# Patient Record
Sex: Female | Born: 1940
Health system: Southern US, Community
[De-identification: ages and names within clinical notes are randomized; demographics above are authoritative.]

## PROBLEM LIST (undated history)

## (undated) DIAGNOSIS — E7800 Pure hypercholesterolemia, unspecified: Secondary | ICD-10-CM

## (undated) DIAGNOSIS — I10 Essential (primary) hypertension: Secondary | ICD-10-CM

## (undated) DIAGNOSIS — E119 Type 2 diabetes mellitus without complications: Secondary | ICD-10-CM

## (undated) HISTORY — DX: Pure hypercholesterolemia, unspecified: E78.00

---

## 2005-12-03 ENCOUNTER — Emergency Department (HOSPITAL_COMMUNITY): Admission: EM | Admit: 2005-12-03 | Discharge: 2005-12-03 | Payer: Self-pay | Admitting: Emergency Medicine

## 2005-12-03 IMAGING — CR DG CHEST 1V PORT
1 series · 1 of 1 positions shown · non-contrast
Comparison: None

CLINICAL DATA: Chest pain, shortness of breath

PORTABLE CHEST - 1 VIEW:

[view not recorded]
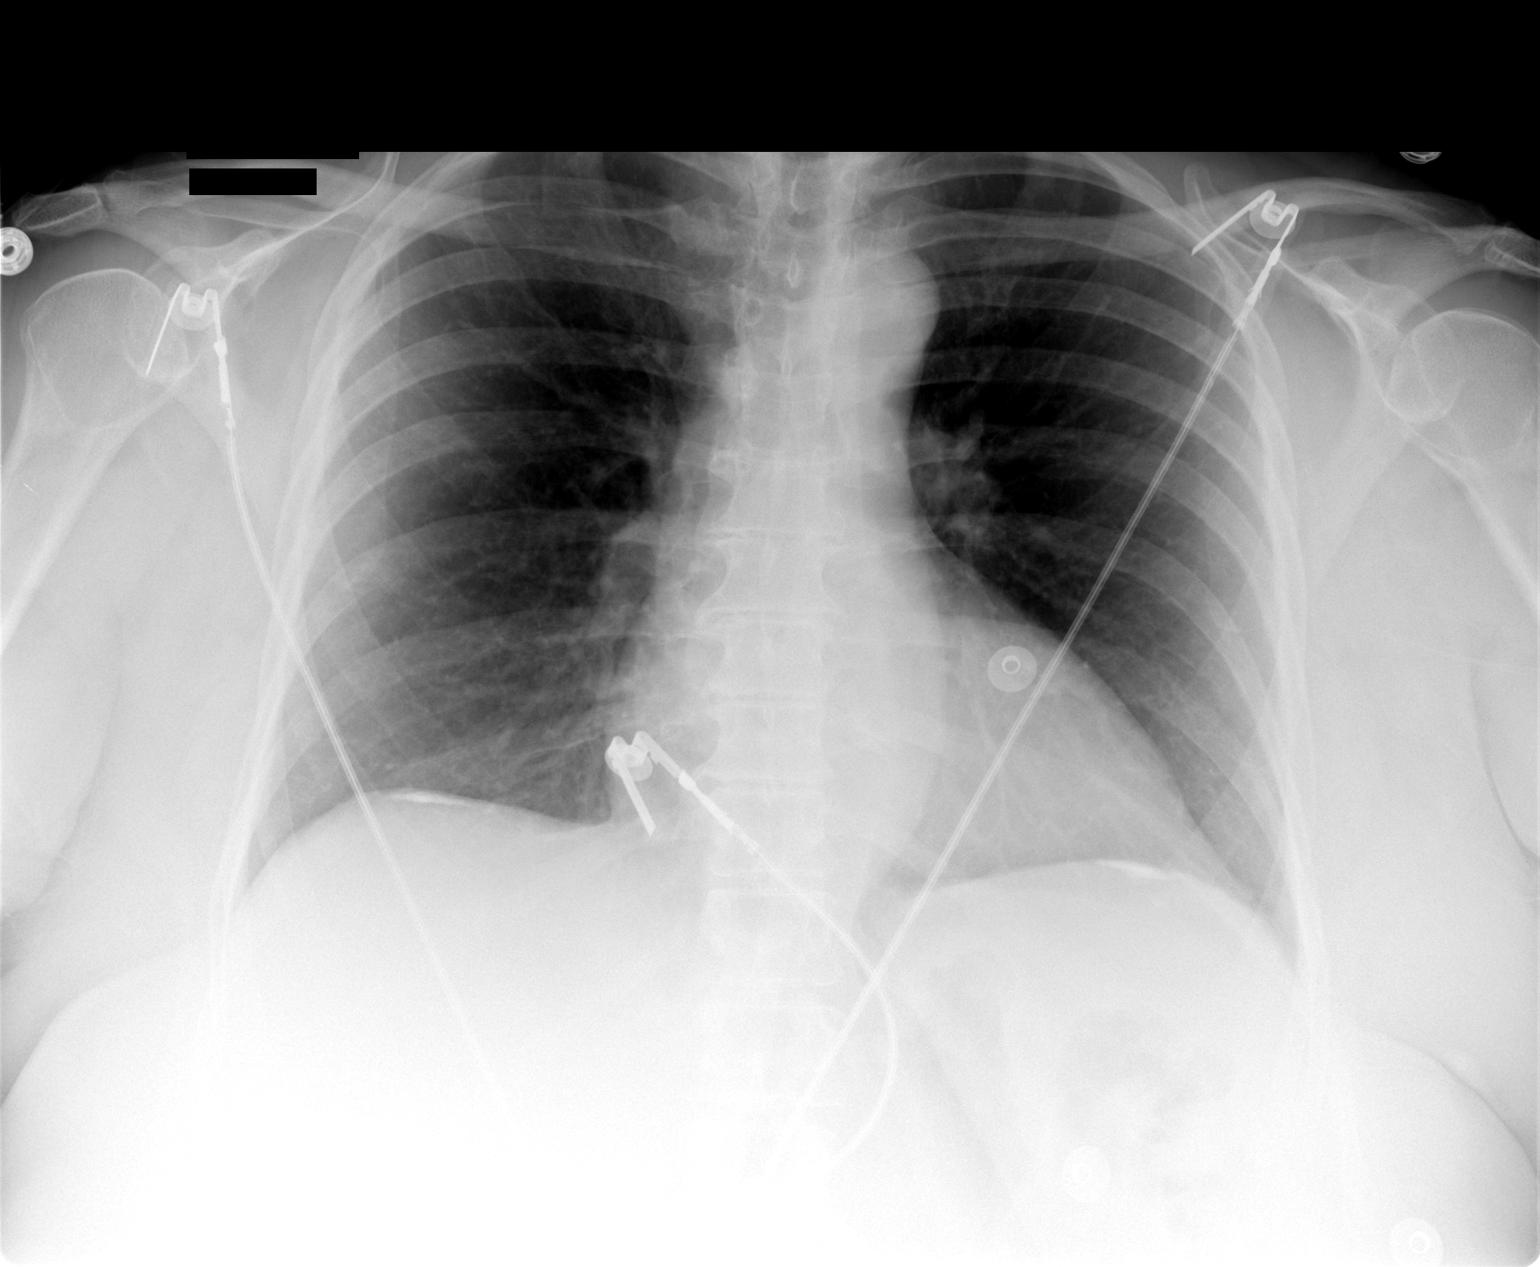

[1 of 1 positions shown; findings below may reference images not displayed]

FINDINGS: Heart and mediastinal contours are within normal limits. Calcified
pleural plaques noted along the hemidiaphragms bilaterally suggest the
possibility of prior asbestos exposure. Lungs are clear. No effusions.
IMPRESSION: Calcified pleural plaques bilaterally suggesting asbestos related pleural
disease. No acute findings.

## 2005-12-16 ENCOUNTER — Ambulatory Visit: Payer: Self-pay | Admitting: Internal Medicine

## 2006-01-13 ENCOUNTER — Ambulatory Visit: Payer: Self-pay | Admitting: Internal Medicine

## 2006-01-27 ENCOUNTER — Ambulatory Visit: Payer: Self-pay | Admitting: Cardiology

## 2006-01-27 IMAGING — CT CT CHEST W/ CM
2 of 4 series · 15 of 36 positions shown, 18 images · IV contrast (omnipaque)
Comparison: none

CLINICAL DATA: Asbestos exposure.  Difficulty breathing.  
 CHEST CT WITH CONTRAST:
TECHNIQUE: Multidetector CT imaging of the chest was performed following the standard protocol during bolus administration of intravenous contrast.
 Contrast:  80 cc Omnipaque 300 injected at 3 cc/second.

[Series 2: chest_rout_xxl 5.0 b31f st · axial · 0.72mm/px · z∈[-252,-46]mm · 12 of 49 slices shown, 15 images]
[im 4/49  mediastinal]
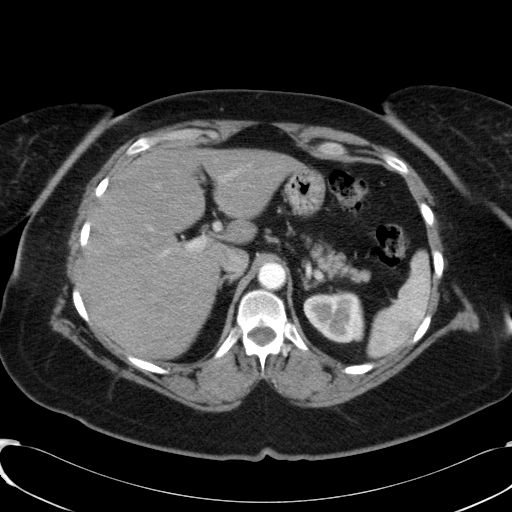
[im 4/49  lung]
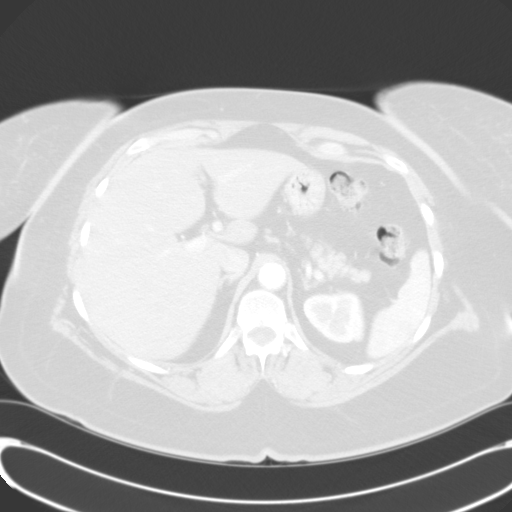
[im 8/49  lung]
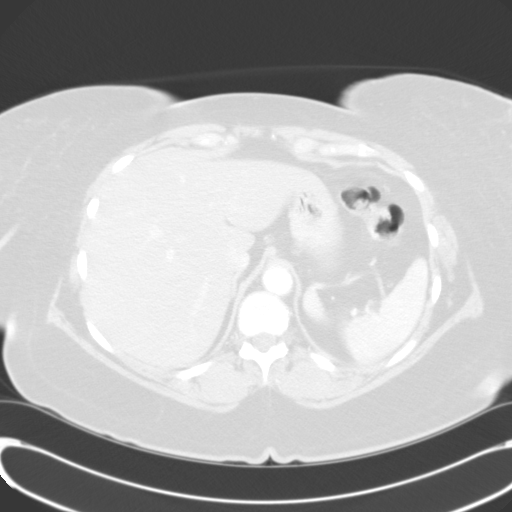
[im 12/49  lung]
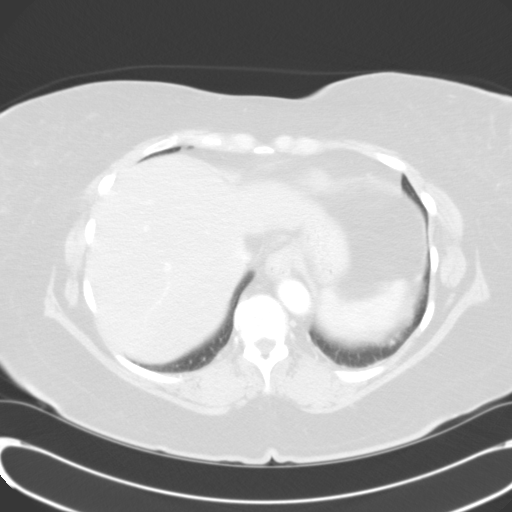
[im 15/49  lung]
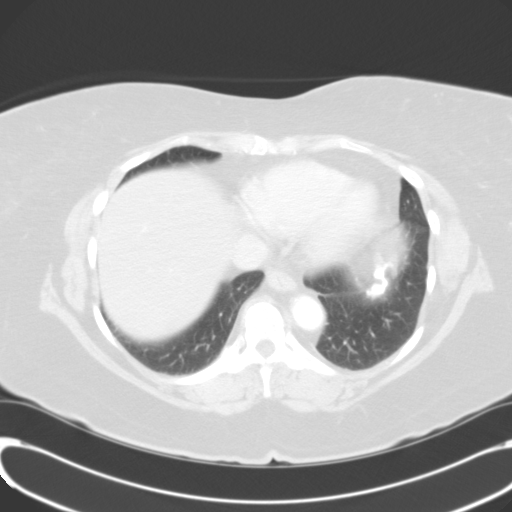
[im 19/49  mediastinal]
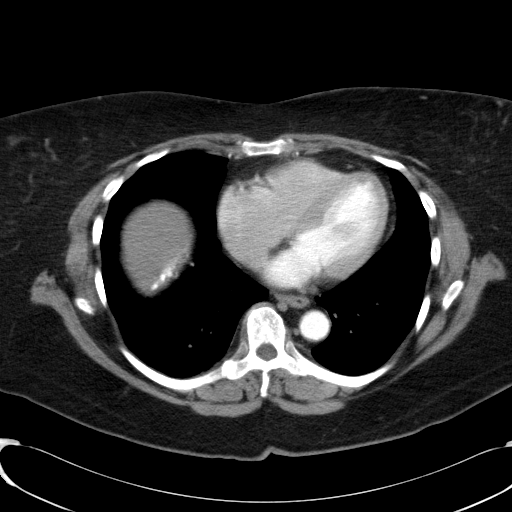
[im 19/49  lung]
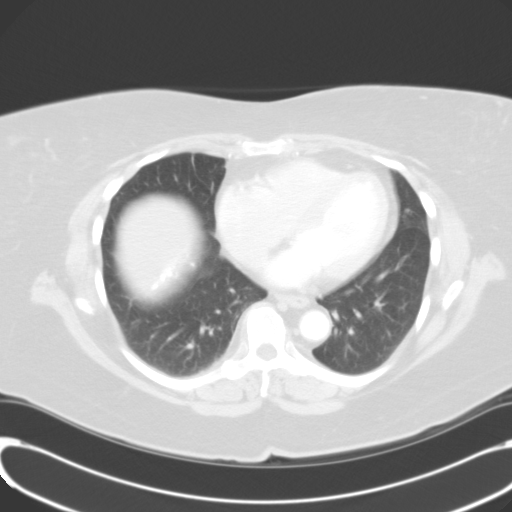
[im 23/49  lung]
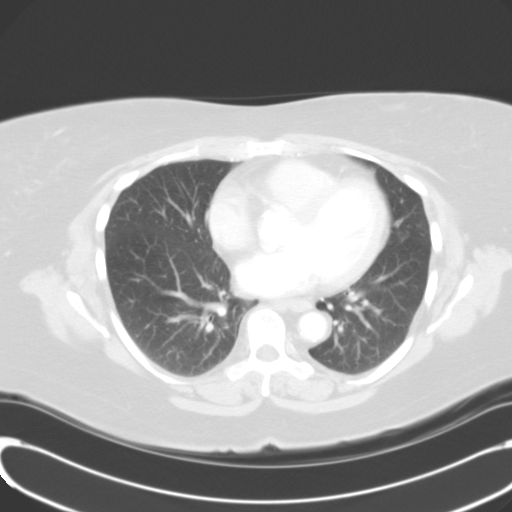
[im 26/49  lung]
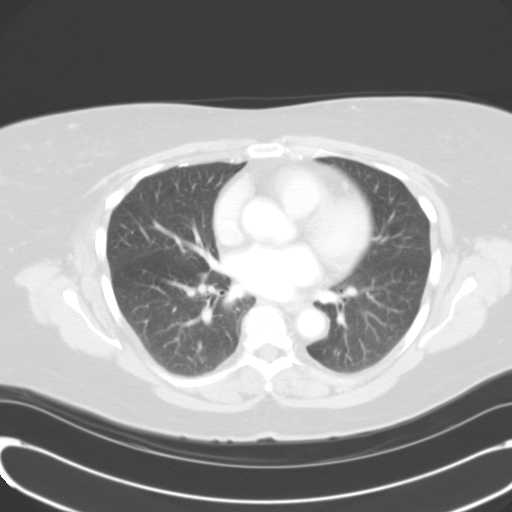
[im 30/49  lung]
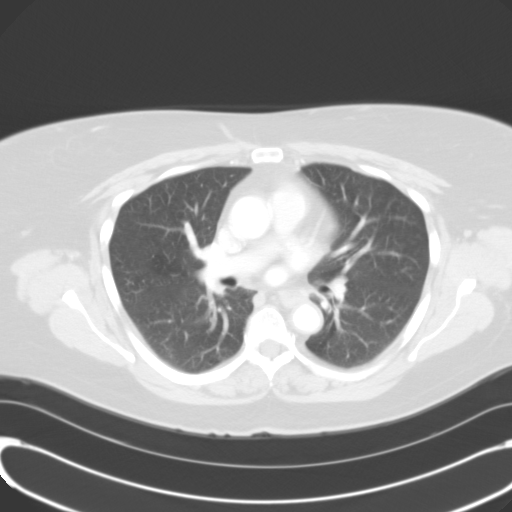
[im 34/49  mediastinal]
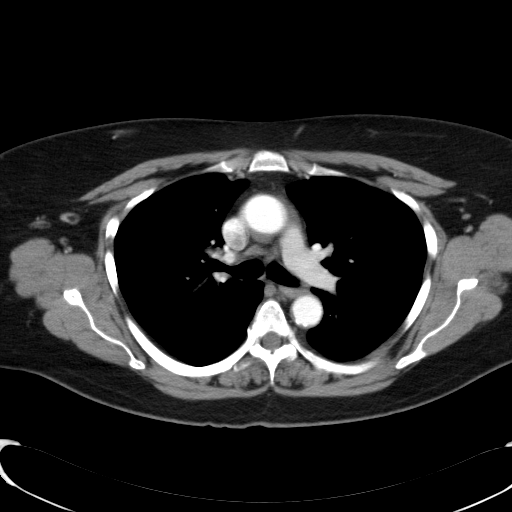
[im 34/49  lung]
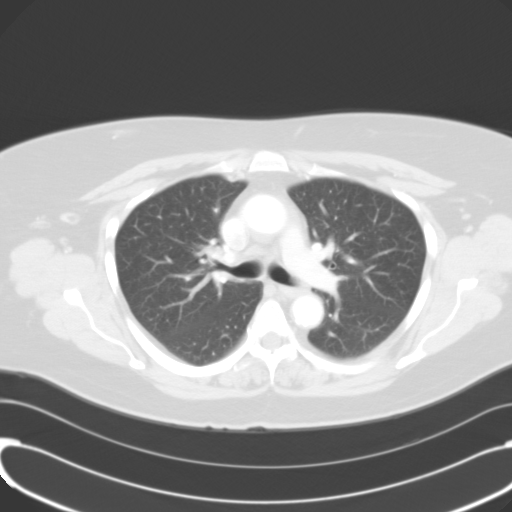
[im 37/49  lung]
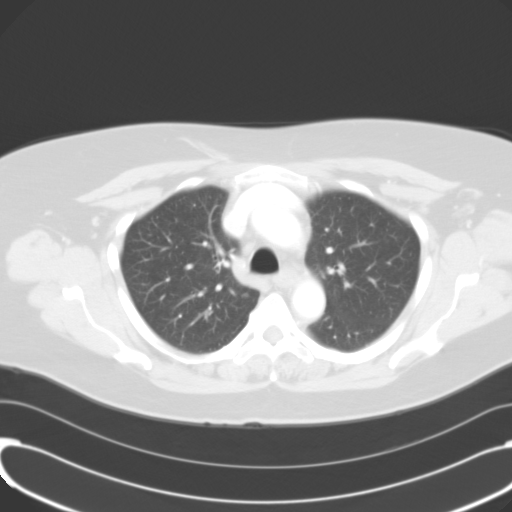
[im 41/49  lung]
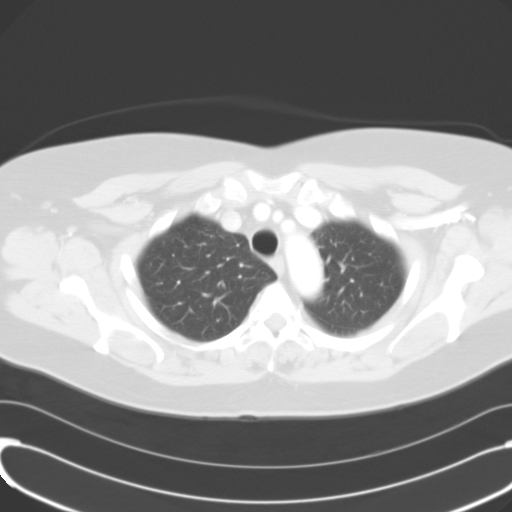
[im 45/49  lung]
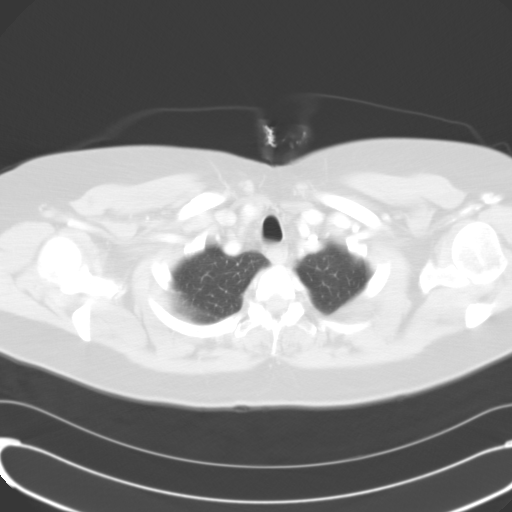

[Series 602: <mpr range> · coronal · 0.72mm/px · 3 of 39 slices shown]
[im 8/39  lung]
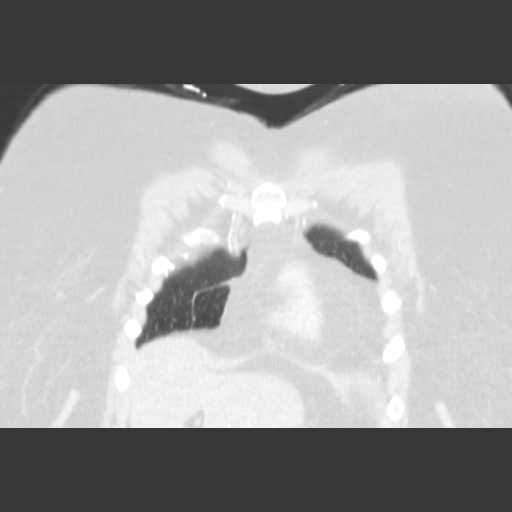
[im 16/39  lung]
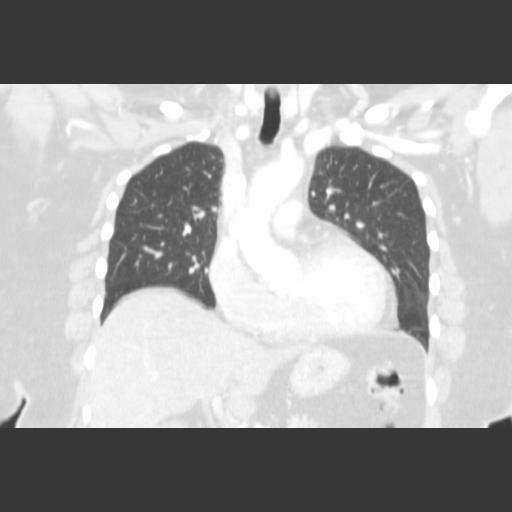
[im 23/39  lung]
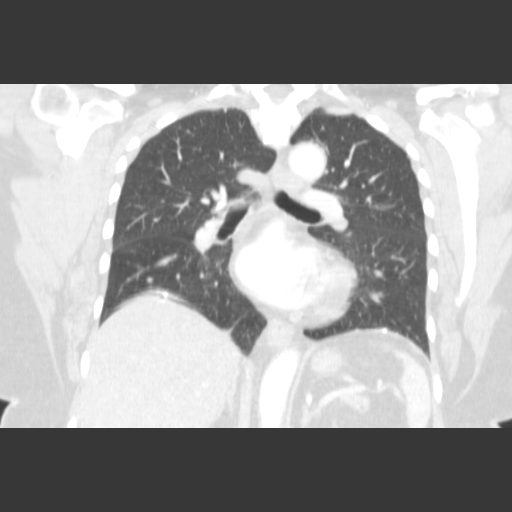

[15 of 36 positions shown; findings below may reference images not displayed]

FINDINGS: There are bibasilar partially calcified pleural plaques noted.  Interstitial fibrosis is not present.  There is no mediastinal or hilar adenopathy and there is no evidence for parenchymal pulmonary mass or infiltrate.  There are no pleural effusions.  The central airways are patent.  The chest wall structures and adrenal glands have normal appearance.
IMPRESSION: Bibasilar partially calcified pleural plaques consistent with previous asbestos exposure.  Otherwise, negative study.

## 2007-03-23 ENCOUNTER — Ambulatory Visit (HOSPITAL_COMMUNITY): Admission: RE | Admit: 2007-03-23 | Discharge: 2007-03-23 | Payer: Self-pay | Admitting: Cardiology

## 2007-03-23 IMAGING — CT CT ANGIO ABDOMEN
1 of 6 series · 14 of 46 positions shown, 19 images · IV contrast (APPLIED)
Comparison: none

CLINICAL DATA: Hypertension. Premedicated due to remote history of contrast
allergy

[Series 5: abd cta 2.0 (id) · axial · 0.79mm/px · z∈[-314,-118]mm · 14 of 112 slices shown, 19 images]
[im 7/112  soft-tissue]
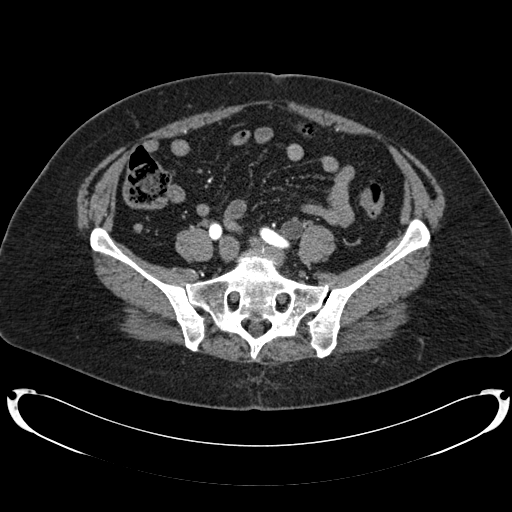
[im 7/112  bone]
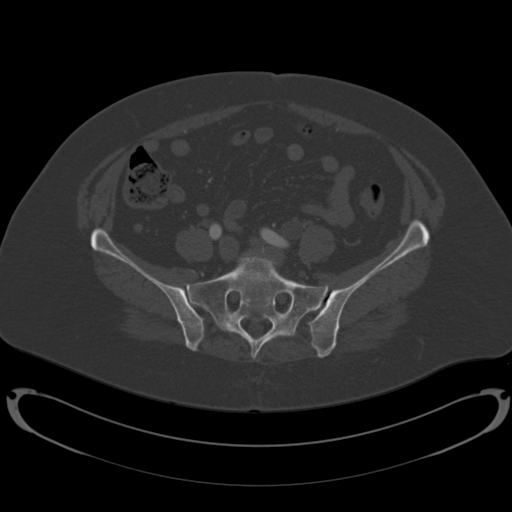
[im 14/112  soft-tissue]
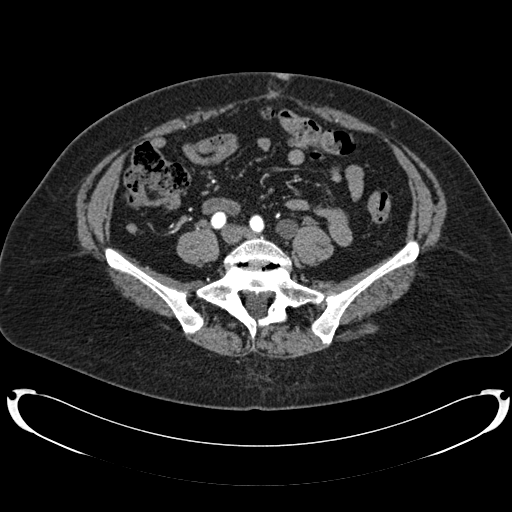
[im 27/112  soft-tissue]
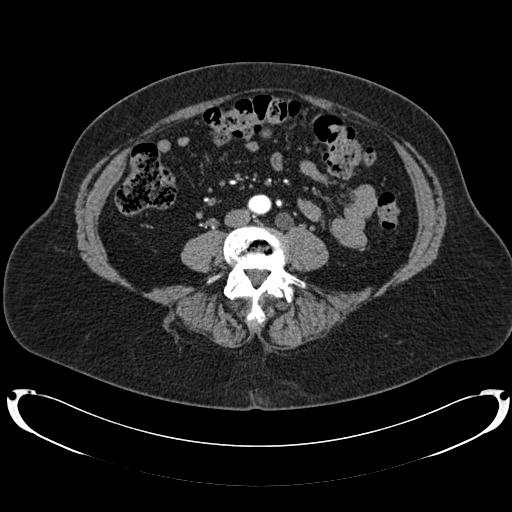
[im 33/112  soft-tissue]
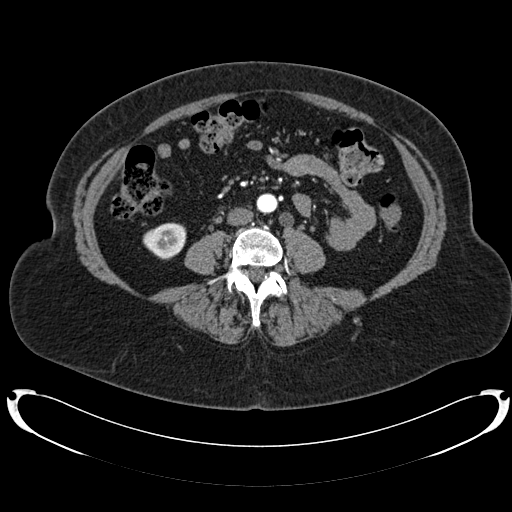
[im 40/112  soft-tissue]
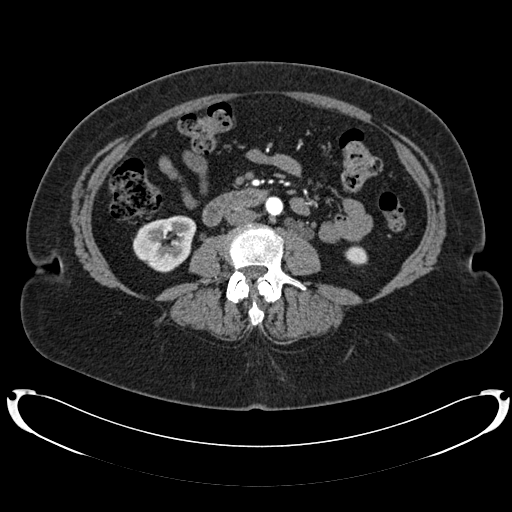
[im 46/112  soft-tissue]
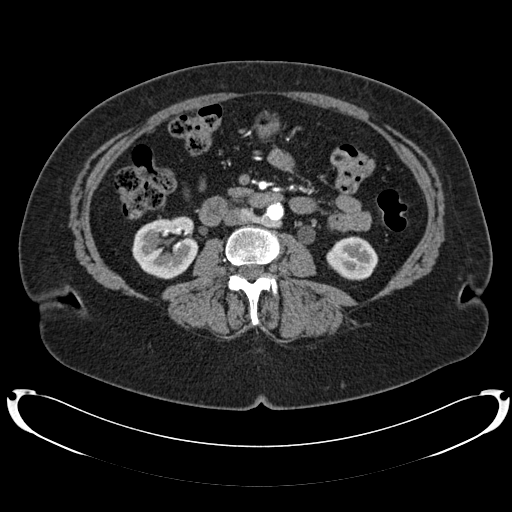
[im 59/112  soft-tissue]
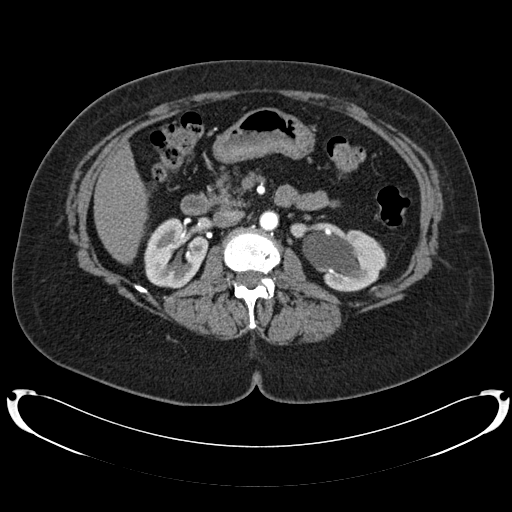
[im 66/112  soft-tissue]
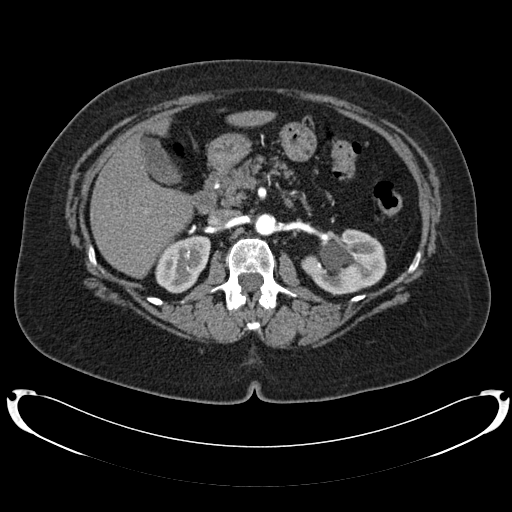
[im 72/112  soft-tissue]
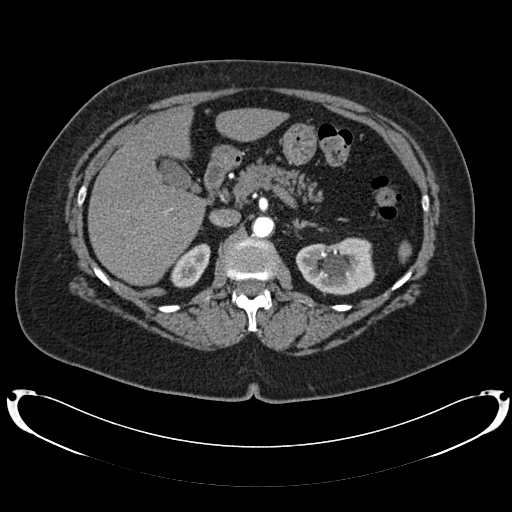
[im 72/112  bone]
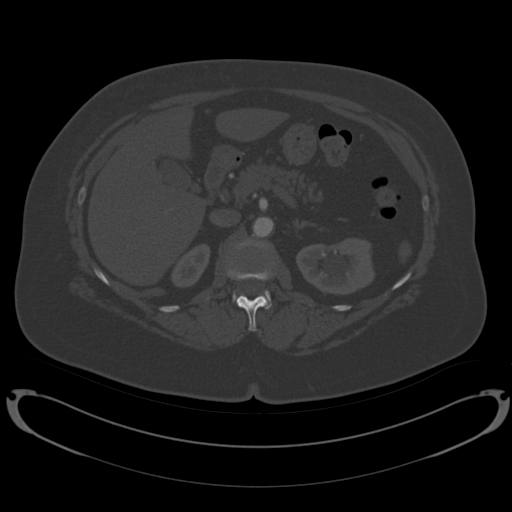
[im 79/112  soft-tissue]
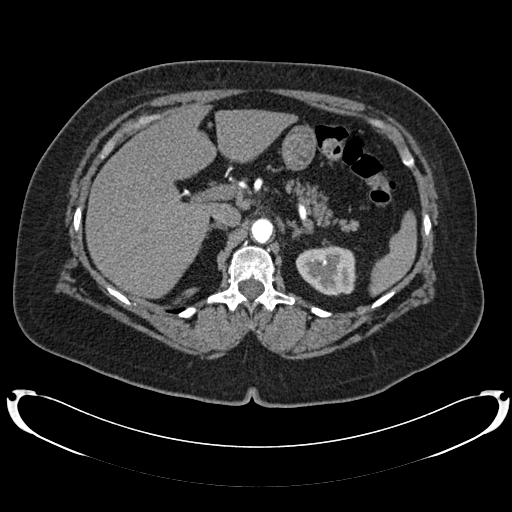
[im 85/112  soft-tissue]
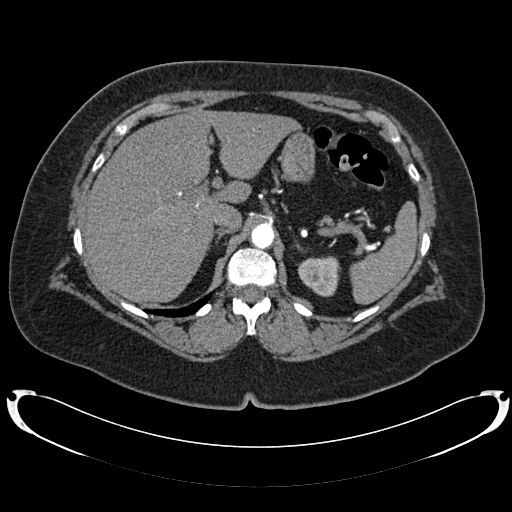
[im 85/112  lung]
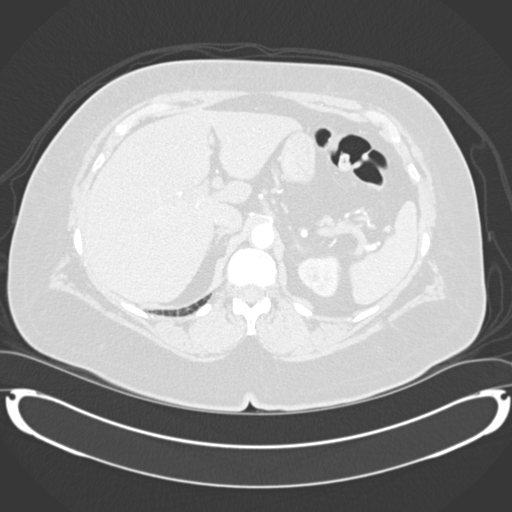
[im 92/112  lung]
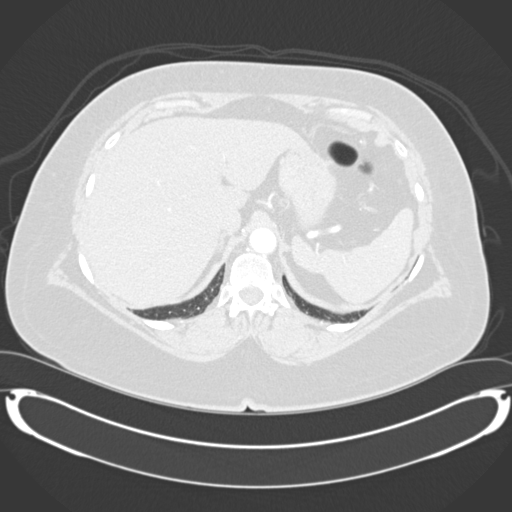
[im 98/112  soft-tissue]
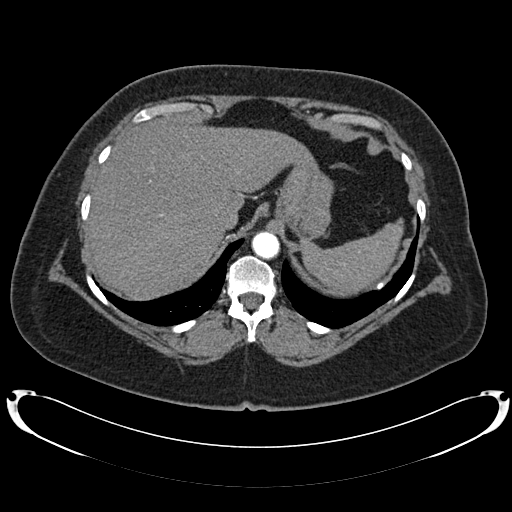
[im 98/112  lung]
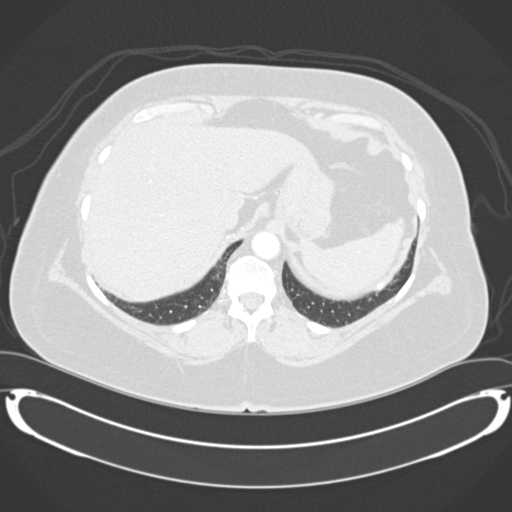
[im 105/112  soft-tissue]
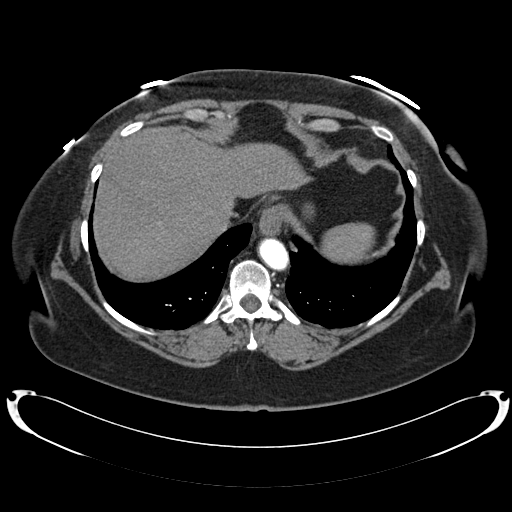
[im 105/112  lung]
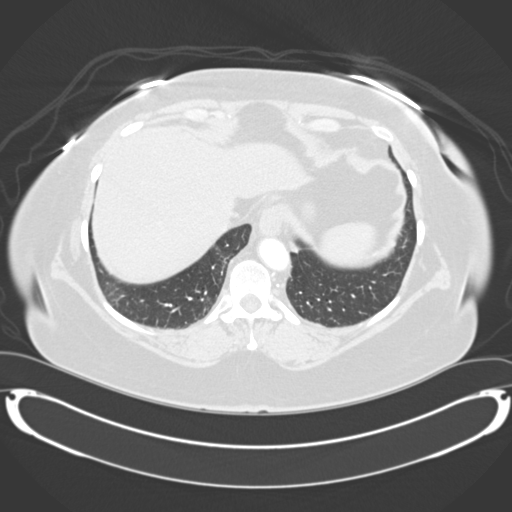

[14 of 46 positions shown; findings below may reference images not displayed]

CT angiogram abdomen with contrast:

Multidetector helical CT of the abdomen  was obtained after 100 ml Omnipaque 350
IV. CT multiplanar reconstructions were rendered to evaluate the vascular
anatomy.
No previous for comparison. Mild atheromatous irregularity of the abdominal
aorta without dissection, stenosis, or aneurysm. Celiac axis and SMA widely
patent. Replaced right hepatic arterial supply from the SMA, an anatomic
variant. There is a single left renal artery, with partially calcified ostial
plaque resulting in less than 50% diameter stenosis over a short segment. Single
right renal artery, with partially calcified osteal plaque resulting in
approximately 50% diameter stenosis over a short segment with no post stenotic
dilatation. The distal aspect of the renal arteries are unremarkable
bilaterally. Inferior mesenteric artery remains patent. The proximal visualized
portions of common iliac arteries are unremarkable.

Pleural plaque is noted on the left   hemidiaphragm. Visualized lung bases
otherwise clear. Unremarkable arterial phase evaluation of visualized portions
of liver, spleen, pancreas, gallbladder, adrenal glands. There is moderate left
hydronephrosis and marked proximal left ureterectasis, the distal ureter not
included on the scan. Visualized portions of small bowel and colon are
decompressed, unremarkable. Retroaortic left renal vein is incidentally noted,
an anatomic variant. No free air. No ascites. No adenopathy. Degenerative
changes in the lumbar spine.
IMPRESSION: 1. Bilateral ostial renal artery plaque resulting in less than 50% diameter
stenosis, of questionable hemodynamic significance.
2. Left hydronephrosis and proximal ureterectasis, distal extent not evaluated.
Consider CT pelvis to exclude a distal obstructing stone or mass.

## 2007-04-15 ENCOUNTER — Ambulatory Visit (HOSPITAL_COMMUNITY): Admission: RE | Admit: 2007-04-15 | Discharge: 2007-04-15 | Payer: Self-pay | Admitting: Cardiology

## 2007-04-15 IMAGING — CT CT PELVIS W/O CM
1 of 3 series · 12 of 32 positions shown, 18 images · IV contrast (agent unspecified)
Comparison: The patient has a CT of the abdomen on [DATE] which revealed left hydronephrosis and proximal ureterectasis.  However, that exam did not include the distal aspect of the left ureter.

CLINICAL DATA: Left hydronephrosis, hematuria.  Evaluate for source such as distal stone or mass.  
PELVIS CT WITHOUT CONTRAST:
TECHNIQUE: Multidetector CT imaging of the pelvis was performed following the standard protocol without IV contrast.

[Series 400: reformatted · sagittal · 0.98mm/px · 12 of 100 slices shown, 18 images]
[im 8/100  soft-tissue]
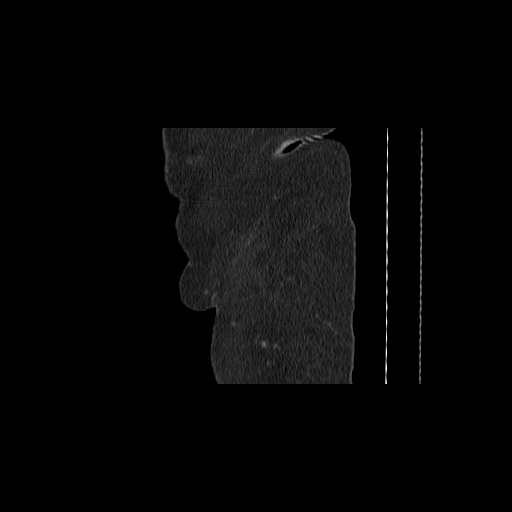
[im 8/100  lung]
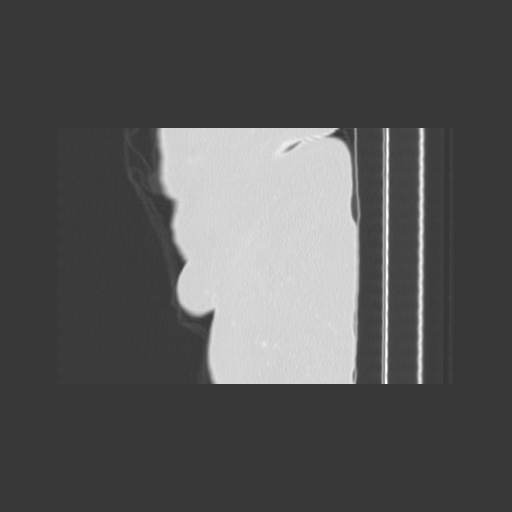
[im 8/100  bone]
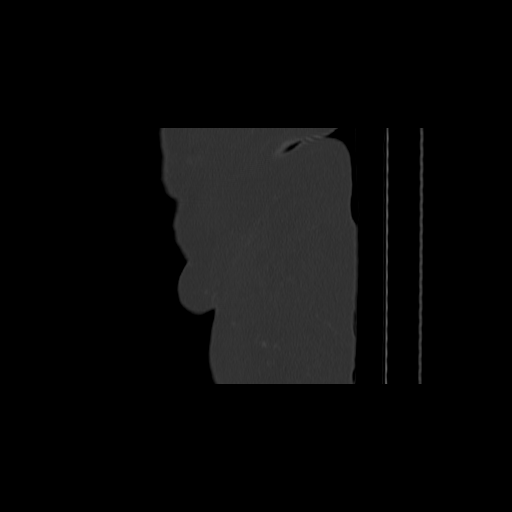
[im 16/100  soft-tissue]
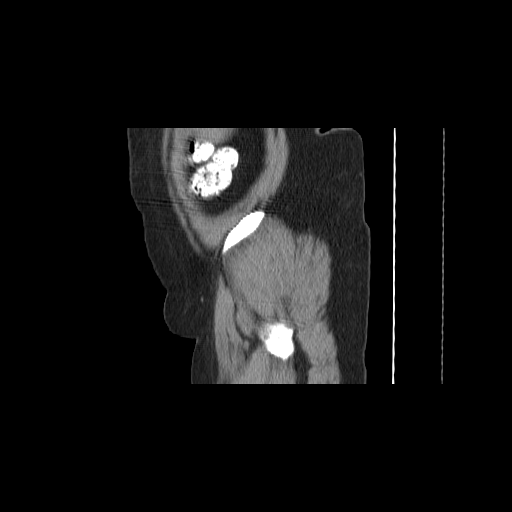
[im 16/100  lung]
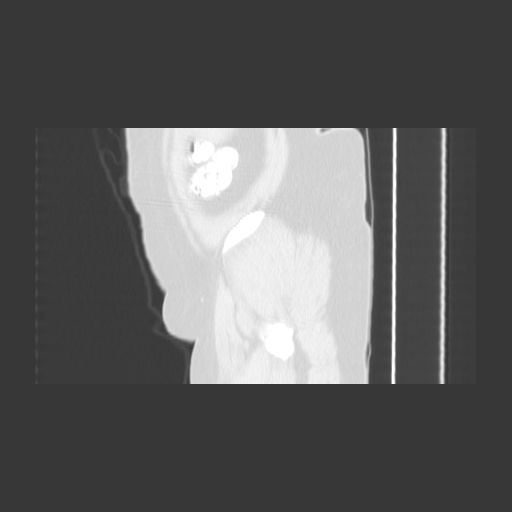
[im 23/100  soft-tissue]
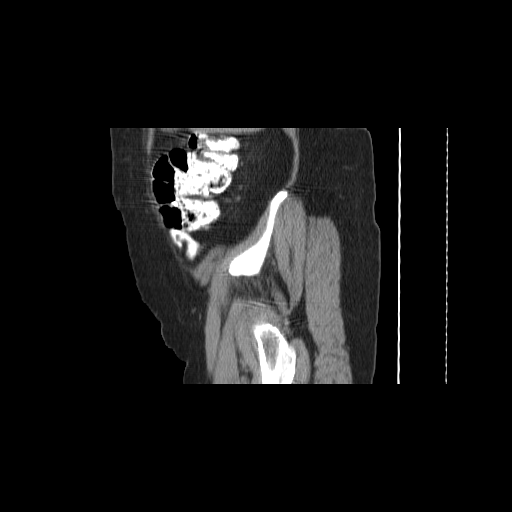
[im 23/100  lung]
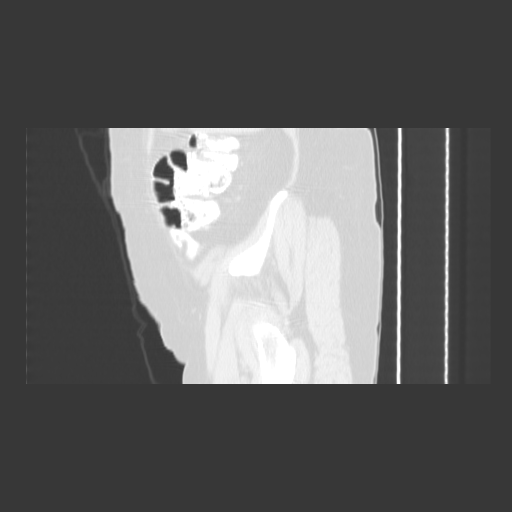
[im 31/100  soft-tissue]
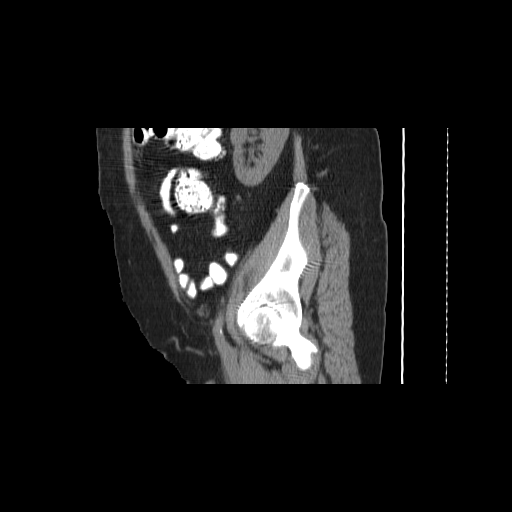
[im 31/100  lung]
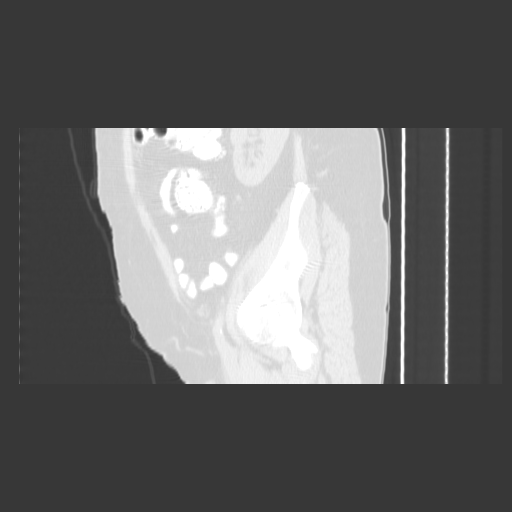
[im 39/100  soft-tissue]
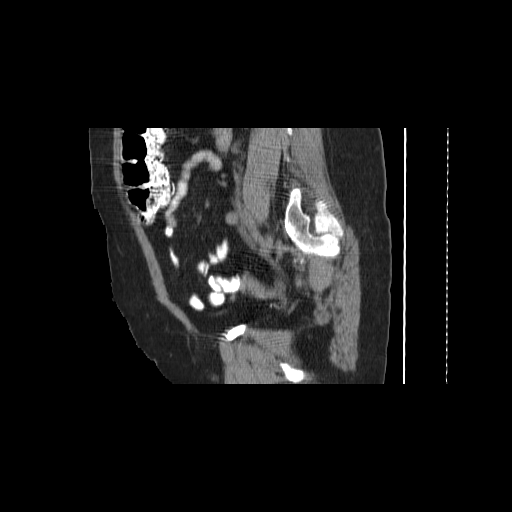
[im 46/100  soft-tissue]
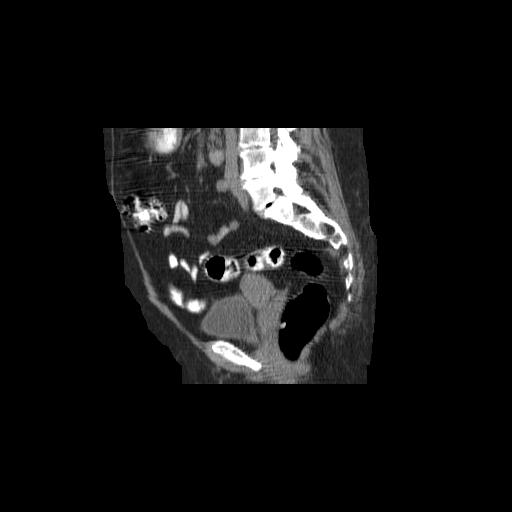
[im 54/100  soft-tissue]
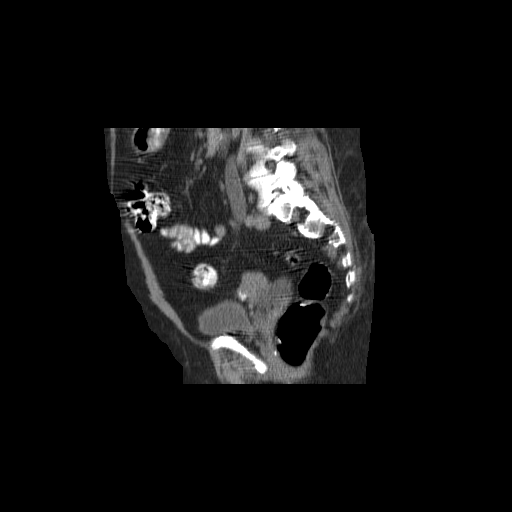
[im 61/100  soft-tissue]
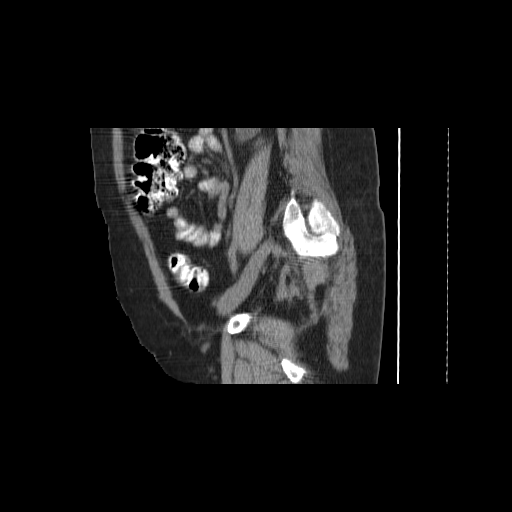
[im 69/100  soft-tissue]
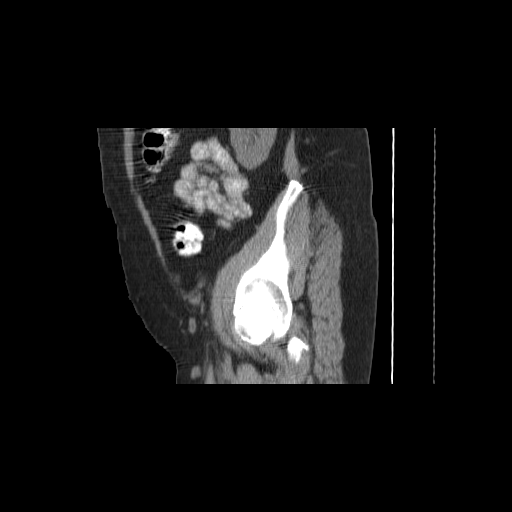
[im 69/100  bone]
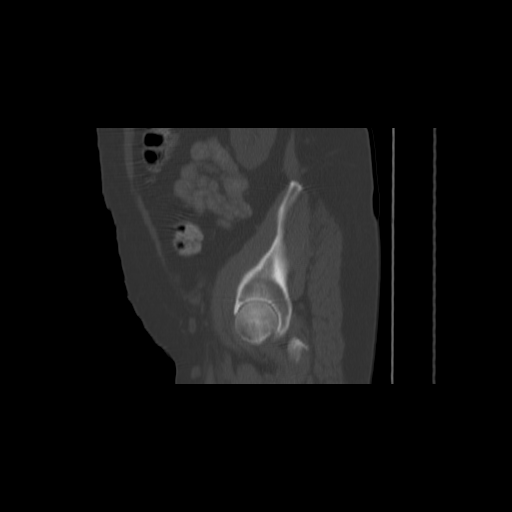
[im 77/100  soft-tissue]
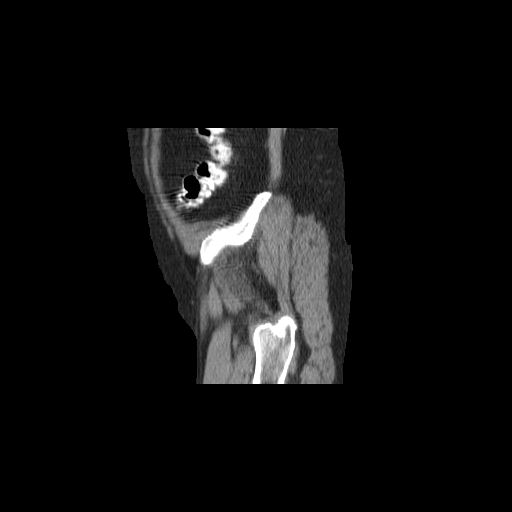
[im 84/100  soft-tissue]
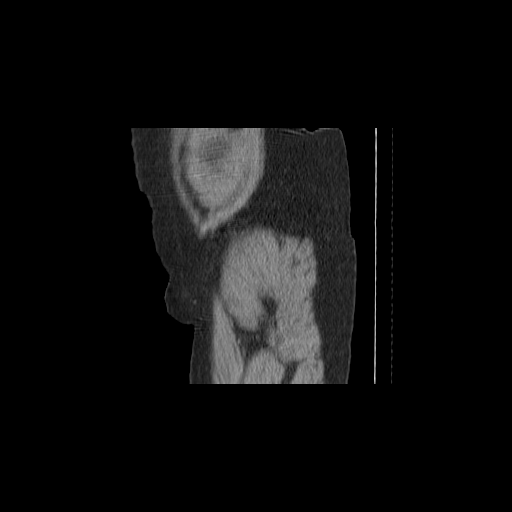
[im 92/100  soft-tissue]
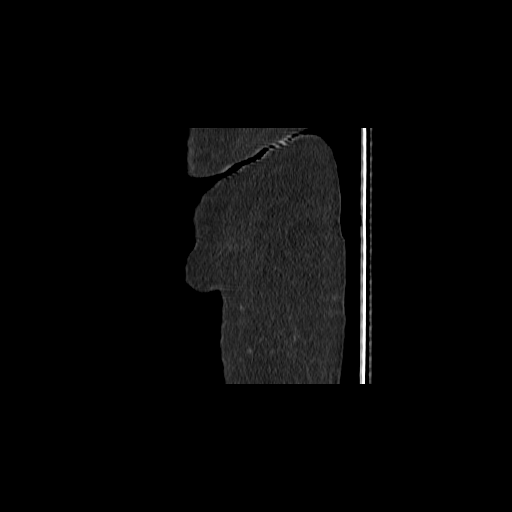

[12 of 32 positions shown; findings below may reference images not displayed]

FINDINGS: This exam reveals marked left hydroureteronephrosis.  The ureter is dilated down to its terminus.  However, I cannot detect an obstructing lesion such as a mass or calculus.  Possibly the patient has a distal left ureteral stenosis.   wall appears diffusely thickened to a mild degree,however, the bladder is not fully distended, which may simulate thickening.  Does the patient have a history of chronic cystitis?  Small calcified fibroid in the left anterior uterus.  No pelvic mass or pathologically-enlarged lymph nodes.  
Incidentally, the patient has a left retroaortic renal vein which is a normal anatomic variant.
IMPRESSION: Left hydroureteronephrosis.  Rather marked degree of left hydroureter.  The etiology is indeterminate.  No evidence of a distally obstructing stone or mass.  Query distal ureteral stenosis.

## 2007-05-09 ENCOUNTER — Encounter: Admission: RE | Admit: 2007-05-09 | Discharge: 2007-05-09 | Payer: Self-pay | Admitting: Urology

## 2007-05-09 IMAGING — CR DG CHEST 2V
2 series · 2 of 2 positions shown · non-contrast
Comparison: Chest of [DATE].

CLINICAL DATA: Pre-op for surgery for kidney carcinoma. 
 CHEST ? 2 VIEW:

[view not recorded (1 of 2)]
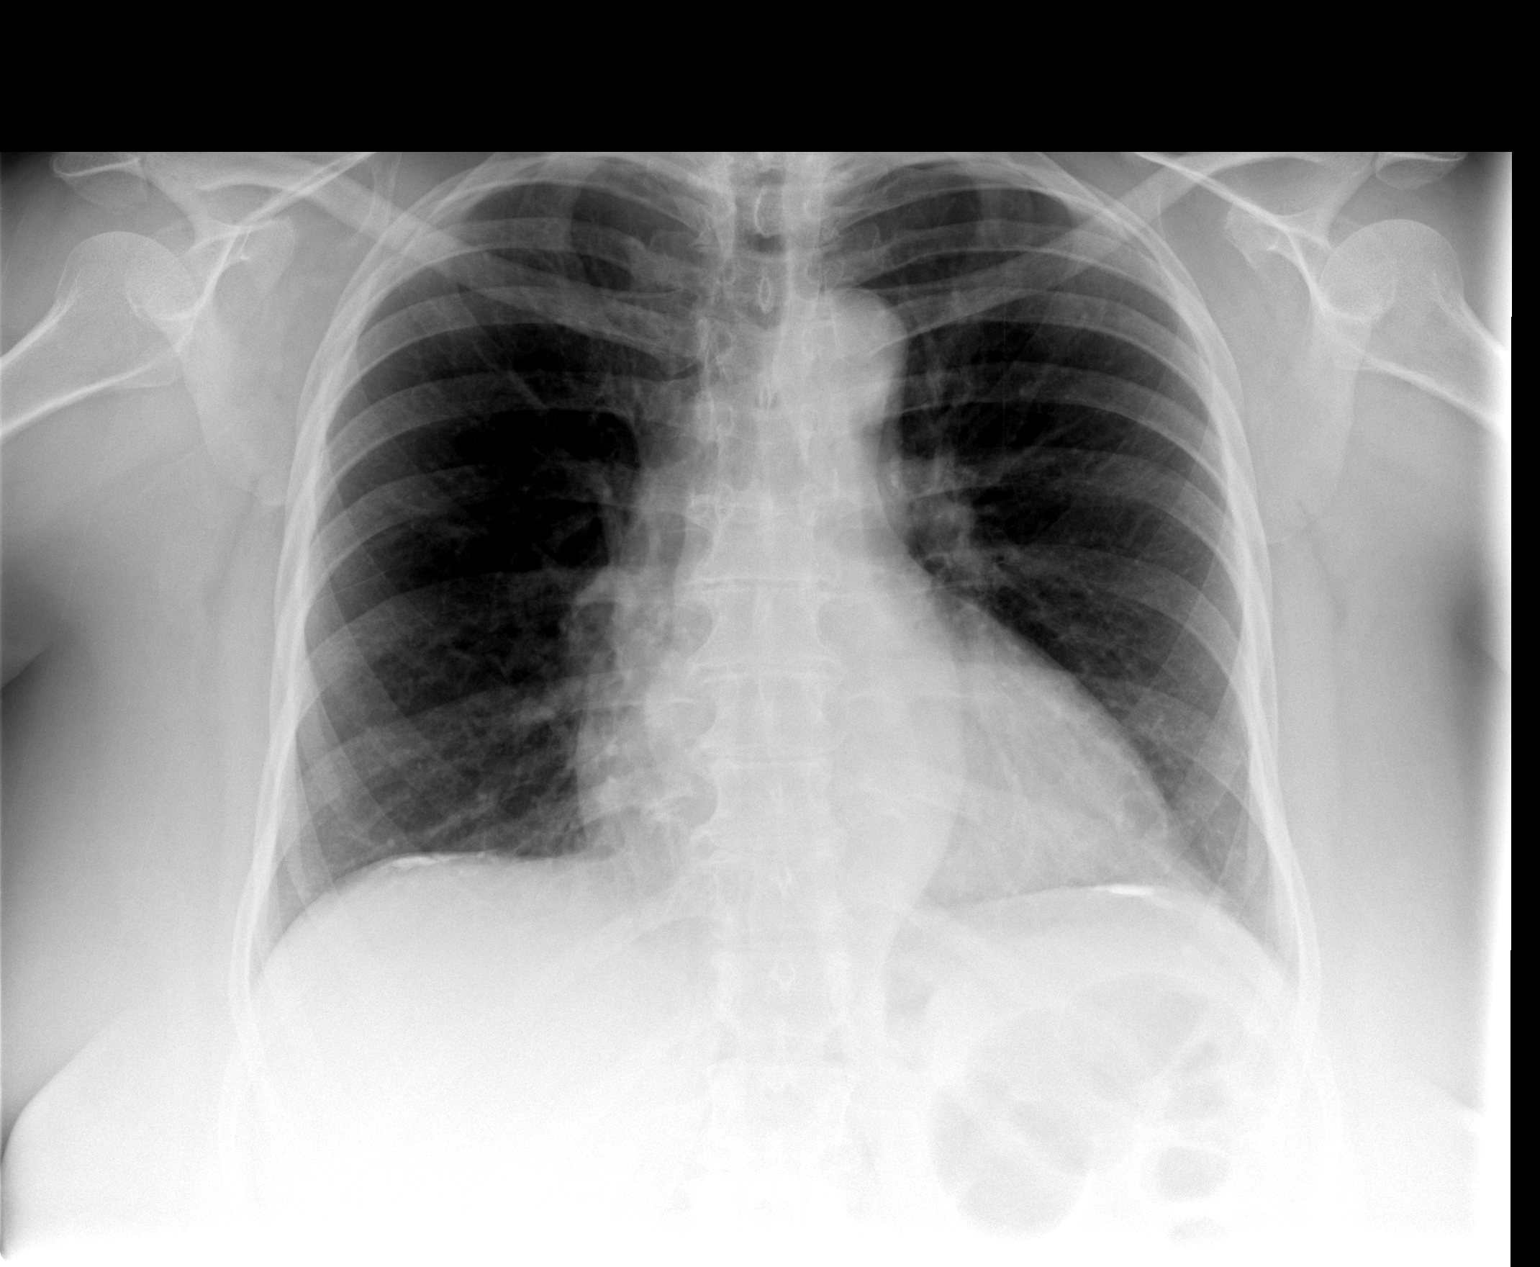

[view not recorded (2 of 2)]
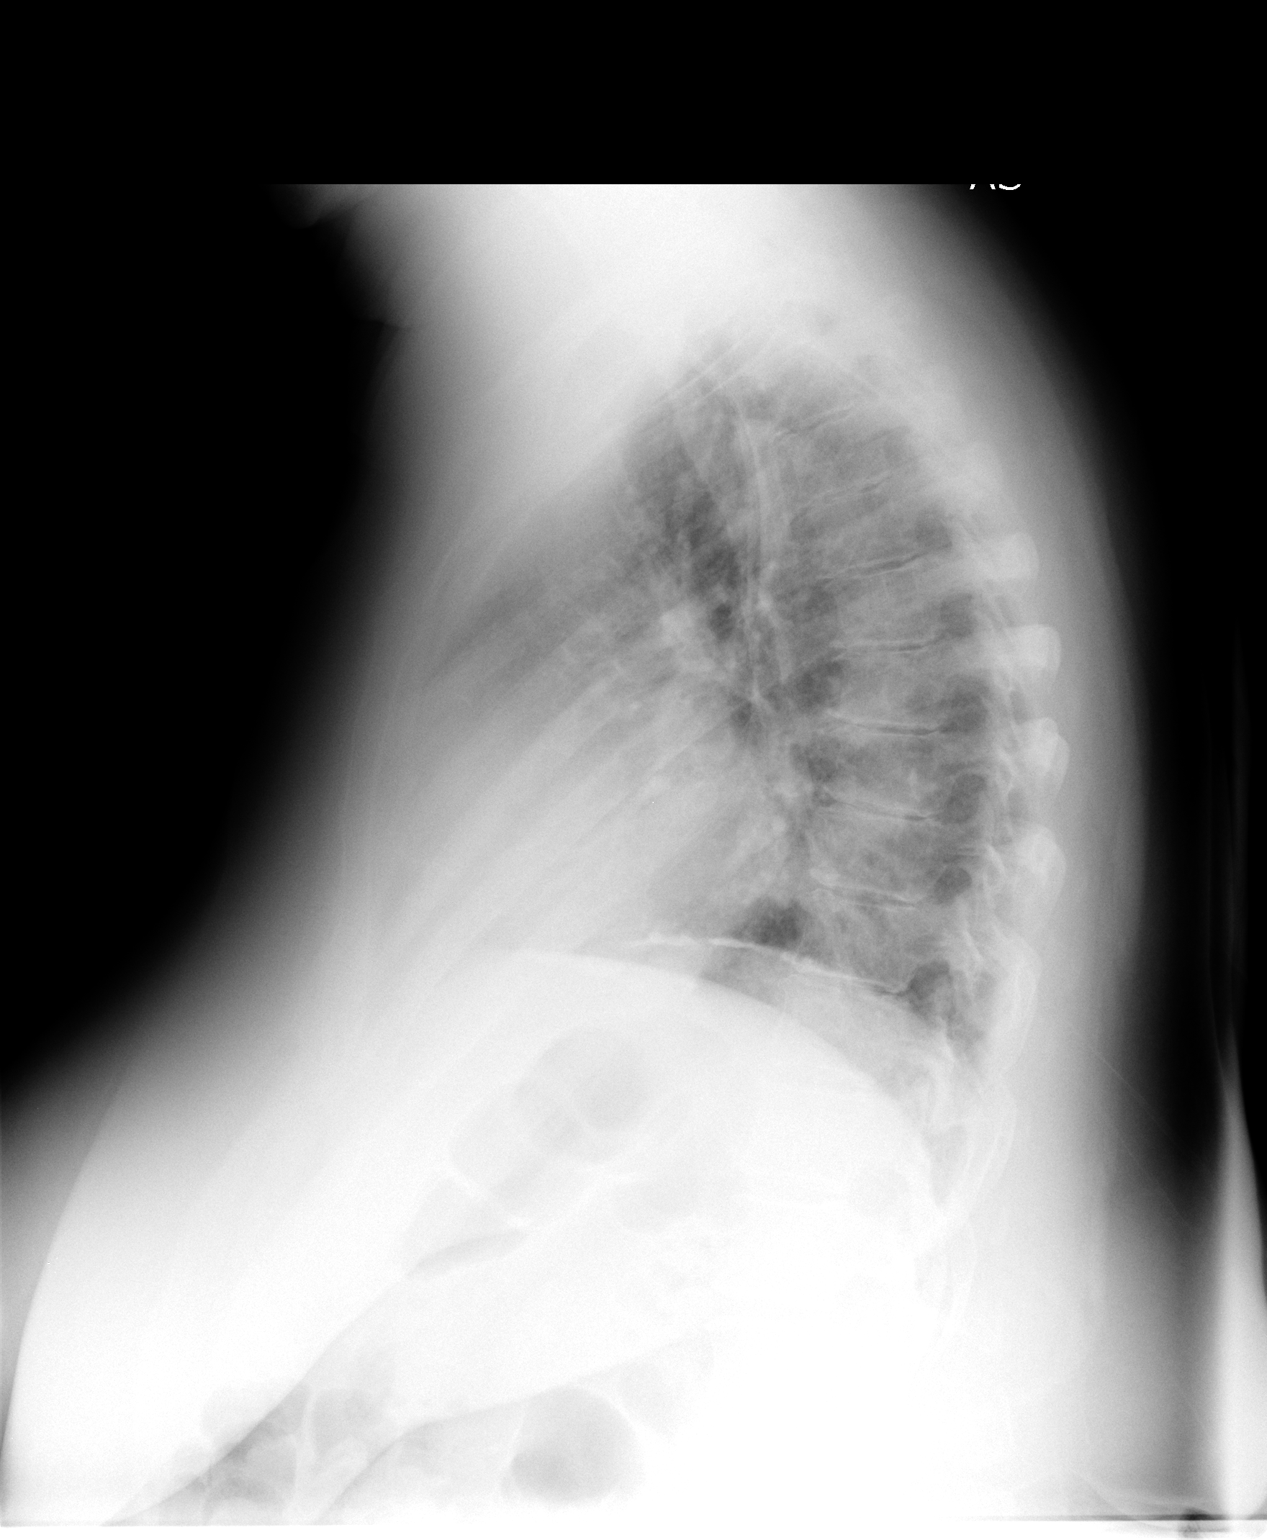

[2 of 2 positions shown; findings below may reference images not displayed]

FINDINGS: Two views of the chest show no active infiltrate or effusion.  Calcified hemidiaphragm suggests asbestosis related pleural disease.  Mild cardiomegaly is stable.  No bony abnormality is seen.
IMPRESSION: No active lung disease.   Probable changes of asbestosis.  Stable mild cardiomegaly.

## 2007-05-13 ENCOUNTER — Ambulatory Visit (HOSPITAL_BASED_OUTPATIENT_CLINIC_OR_DEPARTMENT_OTHER): Admission: RE | Admit: 2007-05-13 | Discharge: 2007-05-13 | Payer: Self-pay | Admitting: Urology

## 2010-09-23 NOTE — Op Note (Signed)
NAME:  Heather [name], Heather [name]                ACCOUNT NO.:  [id number]   MEDICAL RECORD NO.:  [id number]          PATIENT TYPE:  AMB   LOCATION:  [facility]                         FACILITY:  [facility]   PHYSICIAN:  Ronald L. Earlene Plater, M.D.  DATE OF BIRTH:  [birth year]   DATE OF PROCEDURE:  05/13/2007  DATE OF DISCHARGE:                               OPERATIVE REPORT   PREOPERATIVE DIAGNOSIS:  Left hydroureteronephrosis.   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   OPERATIVE PROCEDURES:  1. Cystourethroscopy.  2. Left retrograde pyelogram.  3. Balloon dilatation of left ureterovesical junction.  4. Left ureteroscopy.  5. Placement left double-J stent.   SURGEON:  Lucrezia Starch. Earlene Plater, MD   ANESTHESIA:  LMA.   ESTIMATED BLOOD LOSS:  Negligible.   TUBES:  24-cm 8-French contour double pigtail stent with a pullout  string.   COMPLICATIONS:  None.   INDICATION FOR PROCEDURE:  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  After understanding  risks, benefits and alternatives, they have elected to proceed with the  above procedure.   PROCEDURE IN DETAIL:  The patient was placed in a supine position, after  proper LMA anesthesia was placed in the dorsal lithotomy position,  prepped and draped with Betadine in a sterile fashion.  Cystourethroscopy was performed with a 22.5-French Olympus panendoscope.  The bladder was carefully inspected and noted be without lesions.  Both  ureteral orifices were clearly seen.  The left one appeared to be  somewhat pinhole.  Utilizing an 8-French cone-tip catheter, a left  retrograde ureteropyelogram was performed.  The ureterovesical junction  was noted to be very stenotic and just above it, the  ureter was  significantly dilated throughout its course.  Under fluoroscopic  guidance and direct vision, a 0.038-French sensor wire was placed into  the left renal pelvis and the distal ureter was dilated to 9 atmospheres  of pressure for 2 minutes with a balloon dilator and the narrowed area  at the ureterovesical junction was seen to open.  Following this,  ureteroscopy was performed with a short thin semi-flexible ureteroscope.  The lower two-thirds ureter was examined.  The area of the  ureterovesical junction was noted be widely dilated.  It had been  dilated to 24-French, which was smaller than the diameter of the ureter,  and no lesions were noted to be in the ureter although it was massively  dilated.  Under fluoroscopic guidance a 24-cm 8-French contour double  pigtail stent was placed with a string, noted to be in good position  within the left renal pelvis and within the bladder.  The  bladder was drained and the patient was taken to the recovery room  stable.  Plan will be to remove the stent in a few days in follow-up.  She may have reflux but with this tight, stenotic ureterovesical  junction it may help her  kidney drain better.      Ronald L. Earlene Plater, M.D.  Electronically Signed     RLD/MEDQ  D:  05/13/2007  T:  05/13/2007  Job:  [id number]

## 2010-09-26 NOTE — Assessment & Plan Note (Signed)
[facility]                               PULMONARY OFFICE NOTE   Heather [name], Heather [name]                       MRN:          [id number]  DATE:12/16/2005                            DOB:          04-30-1941    HISTORY:  This is a 70 year old white female, never a smoker, who grew up in  a house with smokers, one of whom worked in the shipyards in [place] and came  home nightly with heavy dust on his clothes (her father).  She had no  respiratory complaints until about 4-5 months ago with the sensation of  chest tightness associated with dry cough that occurs mostly when she is  supine, better when she sits, and actually better also walking.  No overt  reflux or sinus complaints.  She thought this was, asthma (see comments  below) and does feel slightly better when she uses albuterol but not  completely back to baseline.  She denies any exertional chest pain, classic  orthopnea, PND, or leg swelling.   PAST MEDICAL HISTORY:  Significant for obesity, hypertension, and  hyperlipidemia.  She was diagnosed with asthma 20 years ago but on the  average uses albuterol les than twice monthly.   ALLERGIES:  CODEINE.   MEDICATIONS:  Sular.  Hydrochlorothiazide.  Magnesium.  She does not remember being placed on ACE inhibitors.   SOCIAL HISTORY:  She has never smoked cigarettes.  She denies any unusual  travel, pet, or hobby exposure.   FAMILY HISTORY:  Significant for emphysema in her father as noted who also  had lung cancer and asbestos exposure.   REVIEW OF SYSTEMS:  Updated and reviewed on the worksheet, significant for  the problems as outline above.   PHYSICAL EXAMINATION:  GENERAL:  This is a pleasant, ambulatory, obese,  white female of relatively short stature, in no acute distress.  She is  somewhat anxious.  HEENT:  Unremarkable.  Pharynx is clear.  Nasal turbinates normal.  Dentition intact.  Ear canals clear bilaterally.  NECK:  Supple  without cervical adenopathy or tenderness.  Trachea is  midline.  No thyromegaly.  LUNG FIELDS:  Perfectly clear bilaterally to auscultation and percussion  with no crackles on inspiration or wheezes on expiration.  HEART:  Regular rate and rhythm without murmur, gallop, or rub.  ABDOMEN:  Soft, benign.  EXTREMITIES:  Warm without calf tenderness, cyanosis, clubbing, or edema.  ER evaluation from December 03, 2005, was reviewed including the fact that she  had normal tropinins and a BNP of less than 30 and normal chemistries.  Chest x-ray from December 03, 2005, showed classic calcified pleural plaques,  otherwise no findings.   IMPRESSION:  1. 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  2. Obesity with restricted physiology.  May also place her at risk of  reflux.  I have recommended Zegerid at bedtime since most of her      symptoms are occurring at bedtime and asked her to try the albuterol      more aggressively to see if it helps her breathing.  3. Since she does not have reproducible symptoms with exertion, I am less      likely to think she has angina but note that she does have difficult to      control hypertension as a major risk factor.   I tried to spend time educating her regarding some of the symptoms to look  for in extra-esophageal reflux which include most of the symptoms she is  describing and emphasized to her response to an empiric trial of PPI therapy  plus lifestyle modification is more important at this point than doing  additional tests.   I would like her to return in six weeks for PFTs, and we will certainly see  her sooner if needed.                                   Charlaine Dalton. Sherene Sires, MD, [facility]   MBW/MedQ  DD:  12/16/2005  DT:  12/16/2005  Job #:   [id number]

## 2010-09-26 NOTE — Assessment & Plan Note (Signed)
[facility]                               PULMONARY OFFICE NOTE   Heather [name], Heather [name]                       MRN:          [id number]  DATE:01/13/2006                            DOB:          08-22-1940    HISTORY:  A 70 year old white female never smoker with a history of asbestos  exposure and evidence of asbestos pleural plaques by plain film.  She comes  in for follow-up evaluation of a 82-month history of paroxysms of dyspnea not  necessarily related with exertion, more frequent at night, associated with a  dry cough and suggesting to me the possibility of reflux.  She reported  previously doing slightly better after using albuterol but actually has  not used it since her previous visit since starting Zegerid 40 mg at  bedtime.  On the other hand she states, I'm no better.  She continues to  have paroxysms of dyspnea that bother her at night and has not yet tried  albuterol for these, which can last up to several hours and waken her from a  sound sleep.   PHYSICAL EXAMINATION:  GENERAL:  She is a tearful, ambulatory white female  who seemed to get somewhat choked up just trying to tell her story in  front of her husband, who intermittently corrected her history.  VITAL SIGNS:  She is afebrile with normal vital signs.  HEENT:  Unremarkable.  LUNGS:  Lung fields are completely clear bilaterally to auscultation and  percussion.  CARDIAC:  There is a regular rhythm without murmur, gallop, or rub.  ABDOMEN:  Soft, benign.  EXTREMITIES:  Warm and without calf tenderness, cyanosis, clubbing or edema.   PFTs were reviewed with the patient and her husband, indicating that her  vital capacity actually is normal.  There is no disproportion reduction in  ERV, however.  Her diffusion capacity is also normal.  Note, however, that  these studies were done in the upright position.   IMPRESSION:  Extended discussion with this patient and her husband  lasting  15-20 minutes of a 25-minute visit on the following topics:   1. Paroxysms of nocturnal dyspnea with normal BNP documented during her      last attack in July 2007 would strongly point away from congestive      heart failure, both diastolic and systolic, and toward other      possibilities including nocturnal reflux or asthma.  She has not      responded in a convincing fashion to Zegerid, although I am not      convinced she actually takes it consistently.  I have emphasized taking      the Zegerid consistently, complying with the diet, and trying albuterol      one to two puffs every 4 hours p.r.n., to give [place] feedback as to what      extent it improves the problem.  2. She does have evidence of asbestos pleural plaques by chest x-ray and      does not have any baseline film or CT scan available.  I have therefore  recommended a CT scan to quantitate the problem.  3. She suffers from significant obesity with deconditioning, and I have      reviewed with her again in detail the way to rehabilitate herself,      but she then reminded me, well, I couldn't walk any more than I do now      because my hips give out first, which will remain a major challenge,      especially since many of her nocturnal symptoms may simply be due to      the      effects of obesity, combined with the restrictive physiology imposed by      pleural disease from asbestos.                                   Charlaine Dalton. Sherene Sires, MD, [facility]   MBW/MedQ  DD:  01/13/2006  DT:  01/14/2006  Job #:  [id number]

## 2011-02-13 LAB — COMPREHENSIVE METABOLIC PANEL
ALT: 23
AST: 23
BUN: 6
Calcium: 9.5
Creatinine, Ser: 0.78
GFR calc Af Amer: >60
Potassium: 4.2
Sodium: 143
Total Bilirubin: 0.6
Total Protein: 6.2

## 2011-02-13 LAB — CBC
Hemoglobin: 14.5
MCHC: 35
Platelets: 224
RDW: 13.6

## 2011-02-13 LAB — URINALYSIS, ROUTINE W REFLEX MICROSCOPIC: Glucose, UA: NEGATIVE

## 2011-02-13 LAB — URINE MICROSCOPIC-ADD ON

## 2011-02-13 LAB — PROTIME-INR: Prothrombin Time: 12.1

## 2011-03-31 ENCOUNTER — Other Ambulatory Visit (HOSPITAL_COMMUNITY): Payer: Self-pay | Admitting: Internal Medicine

## 2011-03-31 DIAGNOSIS — Z1231 Encounter for screening mammogram for malignant neoplasm of breast: Secondary | ICD-10-CM

## 2011-05-08 ENCOUNTER — Ambulatory Visit (HOSPITAL_COMMUNITY)
Admission: RE | Admit: 2011-05-08 | Discharge: 2011-05-08 | Disposition: A | Discharge status: Home / Self Care | Payer: Medicare Other | Source: Ambulatory Visit | Attending: Internal Medicine | Admitting: Internal Medicine

## 2011-05-08 DIAGNOSIS — Z1231 Encounter for screening mammogram for malignant neoplasm of breast: Secondary | ICD-10-CM | POA: Insufficient documentation

## 2012-04-06 ENCOUNTER — Other Ambulatory Visit (HOSPITAL_COMMUNITY): Payer: Self-pay | Admitting: Internal Medicine

## 2012-04-06 DIAGNOSIS — Z1231 Encounter for screening mammogram for malignant neoplasm of breast: Secondary | ICD-10-CM

## 2012-05-09 ENCOUNTER — Ambulatory Visit (HOSPITAL_COMMUNITY)
Admission: RE | Admit: 2012-05-09 | Discharge: 2012-05-09 | Disposition: A | Discharge status: Home / Self Care | Payer: Medicare Other | Source: Ambulatory Visit | Attending: Internal Medicine | Admitting: Internal Medicine

## 2012-05-09 DIAGNOSIS — Z1231 Encounter for screening mammogram for malignant neoplasm of breast: Secondary | ICD-10-CM | POA: Insufficient documentation

## 2013-05-07 ENCOUNTER — Emergency Department (HOSPITAL_BASED_OUTPATIENT_CLINIC_OR_DEPARTMENT_OTHER): Payer: Medicare Other

## 2013-05-07 ENCOUNTER — Emergency Department (HOSPITAL_BASED_OUTPATIENT_CLINIC_OR_DEPARTMENT_OTHER)
Admission: EM | Admit: 2013-05-07 | Discharge: 2013-05-07 | Disposition: A | Discharge status: Home / Self Care | Payer: Medicare Other | Attending: Emergency Medicine | Admitting: Emergency Medicine

## 2013-05-07 ENCOUNTER — Encounter (HOSPITAL_BASED_OUTPATIENT_CLINIC_OR_DEPARTMENT_OTHER): Payer: Self-pay | Admitting: Emergency Medicine

## 2013-05-07 DIAGNOSIS — Z7982 Long term (current) use of aspirin: Secondary | ICD-10-CM | POA: Insufficient documentation

## 2013-05-07 DIAGNOSIS — J40 Bronchitis, not specified as acute or chronic: Secondary | ICD-10-CM | POA: Insufficient documentation

## 2013-05-07 DIAGNOSIS — Z79899 Other long term (current) drug therapy: Secondary | ICD-10-CM | POA: Insufficient documentation

## 2013-05-07 DIAGNOSIS — I10 Essential (primary) hypertension: Secondary | ICD-10-CM | POA: Insufficient documentation

## 2013-05-07 DIAGNOSIS — E119 Type 2 diabetes mellitus without complications: Secondary | ICD-10-CM | POA: Insufficient documentation

## 2013-05-07 HISTORY — DX: Essential (primary) hypertension: I10

## 2013-05-07 HISTORY — DX: Type 2 diabetes mellitus without complications: E11.9

## 2013-05-07 IMAGING — CR DG CHEST 2V
2 series · 2 of 2 positions shown · non-contrast
Comparison: [DATE]

CLINICAL DATA: Cough for 1 day, history hypertension, diabetes

EXAM:
CHEST  2 VIEW

[w chest pa]
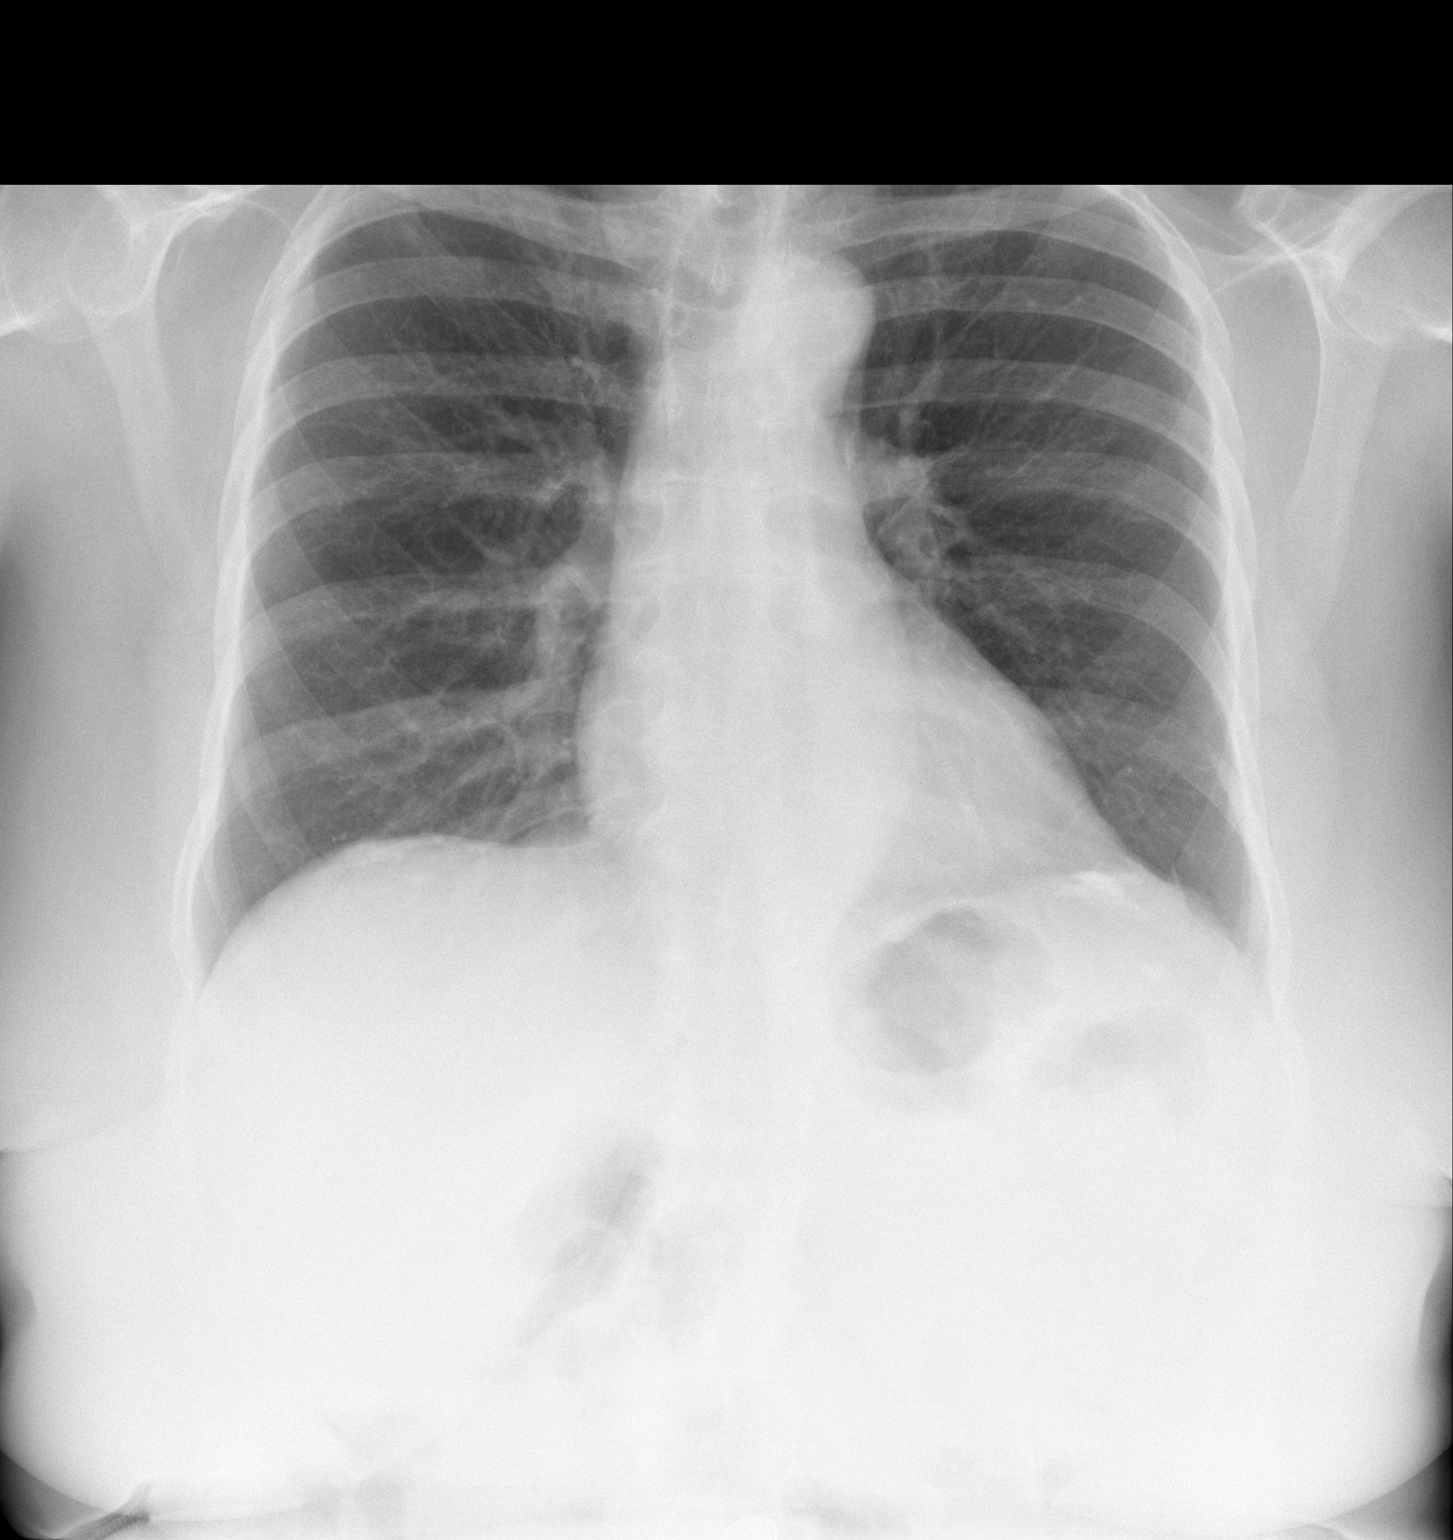

[w chest lat]
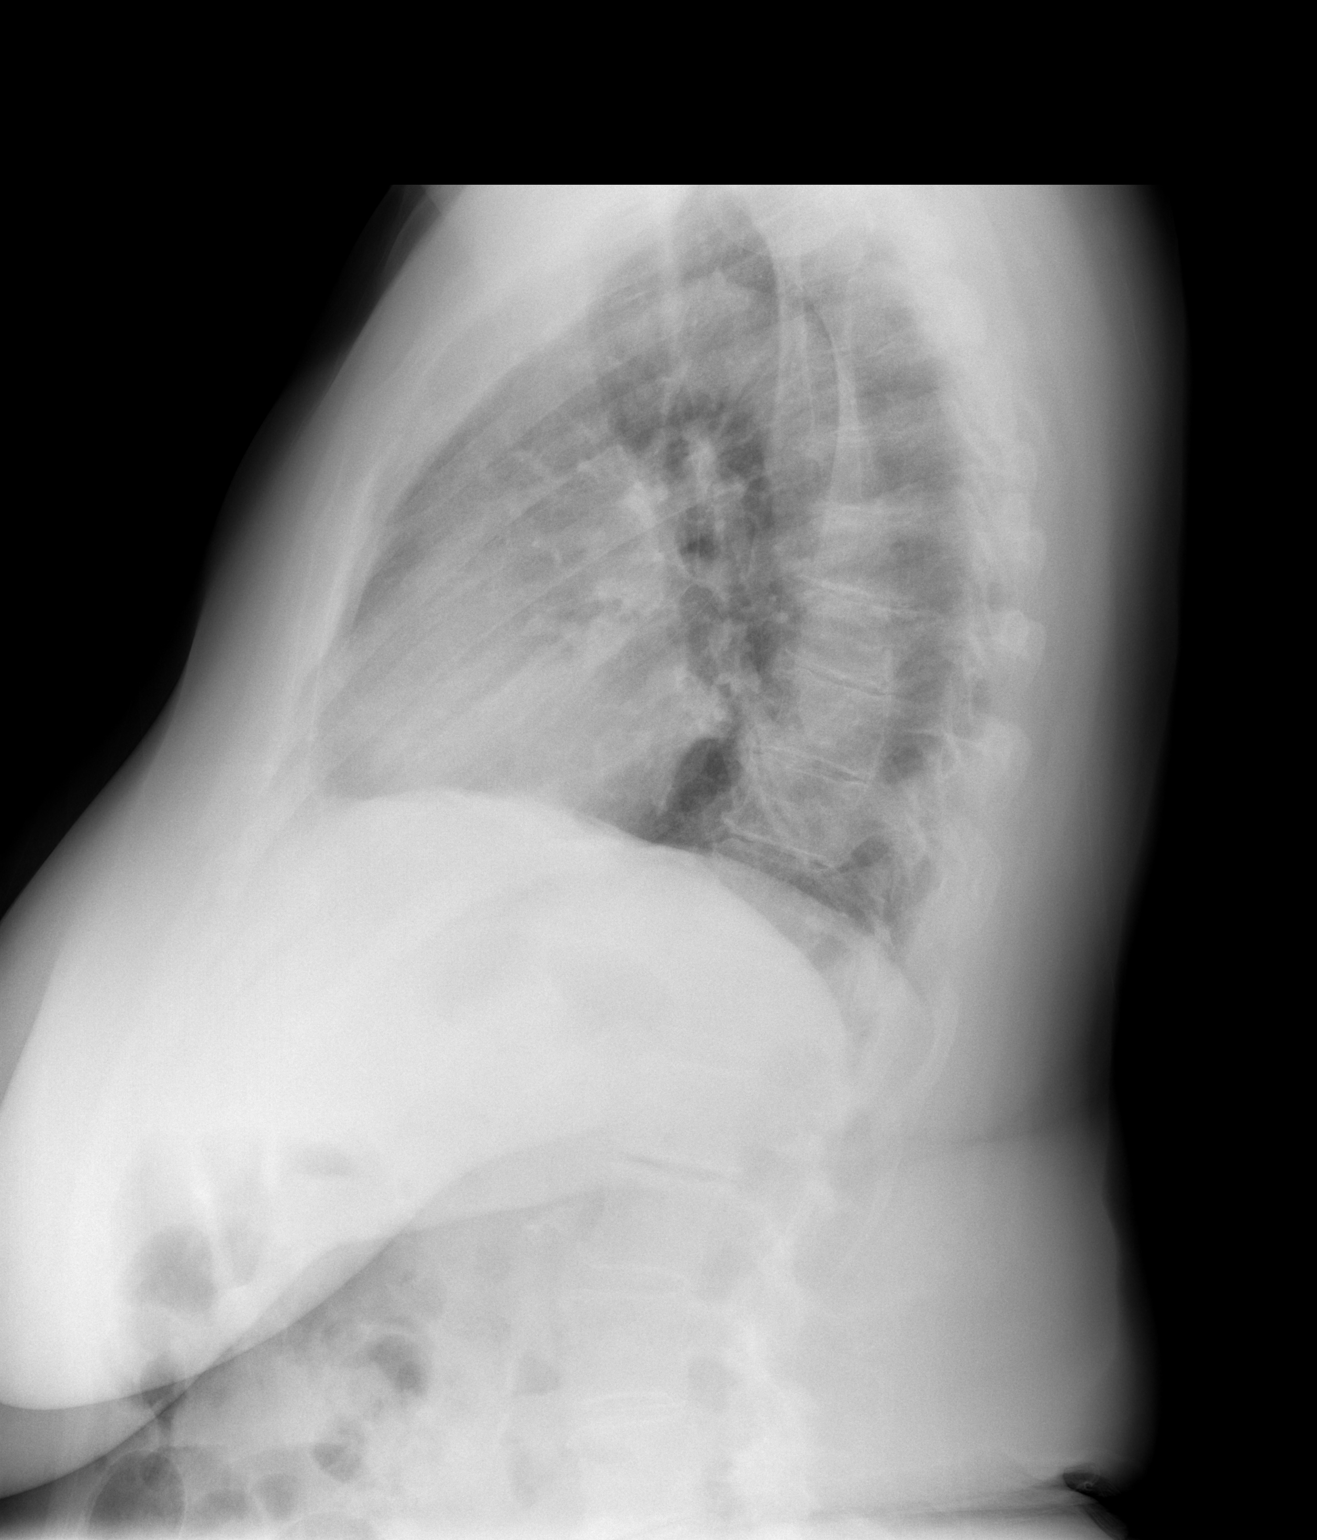

[2 of 2 positions shown; findings below may reference images not displayed]

FINDINGS: Upper normal heart size.

Tortuous aorta.

Mediastinal contours and pulmonary vascularity normal.

Calcified plaques at the diaphragms bilaterally question prior
asbestos exposure.

Lungs clear.

No pleural effusion or pneumothorax.

Mild scattered endplate spur formation thoracic spine.
IMPRESSION: Question asbestos exposure.

No definite acute infiltrate.

## 2013-05-07 MED ORDER — ALBUTEROL SULFATE (5 MG/ML) 0.5% IN NEBU
5.0000 mg | INHALATION_SOLUTION | Freq: Once | RESPIRATORY_TRACT | Status: AC
  Administered 2013-05-07: 5 mg via RESPIRATORY_TRACT
  Filled 2013-05-07: qty 1

## 2013-05-07 MED ORDER — OXYCODONE-ACETAMINOPHEN 5-325 MG PO TABS
1.0000 | ORAL_TABLET | Freq: Once | ORAL | Status: AC
  Administered 2013-05-07: 1 via ORAL
  Filled 2013-05-07: qty 1

## 2013-05-07 MED ORDER — PREDNISONE 10 MG PO TABS: 20.0000 mg | ORAL_TABLET | Freq: Every day | ORAL | Status: DC

## 2013-05-07 MED ORDER — PREDNISONE 20 MG PO TABS
20.0000 mg | ORAL_TABLET | Freq: Once | ORAL | Status: AC
  Administered 2013-05-07: 20 mg via ORAL
  Filled 2013-05-07: qty 1

## 2013-05-07 NOTE — ED Notes (Signed)
After tx pt seems very upset.  Pt wants to know "what happens if this tx doesn't help?"  Told pt we had to give this tx a few minutes to work.  Pt states "my head hurts and I am hungry." Told pt I could get her something to drink. Pt then states "no its cold in here and that blanket there has bugers on it."  Told patient I could get her another blanket. Pt states "no I just want to go home.  RN notified.

## 2013-05-07 NOTE — ED Notes (Signed)
MD at bedside discussing plan

## 2013-05-07 NOTE — ED Notes (Signed)
Patient here with cough x 1 day. Reports dry cough and worse when trying to sleep, no distress

## 2013-05-07 NOTE — ED Provider Notes (Signed)
CSN: [id number]     Arrival date & time 05/07/13  1116 History   First MD Initiated Contact with Patient 05/07/13 1310     Chief Complaint  Patient presents with  . Cough   (Consider location/radiation/quality/duration/timing/severity/associated sxs/prior Treatment) Patient is a 72 y.o. female presenting with cough. The history is provided by the patient.  Cough Cough characteristics:  Non-productive Severity:  Severe Onset quality:  Gradual Duration:  4 days Timing:  Constant Progression:  Unchanged Chronicity:  New Smoker: no   Context comment:  History of probable childhood asbestos exposure Relieved by:  Nothing Worsened by:  Nothing tried Ineffective treatments:  None tried Associated symptoms: chills, rhinorrhea and sinus congestion   Associated symptoms: no fever, no myalgias and no shortness of breath     Past Medical History  Diagnosis Date  . Hypertension   . Diabetes mellitus without complication    History reviewed. No pertinent past surgical history. No family history on file. History  Substance Use Topics  . Smoking status: Never Smoker   . Smokeless tobacco: Not on file  . Alcohol Use: Not on file   OB History   Grav Para Term Preterm Abortions TAB SAB Ect Mult Living                 Review of Systems  Constitutional: Positive for chills. Negative for fever.  HENT: Positive for rhinorrhea.   Respiratory: Positive for cough. Negative for shortness of breath.   Musculoskeletal: Negative for myalgias.  All other systems reviewed and are negative.    Allergies  Iohexol  Home Medications   Current Outpatient Rx  Name  Route  Sig  Dispense  Refill  . aspirin 81 MG tablet   Oral   Take 81 mg by mouth daily.         . hydrochlorothiazide (HYDRODIURIL) 25 MG tablet   Oral   Take 25 mg by mouth daily.         Marland Kitchen lisinopril (PRINIVIL,ZESTRIL) 20 MG tablet   Oral   Take 20 mg by mouth daily.         . metFORMIN (GLUMETZA) 500 MG (MOD)  24 hr tablet   Oral   Take 500 mg by mouth daily with breakfast.         . pravastatin (PRAVACHOL) 80 MG tablet   Oral   Take 80 mg by mouth daily.          BP 147/72  Pulse 96  Temp(Src) 98.5 F (36.9 C) (Oral)  Resp 18  SpO2 98% Physical Exam  Nursing note and vitals reviewed. Constitutional: She is oriented to person, place, and time. She appears well-developed and well-nourished.  HENT:  Head: Normocephalic and atraumatic.  Right Ear: External ear normal.  Left Ear: External ear normal.  Eyes: Conjunctivae and EOM are normal. Pupils are equal, round, and reactive to light.  Neck: Normal range of motion. Neck supple.  Cardiovascular: Normal rate, regular rhythm, normal heart sounds and intact distal pulses.   coughing  Pulmonary/Chest: Effort normal. She has no wheezes.  Abdominal: Soft. Bowel sounds are normal.  Musculoskeletal: Normal range of motion.  Neurological: She is alert and oriented to person, place, and time. She has normal reflexes.  Skin: Skin is warm and dry.  Psychiatric: She has a normal mood and affect. Her behavior is normal. Judgment and thought content normal.    ED Course  Procedures (including critical care time) Labs Review Labs Reviewed - No data  to display Imaging Review Dg Chest 2 View  05/07/2013   CLINICAL DATA:  Cough for 1 day, history hypertension, diabetes  EXAM: CHEST  2 VIEW  COMPARISON:  05/09/2007  FINDINGS: Upper normal heart size.  Tortuous aorta.  Mediastinal contours and pulmonary vascularity normal.  Calcified plaques at the diaphragms bilaterally question prior asbestos exposure.  Lungs clear.  No pleural effusion or pneumothorax.  Mild scattered endplate spur formation thoracic spine.  IMPRESSION: Question asbestos exposure.  No definite acute infiltrate.   Electronically Signed   By: Ulyses Southward M.D.   On: 05/07/2013 14:28    EKG Interpretation   None       MDM  72 year-old female who presents today with cough and  history of asbestosis.  She does not have any infiltrates on chest x-Heliodoro Domagalski. She is given albuterol HFA and placed on prednisone. I discussed the risks benefits of prednisone and she wishes to proceed with prednisone even though she has a known history of diabetes. She states she follows her blood sugars at home and will follow up with Dr. Selena Batten.    Hilario Quarry, MD 05/07/13 [phone]

## 2013-05-08 ENCOUNTER — Telehealth (HOSPITAL_BASED_OUTPATIENT_CLINIC_OR_DEPARTMENT_OTHER): Payer: Self-pay | Admitting: Emergency Medicine

## 2014-09-18 ENCOUNTER — Other Ambulatory Visit (HOSPITAL_COMMUNITY): Payer: Self-pay | Admitting: Internal Medicine

## 2014-09-18 DIAGNOSIS — Z1231 Encounter for screening mammogram for malignant neoplasm of breast: Secondary | ICD-10-CM

## 2014-10-16 ENCOUNTER — Ambulatory Visit (HOSPITAL_COMMUNITY)
Admission: RE | Admit: 2014-10-16 | Discharge: 2014-10-16 | Disposition: A | Discharge status: Home / Self Care | Payer: Medicare Other | Source: Ambulatory Visit | Attending: Internal Medicine | Admitting: Internal Medicine

## 2014-10-16 DIAGNOSIS — Z1231 Encounter for screening mammogram for malignant neoplasm of breast: Secondary | ICD-10-CM | POA: Diagnosis not present

## 2016-12-06 ENCOUNTER — Emergency Department (HOSPITAL_COMMUNITY)
Admission: EM | Admit: 2016-12-06 | Discharge: 2016-12-06 | Disposition: A | Discharge status: Home / Self Care | Payer: Medicare Other | Attending: Emergency Medicine | Admitting: Emergency Medicine

## 2016-12-06 ENCOUNTER — Encounter (HOSPITAL_COMMUNITY): Payer: Self-pay | Admitting: Emergency Medicine

## 2016-12-06 DIAGNOSIS — B028 Zoster with other complications: Secondary | ICD-10-CM | POA: Diagnosis not present

## 2016-12-06 DIAGNOSIS — Z79899 Other long term (current) drug therapy: Secondary | ICD-10-CM | POA: Diagnosis not present

## 2016-12-06 DIAGNOSIS — Z7984 Long term (current) use of oral hypoglycemic drugs: Secondary | ICD-10-CM | POA: Diagnosis not present

## 2016-12-06 DIAGNOSIS — R21 Rash and other nonspecific skin eruption: Secondary | ICD-10-CM | POA: Diagnosis present

## 2016-12-06 DIAGNOSIS — E119 Type 2 diabetes mellitus without complications: Secondary | ICD-10-CM | POA: Insufficient documentation

## 2016-12-06 MED ORDER — ACYCLOVIR 400 MG PO TABS: 800.0000 mg | ORAL_TABLET | Freq: Every day | ORAL | 0 refills | Status: AC

## 2016-12-06 MED ORDER — TETRACAINE HCL 0.5 % OP SOLN
1.0000 [drp] | Freq: Once | OPHTHALMIC | Status: AC
  Administered 2016-12-06: 1 [drp] via OPHTHALMIC
  Filled 2016-12-06: qty 4

## 2016-12-06 MED ORDER — FLUORESCEIN SODIUM 0.6 MG OP STRP
1.0000 | ORAL_STRIP | Freq: Once | OPHTHALMIC | Status: AC
  Administered 2016-12-06: 1 via OPHTHALMIC
  Filled 2016-12-06: qty 1

## 2016-12-06 MED ORDER — ACYCLOVIR 400 MG PO TABS
800.0000 mg | ORAL_TABLET | Freq: Once | ORAL | Status: AC
  Administered 2016-12-06: 800 mg via ORAL
  Filled 2016-12-06: qty 2

## 2016-12-06 NOTE — ED Triage Notes (Signed)
Pt c/o L eye pain and burning since yesterday, pt reports dust / debris got in eye. Pt states she woke today with a rash to nose / forehead and patient developed pain on scalp and tender to touch. Pt also reports pain at mastoid.

## 2016-12-06 NOTE — ED Provider Notes (Signed)
[facility]-EMERGENCY DEPT Provider Note   CSN: [id number] Arrival date & time: 12/06/16  1853     History   Chief Complaint Chief Complaint  Patient presents with  . Eye Pain  . pain at mastoid  . Rash    HPI Heather [name] is a 76 y.o. female who presents to the emergency department for left eye pain and rash. The patient states that on Thursday she was sweeping when a piece of debride flew in the patients eye causing pain. She was able to flush the object out with eye drops but has had ongoing irritation in the eye since. The patient notes late last night she starting having burning rash on the top of her left forehead that did not extend past midline. She has a history of chickenpox. No history of shingles. She also notes a swollen lymphnode behind her mastoid. The patient awoke this morning with increased redness and irritation of the left eye. She has been applying a ice pack for pain. She additionally notes a mild, generalized, 3/10 headache that began while in the emergency department. No visual changes, fever, nausea, emesis, floaters, trauma.   HPI  Past Medical History:  Diagnosis Date  . Diabetes mellitus without complication (HCC)   . Hypertension     There are no active problems to display for this patient.   History reviewed. No pertinent surgical history.  OB History    No data available       Home Medications    Prior to Admission medications   Medication Sig Start Date End Date Taking? Authorizing Provider  amLODipine (NORVASC) 10 MG tablet Take 10 mg by mouth daily.   Yes [provider]  fenofibrate 160 MG tablet Take 160 mg by mouth daily.   Yes [provider]  losartan-hydrochlorothiazide (HYZAAR) 100-12.5 MG tablet Take 1 tablet by mouth daily.   Yes [provider]  metFORMIN (GLUMETZA) 500 MG (MOD) 24 hr tablet Take 500 mg by mouth daily with breakfast.   Yes [provider]  acyclovir (ZOVIRAX) 400 MG tablet Take 2  tablets (800 mg total) by mouth 5 (five) times daily. 12/06/16 12/13/16  Mikel Hardgrove, Elmer SowMichael M, PA-C  predniSONE (DELTASONE) 10 MG tablet Take 2 tablets (20 mg total) by mouth daily. Patient not taking: Reported on 12/06/2016 05/07/13   Margarita Grizzleay, Danielle, MD    Family History History reviewed. No pertinent family history.  Social History Social History  Substance Use Topics  . Smoking status: Never Smoker  . Smokeless tobacco: Never Used  . Alcohol use No     Allergies   Iohexol   Review of Systems Review of Systems  All other systems reviewed and are negative.    Physical Exam Updated Vital Signs BP (!) 153/82 (BP Location: Left Arm)   Pulse 94   Temp 98 F (36.7 C) (Oral)   Resp 20   SpO2 100%   Physical Exam  Constitutional: She appears well-developed and well-nourished.  Non-toxic appearing  HENT:  Head: Normocephalic and atraumatic.  Right Ear: Hearing and external ear normal.  Left Ear: Hearing and external ear normal.  Nose: Nose normal. Right sinus exhibits no maxillary sinus tenderness and no frontal sinus tenderness. Left sinus exhibits no maxillary sinus tenderness and no frontal sinus tenderness.  Mouth/Throat: Uvula is midline, oropharynx is clear and moist and mucous membranes are normal.  Eyes: Pupils are equal, round, and reactive to light. Right eye exhibits no discharge. Left eye exhibits no discharge. Left  conjunctiva is injected. Left conjunctiva has no hemorrhage. No scleral icterus. Left eye exhibits normal extraocular motion and no nystagmus.  Tetracaine, fluoroscein and wood's lamp with no evidence of injury / defect to the left eye. Has normal pupillary exam and normal EOM.  No consensual pain. No photophobia. 14-17 mmHg on left eye. 14-16 mmHg pressures on right eye.   Pulmonary/Chest: Effort normal. No respiratory distress.  Lymphadenopathy:    She has cervical adenopathy (left).  Neurological: She is alert. No cranial nerve deficit or sensory deficit.   Speech clear. Follows commands. No facial droop. PERRLA. EOMI. Normal peripheral fields. CN III-XII intact.  Grossly moves all extremities 4 without ataxia. Coordination intact. Able and appropriate strength for age to upper and lower extremities bilaterally including grip strength. Sensation to light touch intact bilaterally for upper and lower. No pronator drift.   Skin: Skin is warm, dry and intact. Rash noted. No pallor.  Early vesicles on left forehead and hairline, following dermatome and not extending past midline.  Psychiatric: She has a normal mood and affect.  Nursing note and vitals reviewed.    ED Treatments / Results  Labs (all labs ordered are listed, but only abnormal results are displayed) Labs Reviewed - No data to display  EKG  EKG Interpretation None       Radiology No results found.  Procedures Procedures (including critical care time)  Medications Ordered in ED Medications  acyclovir (ZOVIRAX) tablet 800 mg (not administered)  fluorescein ophthalmic strip 1 strip (1 strip Left Eye Given 12/06/16 1957)  tetracaine (PONTOCAINE) 0.5 % ophthalmic solution 1 drop (1 drop Left Eye Given 12/06/16 1957)     Initial Impression / Assessment and Plan / ED Course  I have reviewed the triage vital signs and the nursing notes.  Pertinent labs & imaging results that were available during my care of the patient were reviewed by me and considered in my medical decision making (see chart for details).     76 year old with shingles. Possible opthalmic involvement. Will start the patient on viral medication. First dose given in emergency department. Patient states she lives check to check. Will provide goodrx so medication is more affordable. Will have the patient follow up opthalmology in the morning. Exam non-concerning for orbital cellulitis, hyphema, corneal ulcers, corneal abrasions or trauma.  Patient understands to follow up with ophthalmology. I advised the  patient to return to the emergency department with new or worsening symptoms or new concerns. Specific return precautions discussed. The patient verbalized understanding and agreement with plan. All questions answered. No further questions at this time. The patient appears safe for discharge.  Patient case seen and discussed with Dr. Madilyn Hookees who is in agreement with plan.    Final Clinical Impressions(s) / ED Diagnoses   Final diagnoses:  Herpes zoster with complication    New Prescriptions New Prescriptions   ACYCLOVIR (ZOVIRAX) 400 MG TABLET    Take 2 tablets (800 mg total) by mouth 5 (five) times daily.     Princella PellegriniMaczis, Gaynor Genco M, PA-C 12/06/16 2137    Tilden Fossaees, Elizabeth, MD 12/07/16 [phone]

## 2016-12-06 NOTE — Discharge Instructions (Signed)
I have provided you with a coupon for you medication. The first dose was given here. Please pick up your prescription in the morning and take as prescribed. Please see the ophthalmologist tomorrow. If you develop worsening or new concerning symptoms you can return to the emergency department for re-evaluation.

## 2016-12-06 NOTE — ED Notes (Signed)
Glasses are utilized eye vision at norm per baseline

## 2018-02-08 ENCOUNTER — Other Ambulatory Visit: Payer: Self-pay | Admitting: Family Medicine

## 2018-02-08 DIAGNOSIS — E2839 Other primary ovarian failure: Secondary | ICD-10-CM

## 2018-02-08 DIAGNOSIS — Z1239 Encounter for other screening for malignant neoplasm of breast: Secondary | ICD-10-CM

## 2019-05-12 DIAGNOSIS — Z8673 Personal history of transient ischemic attack (TIA), and cerebral infarction without residual deficits: Secondary | ICD-10-CM

## 2019-05-12 HISTORY — DX: Personal history of transient ischemic attack (TIA), and cerebral infarction without residual deficits: Z86.73

## 2019-07-07 ENCOUNTER — Ambulatory Visit: Payer: No Typology Code available for payment source

## 2020-01-16 ENCOUNTER — Emergency Department (HOSPITAL_BASED_OUTPATIENT_CLINIC_OR_DEPARTMENT_OTHER): Payer: Medicare Other

## 2020-01-16 ENCOUNTER — Encounter (HOSPITAL_BASED_OUTPATIENT_CLINIC_OR_DEPARTMENT_OTHER): Payer: Self-pay | Admitting: *Deleted

## 2020-01-16 ENCOUNTER — Emergency Department (HOSPITAL_BASED_OUTPATIENT_CLINIC_OR_DEPARTMENT_OTHER)
Admission: EM | Admit: 2020-01-16 | Discharge: 2020-01-17 | Disposition: A | Discharge status: Home / Self Care | Payer: Medicare Other | Attending: Emergency Medicine | Admitting: Emergency Medicine

## 2020-01-16 ENCOUNTER — Other Ambulatory Visit: Payer: Self-pay

## 2020-01-16 DIAGNOSIS — Z20822 Contact with and (suspected) exposure to covid-19: Secondary | ICD-10-CM | POA: Insufficient documentation

## 2020-01-16 DIAGNOSIS — I10 Essential (primary) hypertension: Secondary | ICD-10-CM | POA: Insufficient documentation

## 2020-01-16 DIAGNOSIS — R410 Disorientation, unspecified: Secondary | ICD-10-CM

## 2020-01-16 DIAGNOSIS — N3000 Acute cystitis without hematuria: Secondary | ICD-10-CM | POA: Insufficient documentation

## 2020-01-16 DIAGNOSIS — Z79899 Other long term (current) drug therapy: Secondary | ICD-10-CM | POA: Insufficient documentation

## 2020-01-16 DIAGNOSIS — Z7984 Long term (current) use of oral hypoglycemic drugs: Secondary | ICD-10-CM | POA: Insufficient documentation

## 2020-01-16 DIAGNOSIS — E119 Type 2 diabetes mellitus without complications: Secondary | ICD-10-CM | POA: Insufficient documentation

## 2020-01-16 LAB — COMPREHENSIVE METABOLIC PANEL
ALT: 16 U/L (ref 0–44)
AST: 24 U/L (ref 15–41)
Albumin: 3.9 g/dL (ref 3.5–5.0)
Alkaline Phosphatase: 95 U/L (ref 38–126)
Anion gap: 11 (ref 5–15)
BUN: 21 mg/dL (ref 8–23)
CO2: 27 mmol/L (ref 22–32)
Calcium: 9 mg/dL (ref 8.9–10.3)
Chloride: 103 mmol/L (ref 98–111)
Creatinine, Ser: 1.29 mg/dL — ABNORMAL HIGH (ref 0.44–1.00)
GFR calc Af Amer: 46 mL/min — ABNORMAL LOW (ref >60)
GFR calc non Af Amer: 40 mL/min — ABNORMAL LOW (ref >60)
Glucose, Bld: 113 mg/dL — ABNORMAL HIGH (ref 70–99)
Potassium: 3.5 mmol/L (ref 3.5–5.1)
Sodium: 141 mmol/L (ref 135–145)
Total Bilirubin: 0.5 mg/dL (ref 0.3–1.2)
Total Protein: 7.1 g/dL (ref 6.5–8.1)

## 2020-01-16 LAB — URINALYSIS, MICROSCOPIC (REFLEX): WBC, UA: >50 WBC/hpf (ref 0–5)

## 2020-01-16 LAB — TROPONIN I (HIGH SENSITIVITY)
Troponin I (High Sensitivity): 9 ng/L (ref <18)
Troponin I (High Sensitivity): 9 ng/L (ref <18)

## 2020-01-16 LAB — CBG MONITORING, ED: Glucose-Capillary: 131 mg/dL — ABNORMAL HIGH (ref 70–99)

## 2020-01-16 LAB — CBC WITH DIFFERENTIAL/PLATELET
Abs Immature Granulocytes: 0.02 10*3/uL (ref 0.00–0.07)
Basophils Absolute: 0 10*3/uL (ref 0.0–0.1)
Basophils Relative: 1 %
Eosinophils Absolute: 0.1 10*3/uL (ref 0.0–0.5)
Eosinophils Relative: 2 %
HCT: 44.6 % (ref 36.0–46.0)
Hemoglobin: 14.9 g/dL (ref 12.0–15.0)
Immature Granulocytes: 0 %
Lymphocytes Relative: 24 %
Lymphs Abs: 1.8 10*3/uL (ref 0.7–4.0)
MCH: 30.5 pg (ref 26.0–34.0)
MCHC: 33.4 g/dL (ref 30.0–36.0)
MCV: 91.4 fL (ref 80.0–100.0)
Monocytes Absolute: 0.6 10*3/uL (ref 0.1–1.0)
Monocytes Relative: 8 %
Neutro Abs: 4.9 10*3/uL (ref 1.7–7.7)
Neutrophils Relative %: 65 %
Platelets: 247 10*3/uL (ref 150–400)
RBC: 4.88 MIL/uL (ref 3.87–5.11)
RDW: 13.5 % (ref 11.5–15.5)
WBC: 7.5 10*3/uL (ref 4.0–10.5)
nRBC: 0 % (ref 0.0–0.2)

## 2020-01-16 LAB — URINALYSIS, ROUTINE W REFLEX MICROSCOPIC
Bilirubin Urine: NEGATIVE
Glucose, UA: NEGATIVE mg/dL
Ketones, ur: NEGATIVE mg/dL
Nitrite: NEGATIVE
Protein, ur: 100 mg/dL — AB
Specific Gravity, Urine: 1.025 (ref 1.005–1.030)
pH: 6 (ref 5.0–8.0)

## 2020-01-16 LAB — SARS CORONAVIRUS 2 BY RT PCR (HOSPITAL ORDER, PERFORMED IN ~~LOC~~ HOSPITAL LAB): SARS Coronavirus 2: NEGATIVE

## 2020-01-16 IMAGING — CT CT HEAD W/O CM
3 series · 16 of 47 positions shown, 19 images · non-contrast
Comparison: None.

CLINICAL DATA: Mental status changes

EXAM:
CT HEAD WITHOUT CONTRAST
TECHNIQUE: Contiguous axial images were obtained from the base of the skull
through the vertex without intravenous contrast.

[Series 3: head wo · axial · 0.40mm/px · z∈[+1102,+1228]mm · 10 of 30 slices shown, 13 images]
[im 3/30  brain]
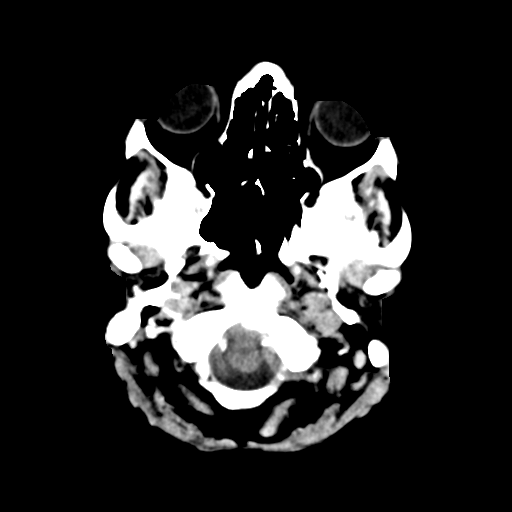
[im 3/30  bone]
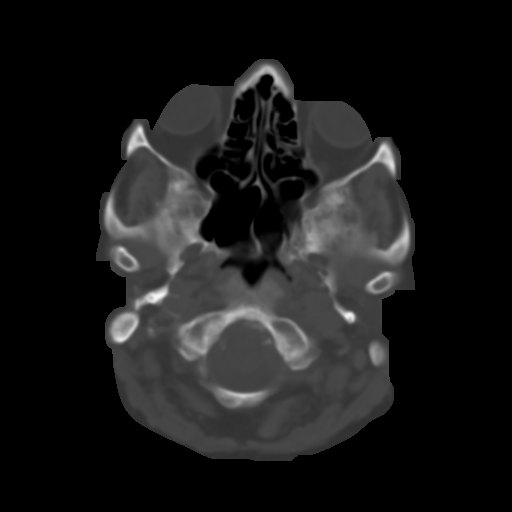
[im 6/30  brain]
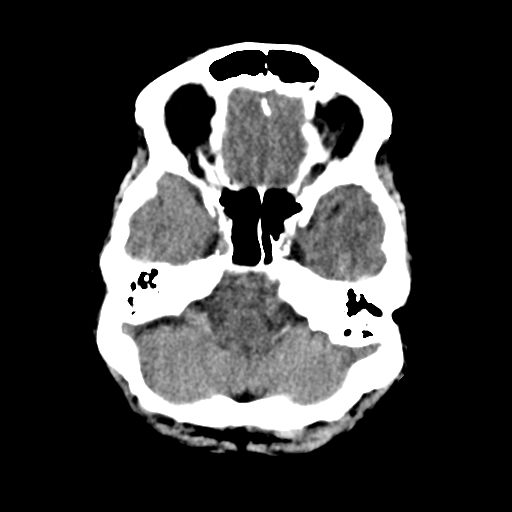
[im 9/30  brain]
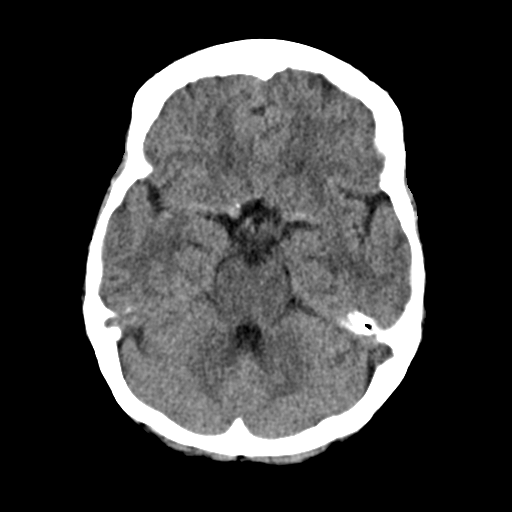
[im 11/30  brain]
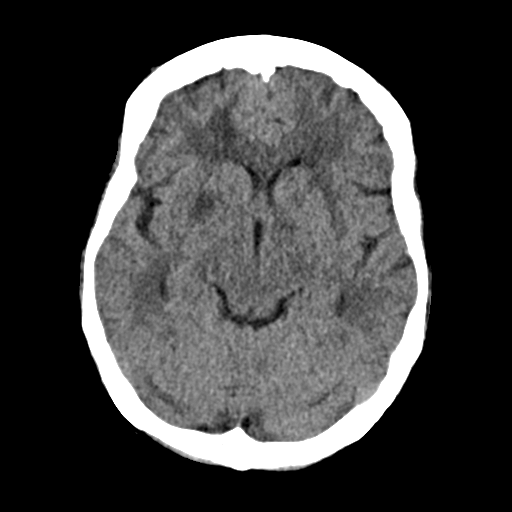
[im 14/30  brain]
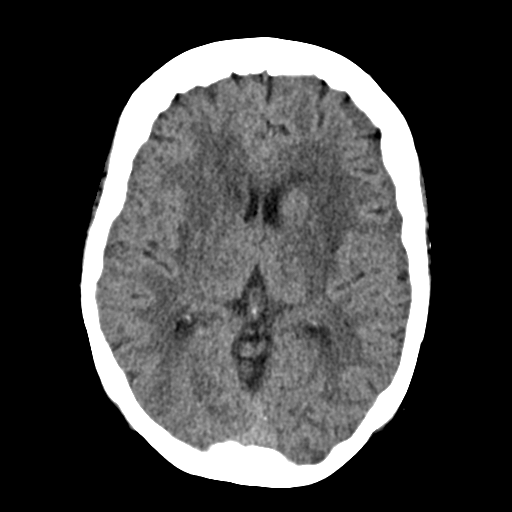
[im 14/30  bone]
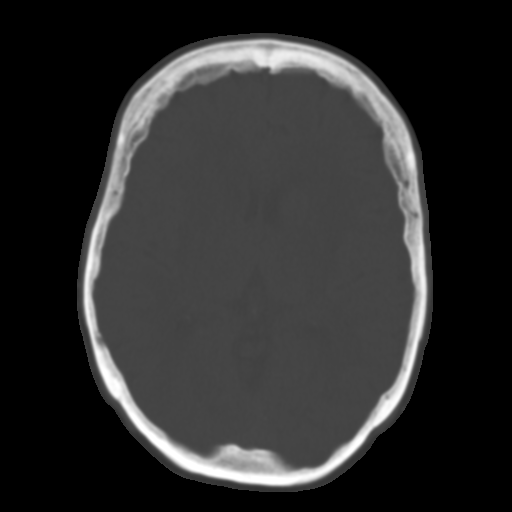
[im 17/30  brain]
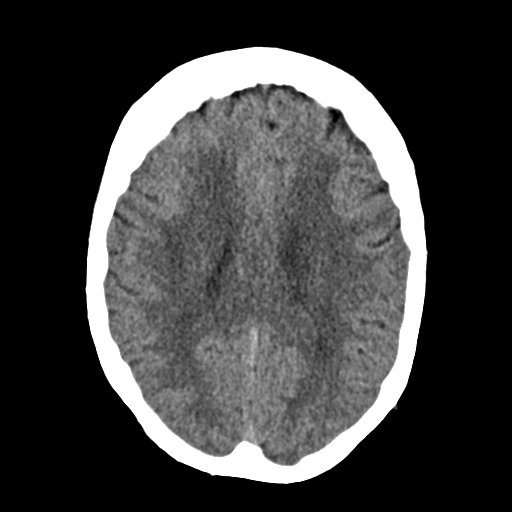
[im 20/30  brain]
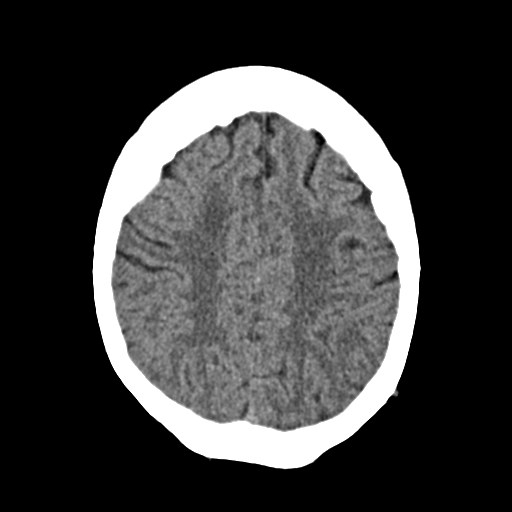
[im 23/30  brain]
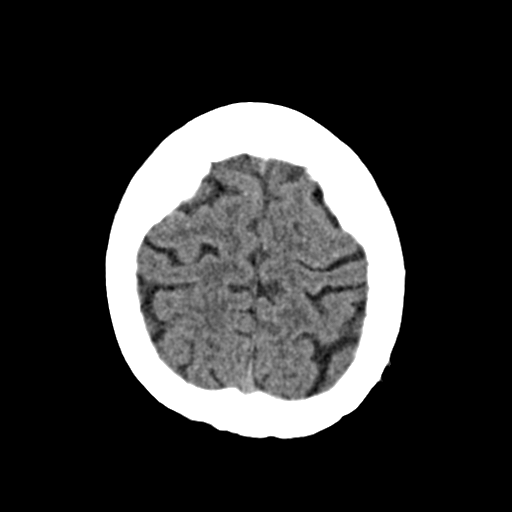
[im 25/30  brain]
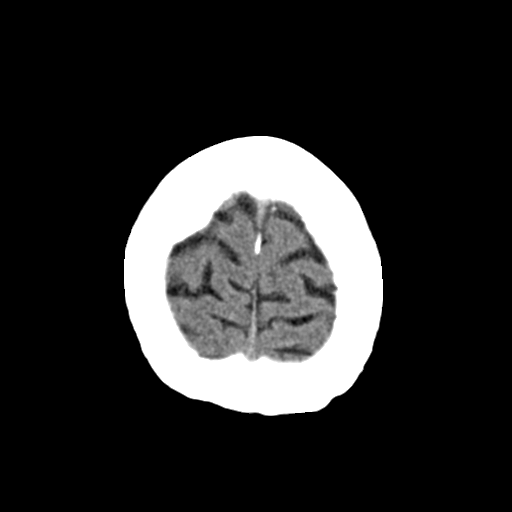
[im 25/30  bone]
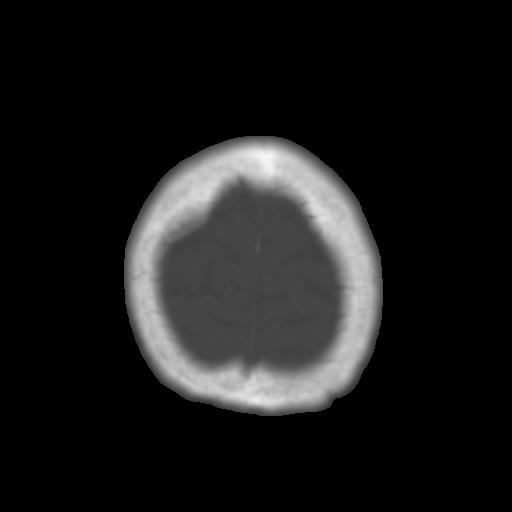
[im 28/30  brain]
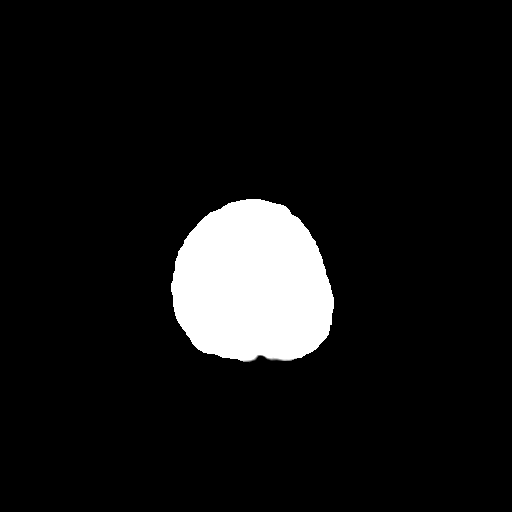

[Series 4: coronal soft · coronal · 0.29mm/px · 3 of 62 slices shown]
[im 21/62  brain]
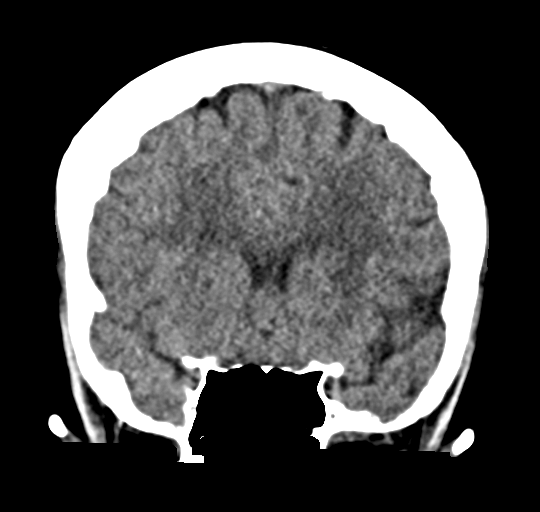
[im 28/62  brain]
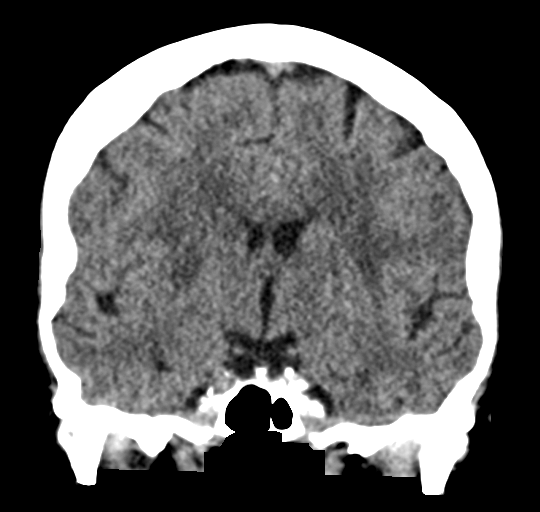
[im 34/62  brain]
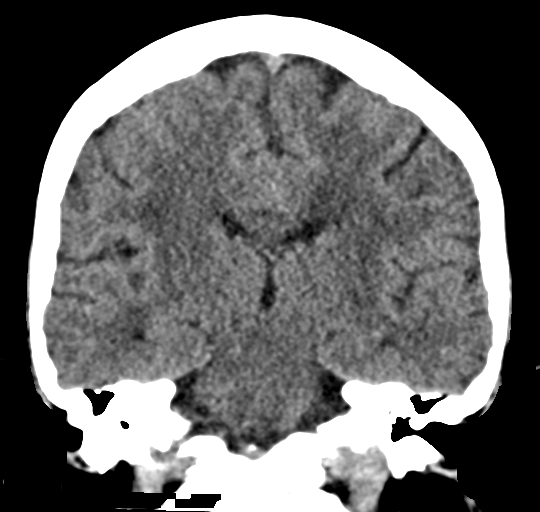

[Series 5: sag soft · sagittal · 0.34mm/px · 3 of 46 slices shown]
[im 16/46  brain]
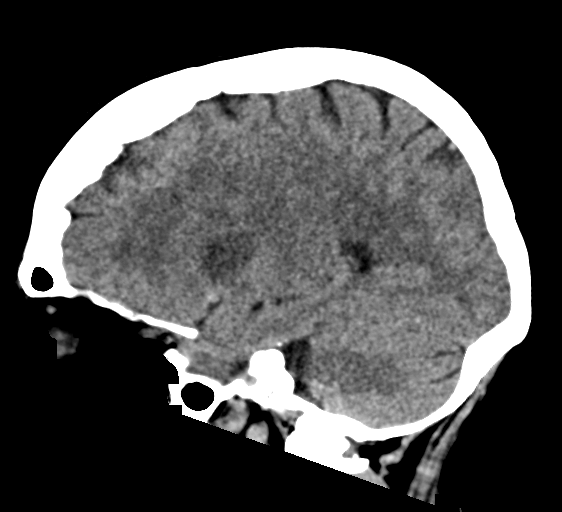
[im 23/46  brain]
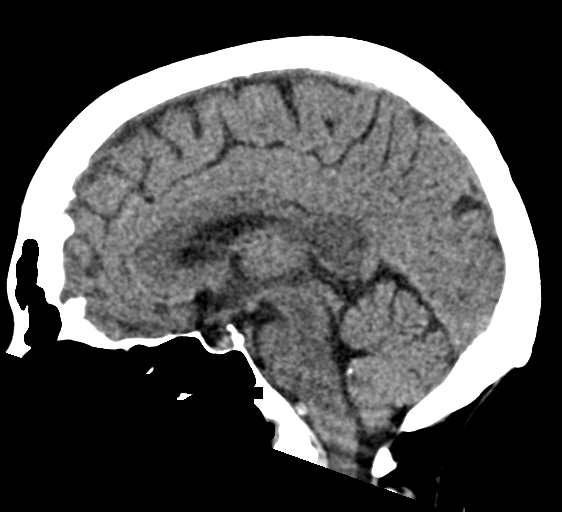
[im 31/46  brain]
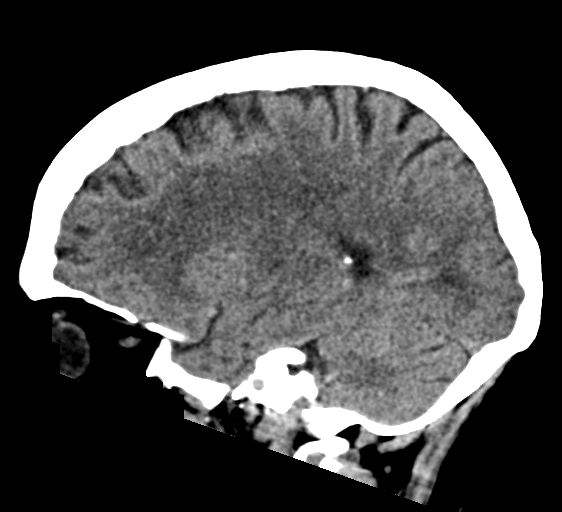

[16 of 47 positions shown; findings below may reference images not displayed]

FINDINGS: Brain: Low-density seen within the right basal ganglia compatible
with lacunar infarct. This could be acute or subacute. Chronic small
vessel disease changes throughout the deep white matter. No
hemorrhage or hydrocephalus.

Vascular: No hyperdense vessel or unexpected calcification.

Skull: No acute calvarial abnormality.

Sinuses/Orbits: Visualized paranasal sinuses and mastoids clear.
Orbital soft tissues unremarkable.

Other: None
IMPRESSION: Low-density in the right basal ganglia compatible with age
indeterminate lacunar infarct. Cannot exclude acute or subacute
infarct.

Chronic small vessel disease throughout the deep white matter.

## 2020-01-16 IMAGING — DX DG CHEST 1V PORT
1 series · 1 of 1 positions shown · non-contrast
Comparison: [DATE]

CLINICAL DATA: Altered level of consciousness

EXAM:
PORTABLE CHEST 1 VIEW

[chest ap]
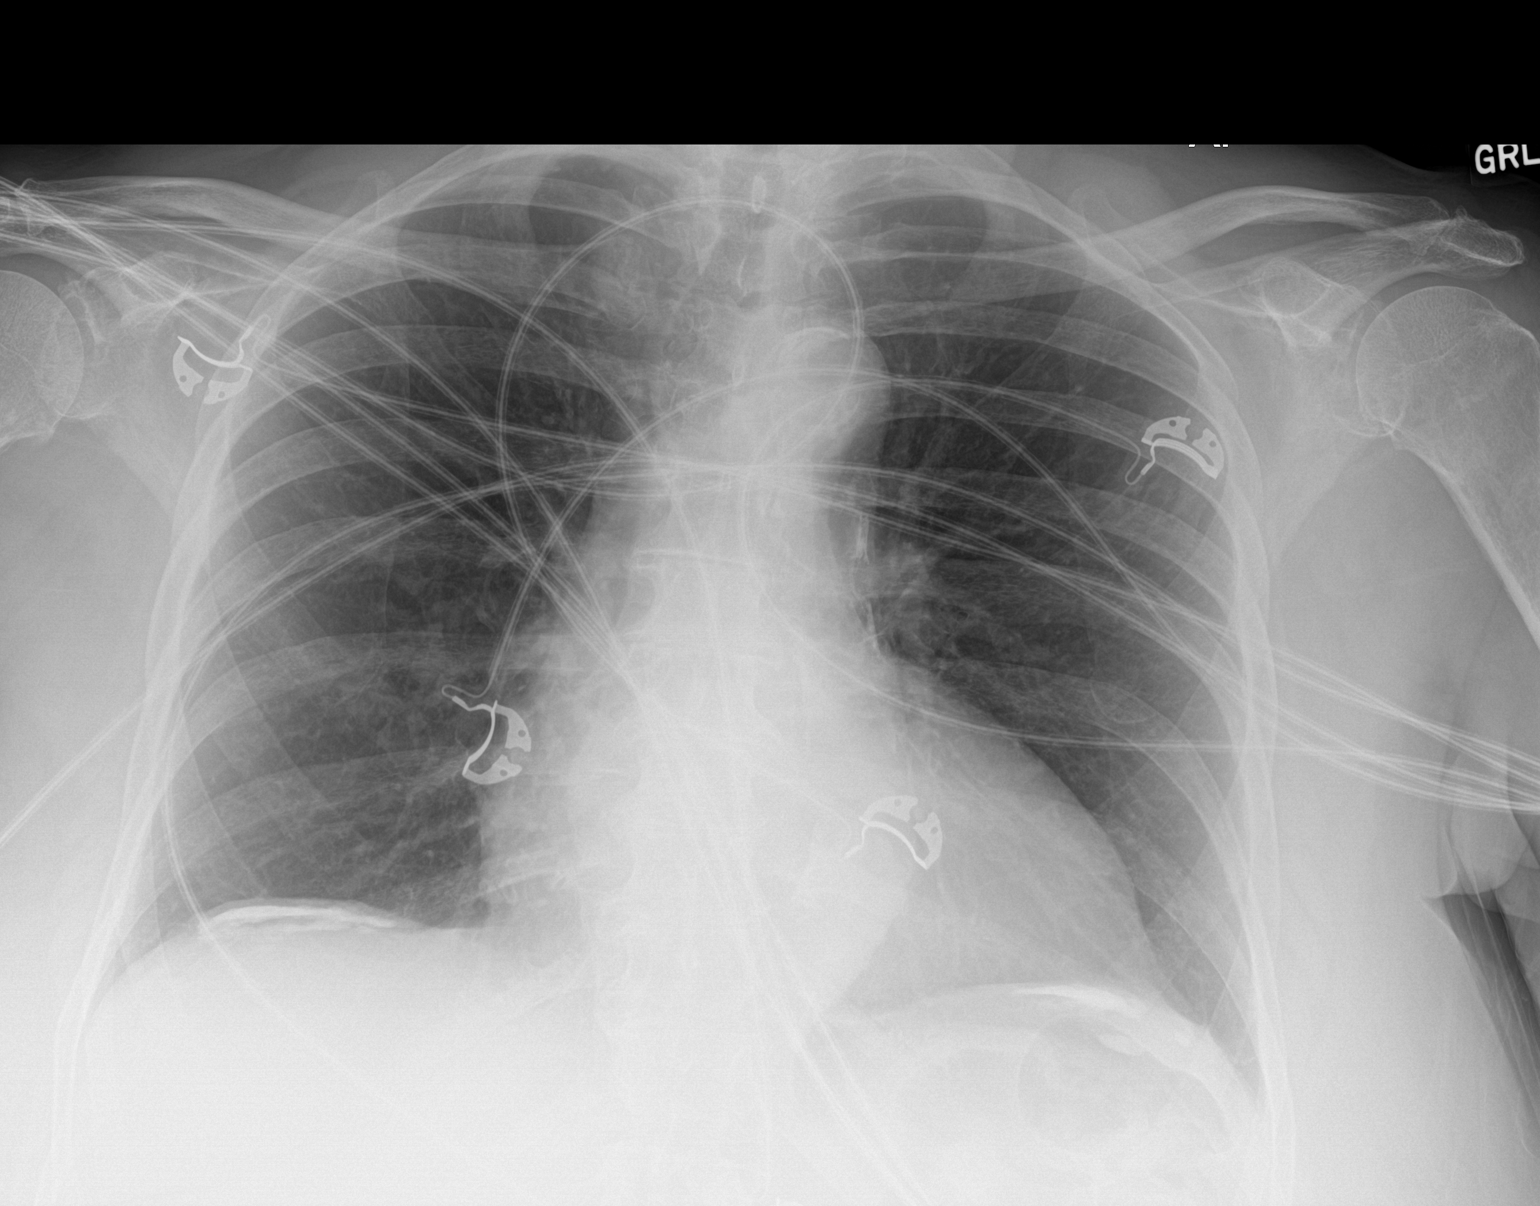

[1 of 1 positions shown; findings below may reference images not displayed]

FINDINGS: Single frontal view of the chest demonstrates a stable cardiac
silhouette. Ectasias of the thoracic aorta, with mild diffuse
atherosclerosis. Continued calcified pleural plaques. No airspace
disease, effusion, or pneumothorax.
IMPRESSION: 1. No acute intrathoracic process.

## 2020-01-16 MED ORDER — CEPHALEXIN 500 MG PO CAPS: 500.0000 mg | ORAL_CAPSULE | Freq: Four times a day (QID) | ORAL | 0 refills | Status: DC

## 2020-01-16 MED ORDER — CEFTRIAXONE SODIUM 1 G IJ SOLR
1.0000 g | Freq: Once | INTRAMUSCULAR | Status: AC
  Administered 2020-01-16: 1 g via INTRAVENOUS
  Filled 2020-01-16: qty 10

## 2020-01-16 MED ORDER — SODIUM CHLORIDE 0.9 % IV SOLN
INTRAVENOUS | Status: DC
Start: 2020-01-16 — End: 2020-01-17

## 2020-01-16 NOTE — ED Notes (Signed)
Placed pt. On monitor with attempt to do EKG.  EKG not attainable unknown why not working.

## 2020-01-16 NOTE — ED Provider Notes (Signed)
[facility] EMERGENCY DEPARTMENT Provider Note   CSN: [id number] Arrival date & time: 01/16/20  1346     History Chief Complaint  Patient presents with  . Confused, Sluggish    Heather [name] is a 79 y.o. female.  Patient brought in by her daughter.  Concern is for confusion.  Daughter saw her last Friday and she was not confused.  Patient without slurred speech.  No headache.  May have been a little off on her gait.  But no obvious weakness.  Patient speech was fine just seemed to be confused.  Words were correct.  But just did not make sense in the phrasing.  Past medical history sniffing for hypertension and diabetes but patient not on any antihypertensive meds currently.  Does not have a primary care provider.  Blood pressure on presentation here was 158/95.  When I saw patient it was 177/90.  No fever.  Heart rate 64 oxygen saturations on room air 98%.  No fevers no nausea vomiting or diarrhea.  No fall or injury.        Past Medical History:  Diagnosis Date  . Diabetes mellitus without complication (HCC)   . Hypertension     There are no problems to display for this patient.   History reviewed. No pertinent surgical history.   OB History    Gravida  3   Para  3   Term      Preterm      AB      Living        SAB      TAB      Ectopic      Multiple      Live Births              History reviewed. No pertinent family history.  Social History   Tobacco Use  . Smoking status: Never Smoker  . Smokeless tobacco: Never Used  Vaping Use  . Vaping Use: Never used  Substance Use Topics  . Alcohol use: No  . Drug use: No    Home Medications Prior to Admission medications   Medication Sig Start Date End Date Taking? Authorizing Provider  amLODipine (NORVASC) 10 MG tablet Take 10 mg by mouth daily.    [provider]  cephALEXin (KEFLEX) 500 MG capsule Take 1 capsule (500 mg total) by mouth 4 (four) times daily. 01/16/20    Vanetta Mulders, MD  fenofibrate 160 MG tablet Take 160 mg by mouth daily.    [provider]  losartan-hydrochlorothiazide (HYZAAR) 100-12.5 MG tablet Take 1 tablet by mouth daily.    [provider]  metFORMIN (GLUMETZA) 500 MG (MOD) 24 hr tablet Take 500 mg by mouth daily with breakfast.    [provider]  predniSONE (DELTASONE) 10 MG tablet Take 2 tablets (20 mg total) by mouth daily. Patient not taking: Reported on 12/06/2016 05/07/13   Margarita Grizzle, MD    Allergies    Iohexol  Review of Systems   Review of Systems  Constitutional: Negative for chills and fever.  HENT: Negative for congestion, rhinorrhea and sore throat.   Eyes: Negative for visual disturbance.  Respiratory: Negative for cough and shortness of breath.   Cardiovascular: Negative for chest pain and leg swelling.  Gastrointestinal: Negative for abdominal pain, diarrhea, nausea and vomiting.  Genitourinary: Negative for dysuria.  Musculoskeletal: Negative for back pain and neck pain.  Skin: Negative for rash.  Neurological: Negative for dizziness, facial asymmetry, speech  difficulty, weakness, light-headedness and headaches.  Hematological: Does not bruise/bleed easily.  Psychiatric/Behavioral: Positive for confusion.    Physical Exam Updated Vital Signs BP (!) 174/82   Pulse 68   Temp 98.1 F (36.7 C) (Oral)   Resp 16   Ht 1.524 m (5')   SpO2 97%   Physical Exam Vitals and nursing note reviewed.  Constitutional:      General: She is not in acute distress.    Appearance: Normal appearance. She is well-developed.  HENT:     Head: Normocephalic and atraumatic.  Eyes:     Conjunctiva/sclera: Conjunctivae normal.     Pupils: Pupils are equal, round, and reactive to light.  Cardiovascular:     Rate and Rhythm: Normal rate and regular rhythm.     Heart sounds: No murmur heard.   Pulmonary:     Effort: Pulmonary effort is normal. No respiratory distress.     Breath sounds:  Normal breath sounds.  Abdominal:     Palpations: Abdomen is soft.     Tenderness: There is no abdominal tenderness.  Musculoskeletal:        General: No swelling. Normal range of motion.     Cervical back: Normal range of motion and neck supple.  Skin:    General: Skin is warm and dry.  Neurological:     General: No focal deficit present.     Mental Status: She is alert and oriented to person, place, and time.     Cranial Nerves: No cranial nerve deficit.     Sensory: No sensory deficit.     Motor: No weakness.     Coordination: Coordination normal.     ED Results / Procedures / Treatments   Labs (all labs ordered are listed, but only abnormal results are displayed) Labs Reviewed  URINALYSIS, ROUTINE W REFLEX MICROSCOPIC - Abnormal; Notable for the following components:      Result Value   APPearance CLOUDY (*)    Hgb urine dipstick SMALL (*)    Protein, ur 100 (*)    Leukocytes,Ua LARGE (*)    All other components within normal limits  URINALYSIS, MICROSCOPIC (REFLEX) - Abnormal; Notable for the following components:   Bacteria, UA MANY (*)    All other components within normal limits  COMPREHENSIVE METABOLIC PANEL - Abnormal; Notable for the following components:   Glucose, Bld 113 (*)    Creatinine, Ser 1.29 (*)    GFR calc non Af Amer 40 (*)    GFR calc Af Amer 46 (*)    All other components within normal limits  CBG MONITORING, ED - Abnormal; Notable for the following components:   Glucose-Capillary 131 (*)    All other components within normal limits  SARS CORONAVIRUS 2 BY RT PCR (HOSPITAL ORDER, PERFORMED IN [facility])  URINE CULTURE  CBC WITH DIFFERENTIAL/PLATELET  TROPONIN I (HIGH SENSITIVITY)  TROPONIN I (HIGH SENSITIVITY)    EKG EKG Interpretation  Date/Time:  Tuesday January 16 2020 19:45:01 EDT Ventricular Rate:  60 PR Interval:    QRS Duration: 152 QT Interval:  472 QTC Calculation: 472 R Axis:   21 Text Interpretation: Sinus  rhythm Prolonged PR interval Right bundle branch block LVH by voltage Inferior infarct, age indeterminate Confirmed by Vanetta Mulders [phone]) on 01/16/2020 8:02:46 PM   Radiology CT Head Wo Contrast  Result Date: 01/16/2020 CLINICAL DATA:  Mental status changes EXAM: CT HEAD WITHOUT CONTRAST TECHNIQUE: Contiguous axial images were obtained from the base of the  skull through the vertex without intravenous contrast. COMPARISON:  None. FINDINGS: Brain: Low-density seen within the right basal ganglia compatible with lacunar infarct. This could be acute or subacute. Chronic small vessel disease changes throughout the deep white matter. No hemorrhage or hydrocephalus. Vascular: No hyperdense vessel or unexpected calcification. Skull: No acute calvarial abnormality. Sinuses/Orbits: Visualized paranasal sinuses and mastoids clear. Orbital soft tissues unremarkable. Other: None IMPRESSION: Low-density in the right basal ganglia compatible with age indeterminate lacunar infarct. Cannot exclude acute or subacute infarct. 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 Electronically Signed   By: Charlett Nose M.D.   On: 01/16/2020 20:15   DG Chest Port 1 View  Result Date: 01/16/2020 CLINICAL DATA:  Altered level of consciousness EXAM: PORTABLE CHEST 1 VIEW COMPARISON:  05/07/2013 FINDINGS: Single frontal view of the chest demonstrates a stable cardiac silhouette. Ectasias of the thoracic aorta, with mild diffuse atherosclerosis. Continued calcified pleural plaques. No airspace disease, effusion, or pneumothorax. IMPRESSION: 1. No acute intrathoracic process. Electronically Signed   By: Sharlet Salina M.D.   On: 01/16/2020 20:25    Procedures Procedures (including critical care time)  Medications Ordered in ED Medications  0.9 %  sodium chloride infusion ( Intravenous New Bag/Given 01/16/20 2128)  cefTRIAXone (ROCEPHIN) 1 g in sodium chloride 0.9 % 100 mL IVPB (0 g Intravenous Stopped 01/16/20 2204)      ED Course  I have reviewed the triage vital signs and the nursing notes.  Pertinent labs & imaging results that were available during my care of the patient were reviewed by me and considered in my medical decision making (see chart for details).    MDM Rules/Calculators/A&P                          Work-up shows evidence of urinary tract infection.  Most likely explaining some of the confusion.  Patient is neuro exam without any focal deficit at all.  Patient speaking fine here.  Daughter feels that she is doing better.  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  Urine sent for culture.  No leukocytosis no significant electrolyte abnormalities other than a little bit elevation in the creatinine.  Patient's blood pressure is elevated here not currently on medications but appeared may be in years past she was.  Patient given 1 g of Rocephin here.  Patient will be continued on Keflex.  Given wellness referral since she does not have a primary care doctor.  We will have him follow-up with the kidney function as well as blood pressure.  Patient stable for discharge home and follow-up.  Daughter will bring her back for any new or worse symptoms or if there is no improvement in 2 days.  Or take her into [facility] for consideration of possible MRI.     Final Clinical Impression(s) / ED Diagnoses Final diagnoses:  Acute cystitis without hematuria  Essential hypertension  Confusion    Rx / DC Orders ED Discharge Orders         Ordered    cephALEXin (KEFLEX) 500 MG capsule  4 times daily        01/16/20 2345           Vanetta Mulders, MD 01/16/20 2354

## 2020-01-16 NOTE — ED Triage Notes (Signed)
Daughter is concerned about patient's confusion.  Daughter saw her last Friday and is was not confused.

## 2020-01-16 NOTE — Discharge Instructions (Signed)
Take the antibiotic Keflex as directed.  Urine was sent for culture.  You be notified if the antibiotic is incorrect.  Kidney function a little off today will need some follow-up for that.  Make an appointment at the wellness clinic.  Also blood pressure little elevated here today.  Follow-up on that would be important as well.  Return for any new or worse symptoms or if not improving in 2 days.  Next step would maybe be consideration for MRI brain based on the head CT today.  But do not feel that there is any distinct evidence of stroke here today.  Covid testing was negative.

## 2020-01-17 ENCOUNTER — Inpatient Hospital Stay (HOSPITAL_COMMUNITY): Payer: Medicare Other

## 2020-01-17 ENCOUNTER — Inpatient Hospital Stay (HOSPITAL_COMMUNITY)
Admission: EM | Admit: 2020-01-17 | Discharge: 2020-01-22 | DRG: 064 | Disposition: A | Discharge status: Rehabilitation Facility | Payer: Medicare Other | Attending: Internal Medicine | Admitting: Internal Medicine

## 2020-01-17 ENCOUNTER — Encounter (HOSPITAL_COMMUNITY): Payer: Self-pay

## 2020-01-17 ENCOUNTER — Emergency Department (HOSPITAL_COMMUNITY): Payer: Medicare Other

## 2020-01-17 ENCOUNTER — Other Ambulatory Visit: Payer: Self-pay

## 2020-01-17 DIAGNOSIS — E785 Hyperlipidemia, unspecified: Secondary | ICD-10-CM | POA: Diagnosis present

## 2020-01-17 DIAGNOSIS — I10 Essential (primary) hypertension: Secondary | ICD-10-CM | POA: Diagnosis present

## 2020-01-17 DIAGNOSIS — G92 Toxic encephalopathy: Secondary | ICD-10-CM | POA: Diagnosis not present

## 2020-01-17 DIAGNOSIS — E119 Type 2 diabetes mellitus without complications: Secondary | ICD-10-CM | POA: Diagnosis present

## 2020-01-17 DIAGNOSIS — I160 Hypertensive urgency: Secondary | ICD-10-CM | POA: Diagnosis present

## 2020-01-17 DIAGNOSIS — N390 Urinary tract infection, site not specified: Secondary | ICD-10-CM | POA: Diagnosis present

## 2020-01-17 DIAGNOSIS — E876 Hypokalemia: Secondary | ICD-10-CM | POA: Diagnosis present

## 2020-01-17 DIAGNOSIS — R4182 Altered mental status, unspecified: Secondary | ICD-10-CM | POA: Diagnosis not present

## 2020-01-17 DIAGNOSIS — B9620 Unspecified Escherichia coli [E. coli] as the cause of diseases classified elsewhere: Secondary | ICD-10-CM | POA: Diagnosis present

## 2020-01-17 DIAGNOSIS — I651 Occlusion and stenosis of basilar artery: Secondary | ICD-10-CM | POA: Diagnosis present

## 2020-01-17 DIAGNOSIS — I6529 Occlusion and stenosis of unspecified carotid artery: Secondary | ICD-10-CM | POA: Diagnosis present

## 2020-01-17 DIAGNOSIS — I63313 Cerebral infarction due to thrombosis of bilateral middle cerebral arteries: Secondary | ICD-10-CM | POA: Diagnosis not present

## 2020-01-17 DIAGNOSIS — Z8673 Personal history of transient ischemic attack (TIA), and cerebral infarction without residual deficits: Secondary | ICD-10-CM | POA: Diagnosis not present

## 2020-01-17 DIAGNOSIS — R29810 Facial weakness: Secondary | ICD-10-CM | POA: Diagnosis present

## 2020-01-17 DIAGNOSIS — I6389 Other cerebral infarction: Secondary | ICD-10-CM | POA: Diagnosis not present

## 2020-01-17 DIAGNOSIS — I639 Cerebral infarction, unspecified: Principal | ICD-10-CM | POA: Diagnosis present

## 2020-01-17 DIAGNOSIS — R269 Unspecified abnormalities of gait and mobility: Secondary | ICD-10-CM | POA: Diagnosis present

## 2020-01-17 DIAGNOSIS — Z20822 Contact with and (suspected) exposure to covid-19: Secondary | ICD-10-CM | POA: Diagnosis present

## 2020-01-17 DIAGNOSIS — Z9119 Patient's noncompliance with other medical treatment and regimen: Secondary | ICD-10-CM

## 2020-01-17 DIAGNOSIS — Z79899 Other long term (current) drug therapy: Secondary | ICD-10-CM

## 2020-01-17 DIAGNOSIS — Z888 Allergy status to other drugs, medicaments and biological substances status: Secondary | ICD-10-CM | POA: Diagnosis not present

## 2020-01-17 DIAGNOSIS — Z9114 Patient's other noncompliance with medication regimen: Secondary | ICD-10-CM | POA: Diagnosis not present

## 2020-01-17 DIAGNOSIS — Z7982 Long term (current) use of aspirin: Secondary | ICD-10-CM

## 2020-01-17 LAB — DIFFERENTIAL
Abs Immature Granulocytes: 0.03 10*3/uL (ref 0.00–0.07)
Basophils Absolute: 0 10*3/uL (ref 0.0–0.1)
Basophils Relative: 0 %
Eosinophils Absolute: 0 10*3/uL (ref 0.0–0.5)
Eosinophils Relative: 0 %
Immature Granulocytes: 0 %
Lymphocytes Relative: 16 %
Lymphs Abs: 1.4 10*3/uL (ref 0.7–4.0)
Monocytes Absolute: 0.6 10*3/uL (ref 0.1–1.0)
Monocytes Relative: 7 %
Neutro Abs: 6.5 10*3/uL (ref 1.7–7.7)
Neutrophils Relative %: 77 %

## 2020-01-17 LAB — I-STAT CHEM 8, ED
BUN: 22 mg/dL (ref 8–23)
Calcium, Ion: 1.18 mmol/L (ref 1.15–1.40)
Chloride: 105 mmol/L (ref 98–111)
Creatinine, Ser: 1.6 mg/dL — ABNORMAL HIGH (ref 0.44–1.00)
Glucose, Bld: 142 mg/dL — ABNORMAL HIGH (ref 70–99)
HCT: 45 % (ref 36.0–46.0)
Hemoglobin: 15.3 g/dL — ABNORMAL HIGH (ref 12.0–15.0)
Potassium: 3.7 mmol/L (ref 3.5–5.1)
Sodium: 143 mmol/L (ref 135–145)
TCO2: 26 mmol/L (ref 22–32)

## 2020-01-17 LAB — COMPREHENSIVE METABOLIC PANEL
ALT: 17 U/L (ref 0–44)
AST: 25 U/L (ref 15–41)
Albumin: 3.7 g/dL (ref 3.5–5.0)
Alkaline Phosphatase: 91 U/L (ref 38–126)
Anion gap: 11 (ref 5–15)
BUN: 20 mg/dL (ref 8–23)
CO2: 26 mmol/L (ref 22–32)
Calcium: 9.6 mg/dL (ref 8.9–10.3)
Chloride: 104 mmol/L (ref 98–111)
Creatinine, Ser: 1.62 mg/dL — ABNORMAL HIGH (ref 0.44–1.00)
GFR calc Af Amer: 35 mL/min — ABNORMAL LOW (ref >60)
GFR calc non Af Amer: 30 mL/min — ABNORMAL LOW (ref >60)
Glucose, Bld: 148 mg/dL — ABNORMAL HIGH (ref 70–99)
Potassium: 3.6 mmol/L (ref 3.5–5.1)
Sodium: 141 mmol/L (ref 135–145)
Total Bilirubin: 0.7 mg/dL (ref 0.3–1.2)
Total Protein: 6.8 g/dL (ref 6.5–8.1)

## 2020-01-17 LAB — CBC
HCT: 47 % — ABNORMAL HIGH (ref 36.0–46.0)
HCT: 41.9 % (ref 36.0–46.0)
Hemoglobin: 15.3 g/dL — ABNORMAL HIGH (ref 12.0–15.0)
Hemoglobin: 13.7 g/dL (ref 12.0–15.0)
MCH: 30.5 pg (ref 26.0–34.0)
MCH: 30.4 pg (ref 26.0–34.0)
MCHC: 32.6 g/dL (ref 30.0–36.0)
MCHC: 32.7 g/dL (ref 30.0–36.0)
MCV: 93.8 fL (ref 80.0–100.0)
MCV: 93.1 fL (ref 80.0–100.0)
Platelets: 288 10*3/uL (ref 150–400)
Platelets: 232 10*3/uL (ref 150–400)
RBC: 5.01 MIL/uL (ref 3.87–5.11)
RBC: 4.5 MIL/uL (ref 3.87–5.11)
RDW: 13.5 % (ref 11.5–15.5)
RDW: 13.6 % (ref 11.5–15.5)
WBC: 8.6 10*3/uL (ref 4.0–10.5)
WBC: 7 10*3/uL (ref 4.0–10.5)
nRBC: 0 % (ref 0.0–0.2)
nRBC: 0 % (ref 0.0–0.2)

## 2020-01-17 LAB — CREATININE, SERUM
Creatinine, Ser: 1.42 mg/dL — ABNORMAL HIGH (ref 0.44–1.00)
GFR calc Af Amer: 41 mL/min — ABNORMAL LOW (ref >60)
GFR calc non Af Amer: 35 mL/min — ABNORMAL LOW (ref >60)

## 2020-01-17 LAB — SARS CORONAVIRUS 2 BY RT PCR (HOSPITAL ORDER, PERFORMED IN ~~LOC~~ HOSPITAL LAB): SARS Coronavirus 2: NEGATIVE

## 2020-01-17 LAB — PROTIME-INR
INR: 1 (ref 0.8–1.2)
Prothrombin Time: 12.4 seconds (ref 11.4–15.2)

## 2020-01-17 LAB — HEMOGLOBIN A1C
Hgb A1c MFr Bld: 6.4 % — ABNORMAL HIGH (ref 4.8–5.6)
Mean Plasma Glucose: 136.98 mg/dL

## 2020-01-17 LAB — APTT: aPTT: 26 seconds (ref 24–36)

## 2020-01-17 LAB — CBG MONITORING, ED: Glucose-Capillary: 110 mg/dL — ABNORMAL HIGH (ref 70–99)

## 2020-01-17 IMAGING — MR MR MRA NECK W/O CM
9 of 13 series · 19 of 48 positions shown · non-contrast
Comparison: Recent CT imaging

CLINICAL DATA: Confusion and gait difficulty



[Series 2: ax (id) · axial · 1.0mm · 0.43mm/px · 1 of 184 slices shown]
[im 1/184]
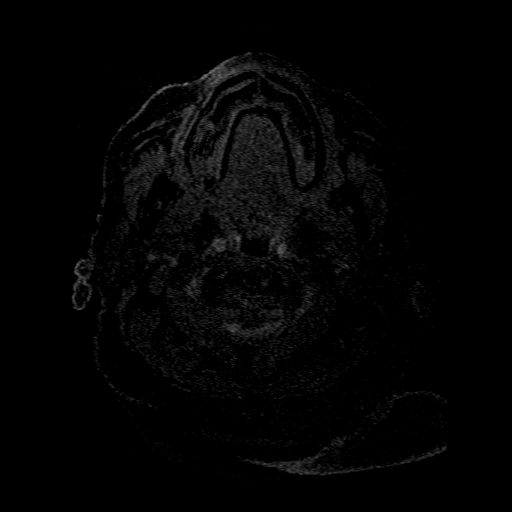

[Series 3: DWI · axial · 3.0mm · 0.94mm/px · z∈[-32,+113]mm · 5 of 100 slices shown (1 of 2)]
[im 1/100]
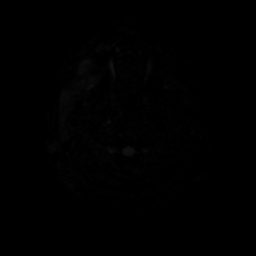
[im 25/100]
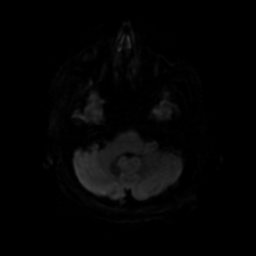
[im 50/100]
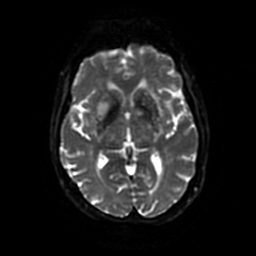
[im 75/100]
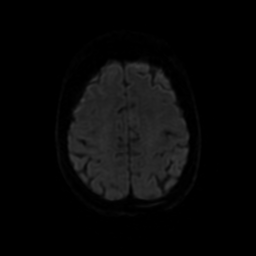
[im 100/100]
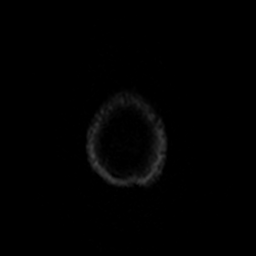

[Series 4: DWI · coronal · 4.0mm · 0.94mm/px · 4 of 74 slices shown (2 of 2)]
[im 1/74]
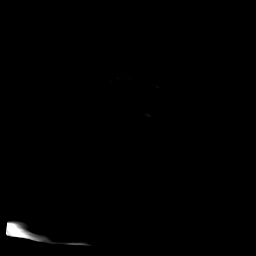
[im 25/74]
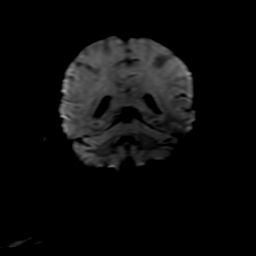
[im 49/74]
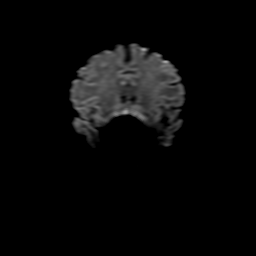
[im 74/74]
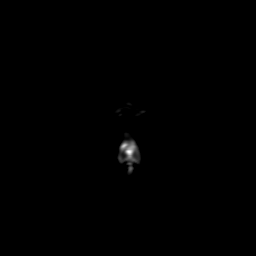

[Series 5: FLAIR · sagittal · 5.0mm · 0.23mm/px · 1 of 22 slices shown (1 of 2)]
[im 1/22]
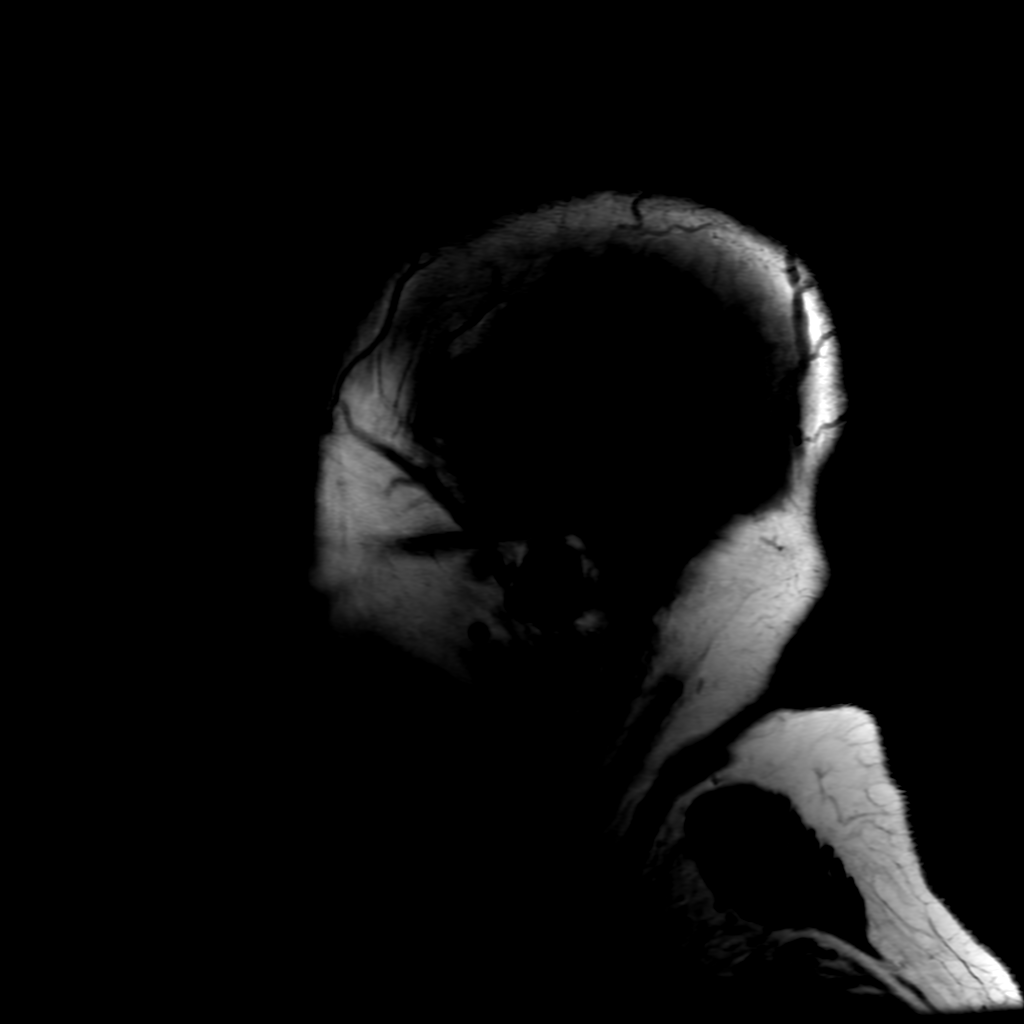

[Series 6: T2 · axial · 5.0mm · 0.23mm/px · 1 of 26 slices shown (1 of 2)]
[im 1/26]
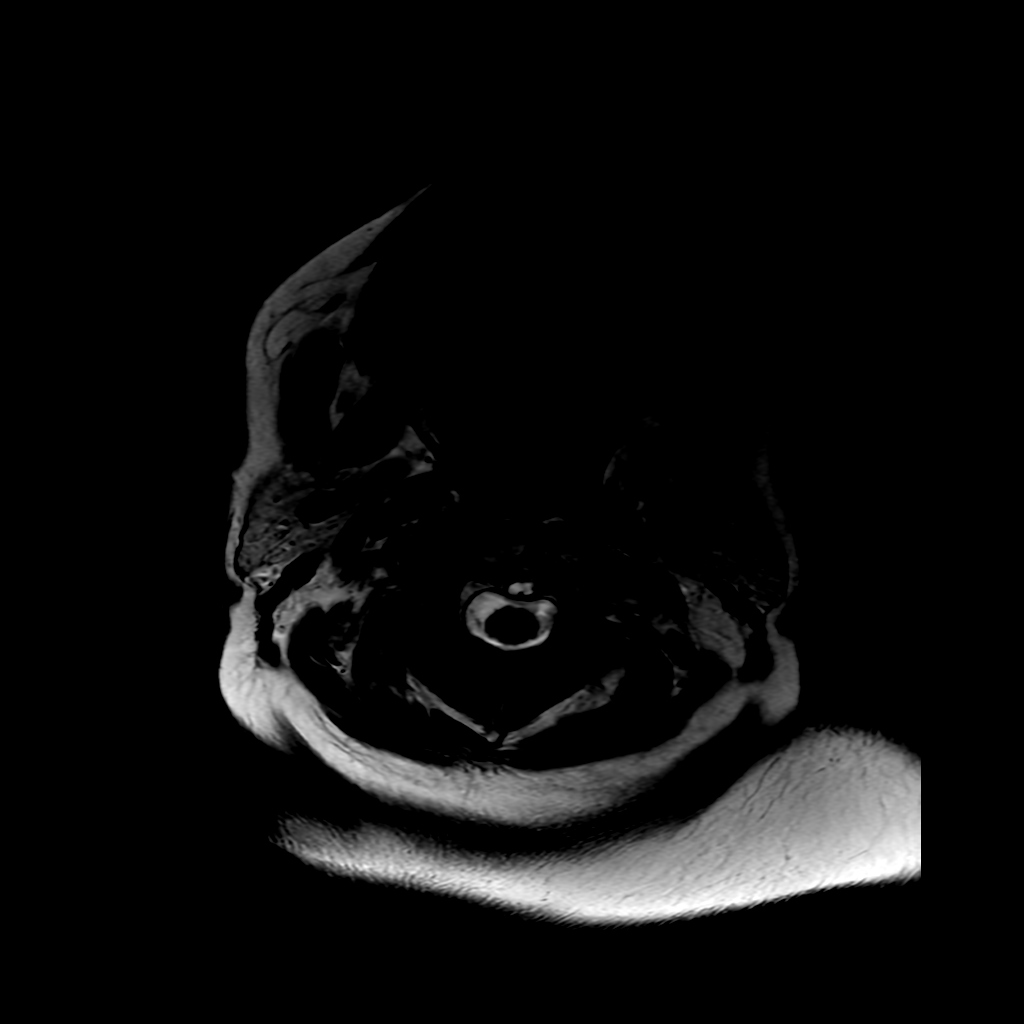

[Series 8: FLAIR · axial · 3.0mm · 0.47mm/px · 1 of 26 slices shown (2 of 2)]
[im 1/26]
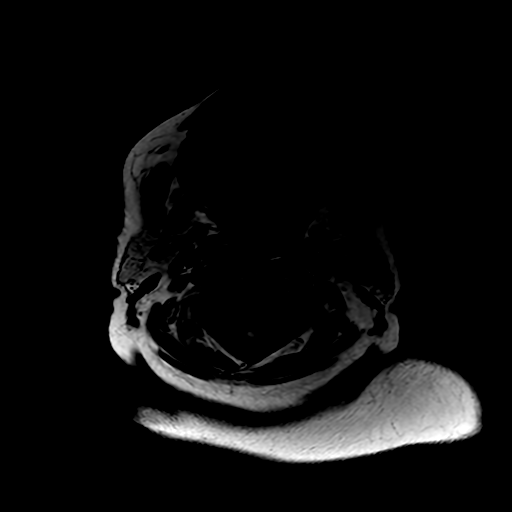

[Series 12: T2 · coronal · 5.0mm · 0.20mm/px · 1 of 26 slices shown (2 of 2)]
[im 1/26]
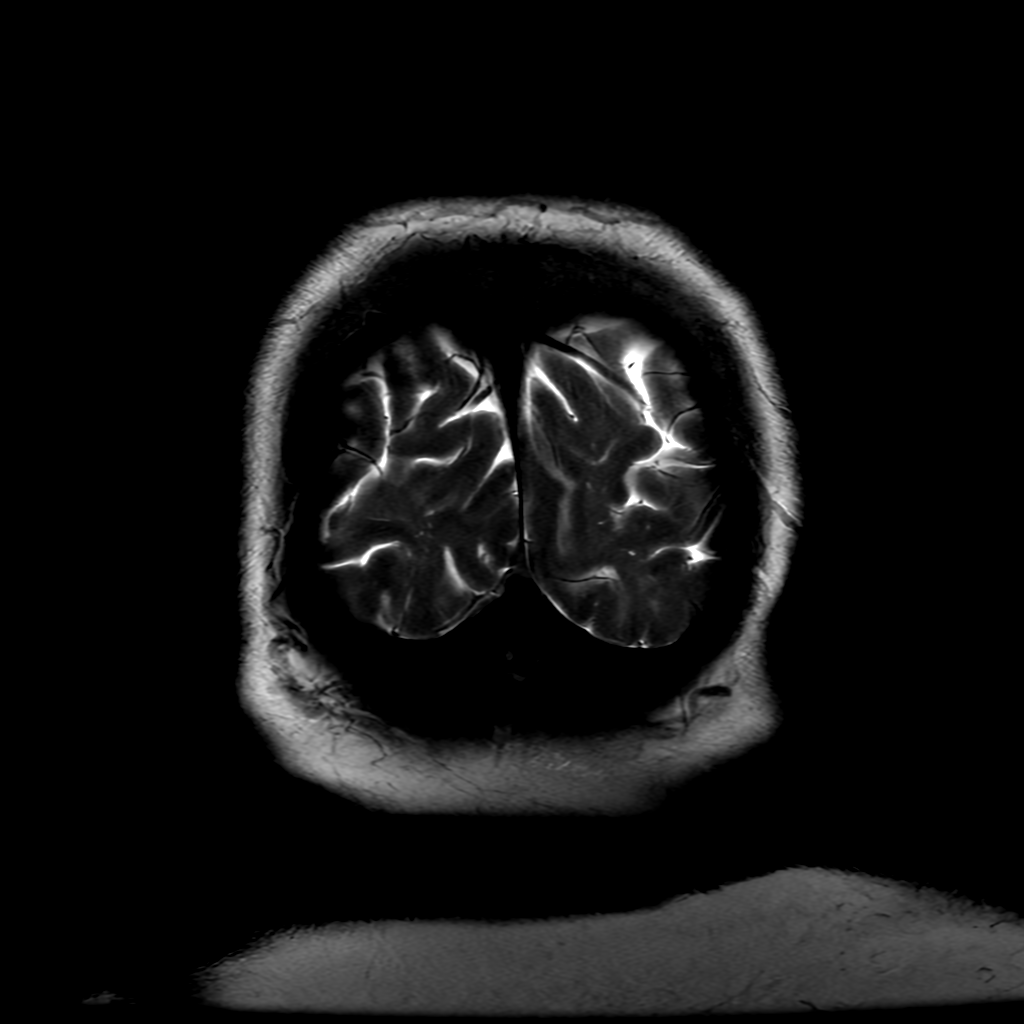

[Series 350: ADC · axial · 3.0mm · 0.94mm/px · z∈[-32,+113]mm · 3 of 50 slices shown (1 of 2)]
[im 1/50]
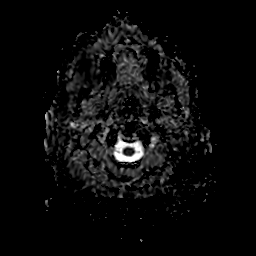
[im 25/50]
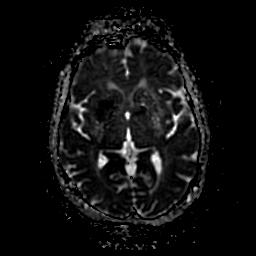
[im 50/50]
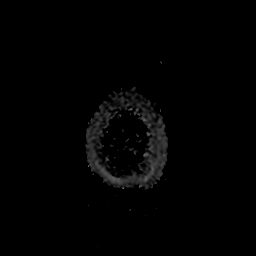

[Series 450: ADC · coronal · 4.0mm · 0.94mm/px · 2 of 37 slices shown (2 of 2)]
[im 1/37]
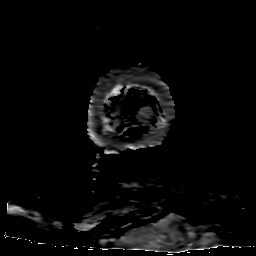
[im 37/37]
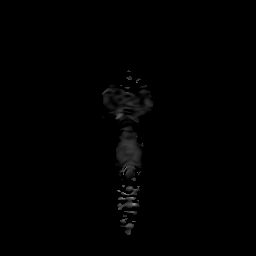

[19 of 48 positions shown; findings below may reference images not displayed]

FINDINGS: MRI HEAD

Brain: There is reduced diffusion involving the right caudate and
lentiform nuclei and adjacent white matter as seen on CT.
Additionally, there are small foci of reduced diffusion
contralaterally involving the internal capsule and lentiform
nucleus.

No evidence of intracranial hemorrhage. No significant mass effect.
Additional patchy and confluent areas of T2 hyperintensity in the
supratentorial and pontine white matter are nonspecific but probably
reflect moderate to advanced chronic microvascular ischemic changes.
There is no hydrocephalus. No extra-axial fluid collection. No
intracranial mass.

Vascular: Loss of the expected flow void of the visualized left
vertebral artery. Major vessel flow voids at the skull base are
otherwise preserved.

Skull and upper cervical spine: Normal marrow signal is preserved.

Sinuses/Orbits: Paranasal sinuses are aerated. Orbits are
unremarkable.

Other: Sella is unremarkable.  Mastoid air cells are clear.

MRA HEAD

Intracranial internal carotid arteries are patent with
atherosclerotic irregularity. Middle and anterior cerebral arteries
are patent. Right A1 ACA is dominant. Left A1 ACA is hypoplastic or
absent.

Intracranial right vertebral artery is patent. There is no flow
related enhancement of the intracranial left vertebral artery. There
is a patent left AICA/PICA. Basilar artery is patent with mild
stenosis proximally. Posterior cerebral arteries are patent.
Bilateral posterior communicating arteries are present and are the
predominant supply of the posterior cerebral arteries.

MRA NECK

Common, internal, and external carotid arteries are patent. There is
atherosclerotic plaque at the ICA origins with minimal stenosis.
Extracranial right vertebral artery is patent. There is no flow
related enhancement of the left vertebral artery.
IMPRESSION: Acute infarcts of the right basal ganglia and adjacent white matter.
Additional small acute infarcts of the left internal capsule and
lentiform nucleus.

Moderate to advanced chronic microvascular ischemic changes.

Likely chronically occluded left vertebral artery. Mild proximal
basilar artery stenosis. No hemodynamically significant stenosis at
the ICA origins.

## 2020-01-17 IMAGING — MR MR MRA HEAD W/O CM
9 of 13 series · 19 of 48 positions shown · non-contrast
Comparison: Recent CT imaging

CLINICAL DATA: Confusion and gait difficulty



[Series 2: ax (id) · axial · 1.0mm · 0.43mm/px · 1 of 184 slices shown]
[im 1/184]
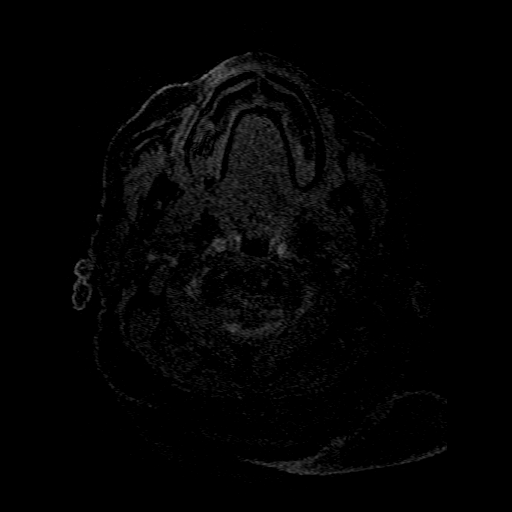

[Series 3: DWI · axial · 3.0mm · 0.94mm/px · z∈[-32,+113]mm · 5 of 100 slices shown (1 of 2)]
[im 1/100]
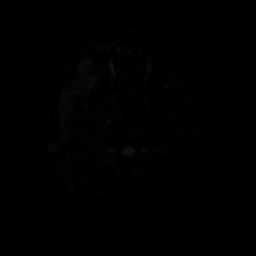
[im 25/100]
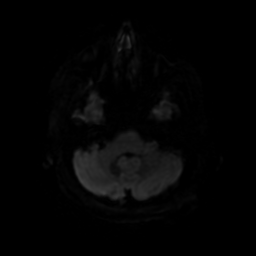
[im 50/100]
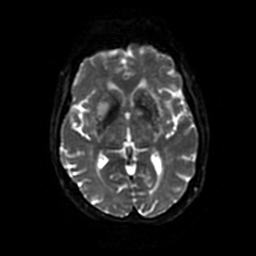
[im 75/100]
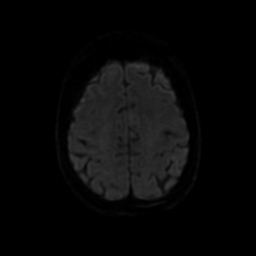
[im 100/100]
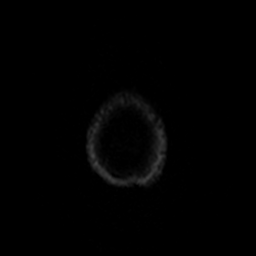

[Series 4: DWI · coronal · 4.0mm · 0.94mm/px · 4 of 74 slices shown (2 of 2)]
[im 1/74]
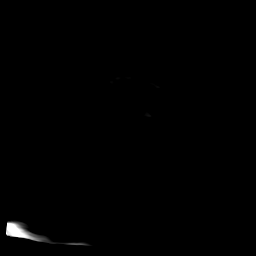
[im 25/74]
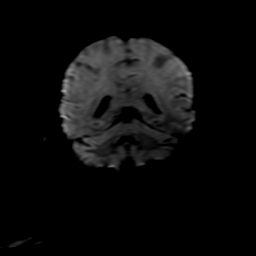
[im 49/74]
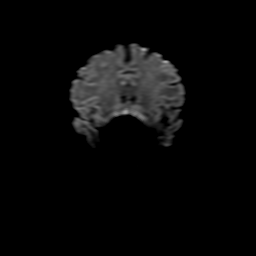
[im 74/74]
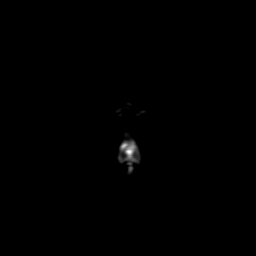

[Series 5: FLAIR · sagittal · 5.0mm · 0.23mm/px · 1 of 22 slices shown (1 of 2)]
[im 1/22]
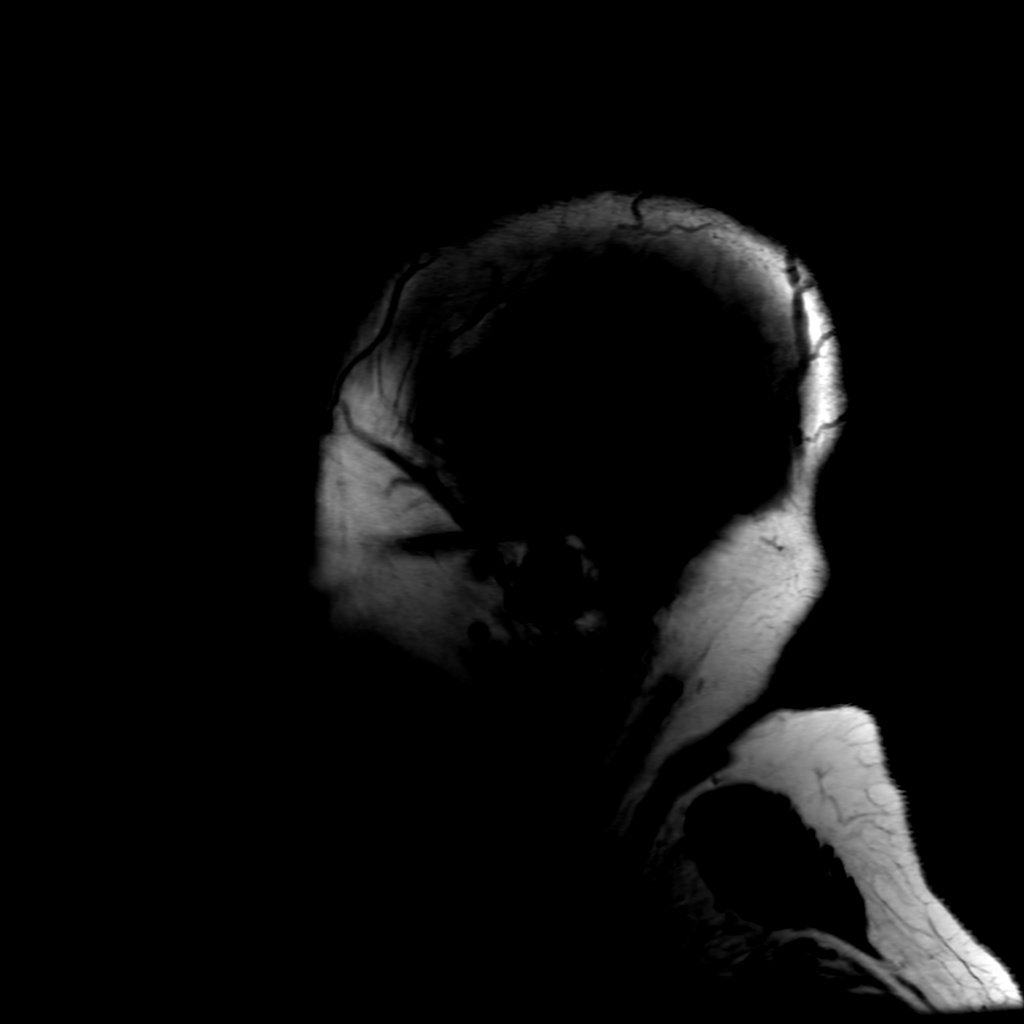

[Series 6: T2 · axial · 5.0mm · 0.23mm/px · 1 of 26 slices shown (1 of 2)]
[im 1/26]
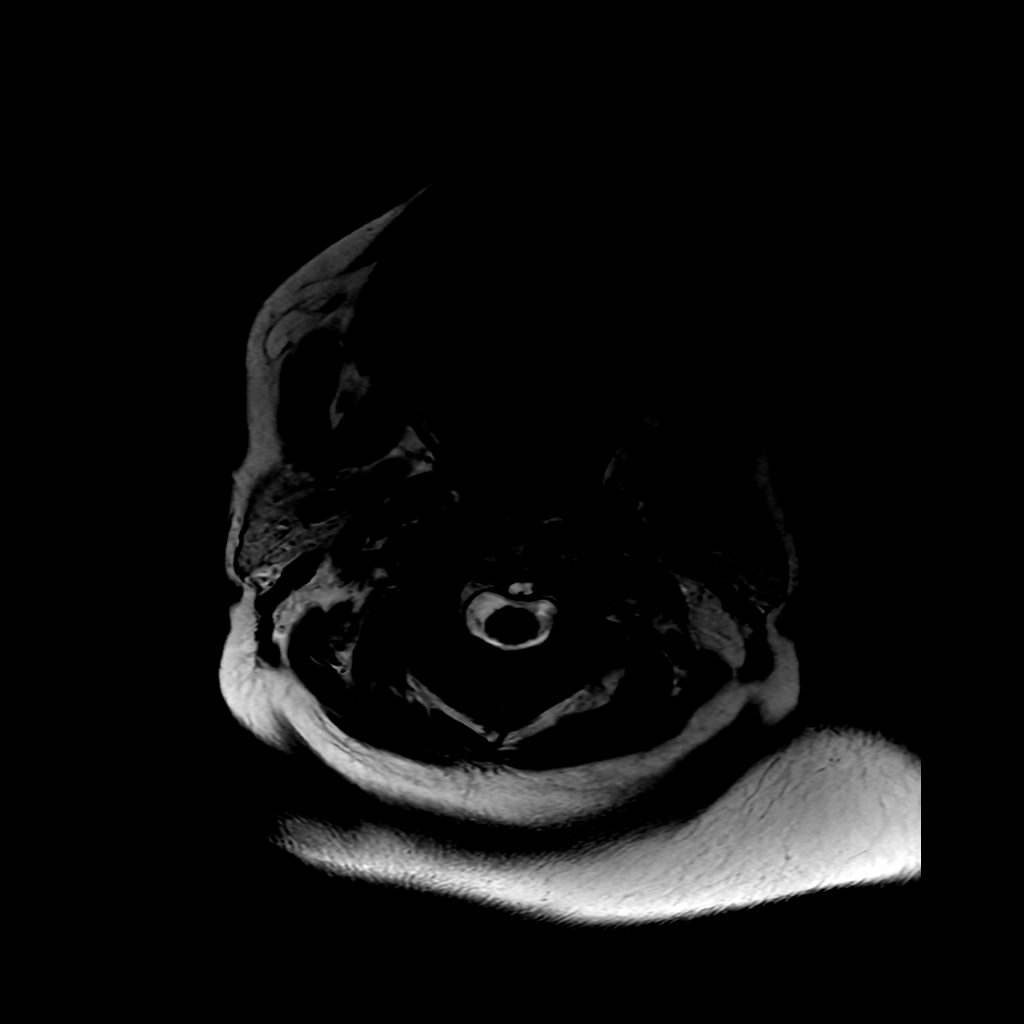

[Series 8: FLAIR · axial · 3.0mm · 0.47mm/px · 1 of 26 slices shown (2 of 2)]
[im 1/26]
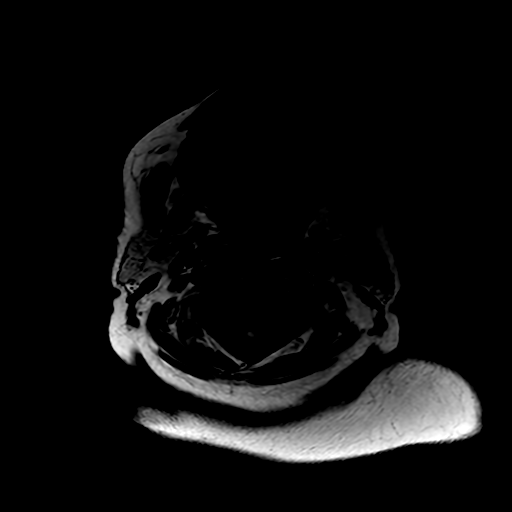

[Series 12: T2 · coronal · 5.0mm · 0.20mm/px · 1 of 26 slices shown (2 of 2)]
[im 1/26]
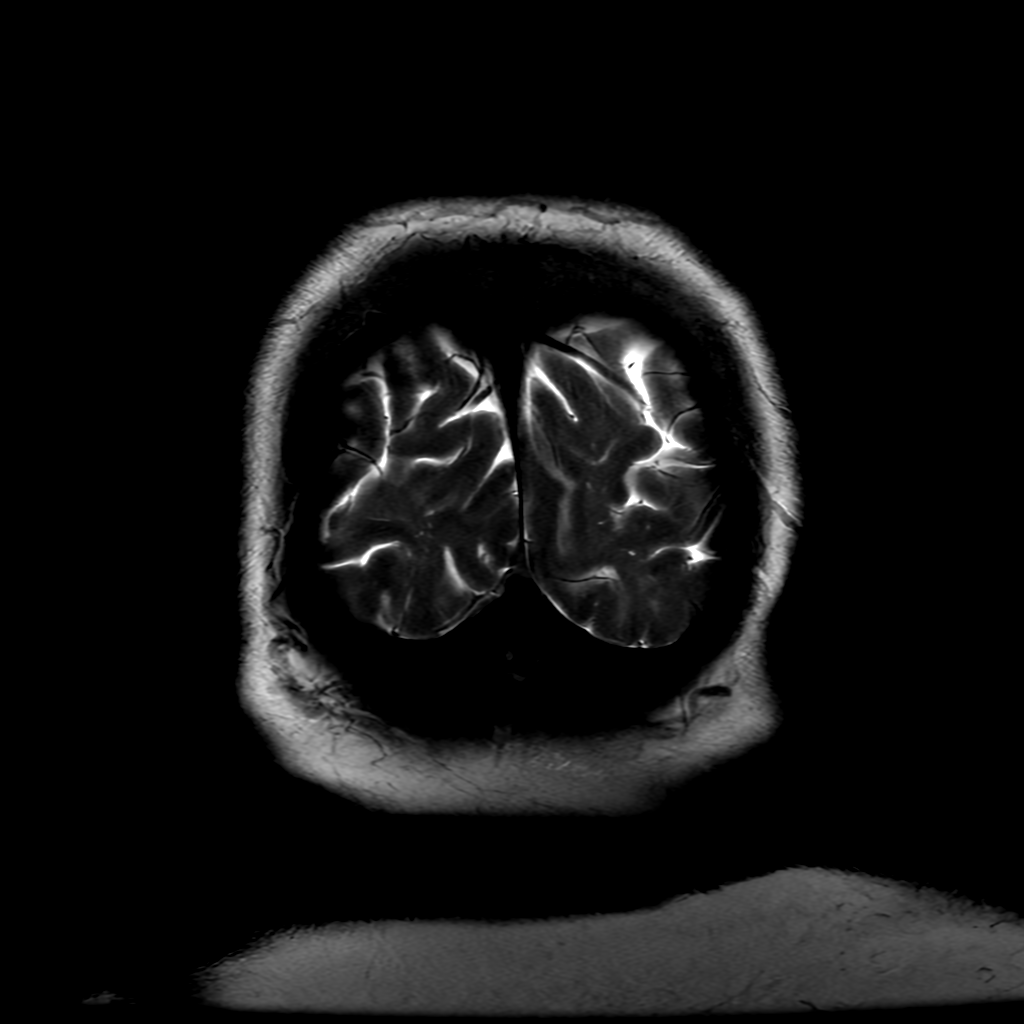

[Series 350: ADC · axial · 3.0mm · 0.94mm/px · z∈[-32,+113]mm · 3 of 50 slices shown (1 of 2)]
[im 1/50]
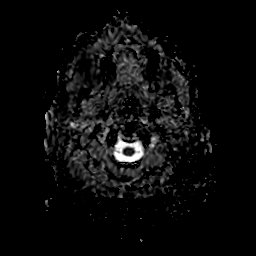
[im 25/50]
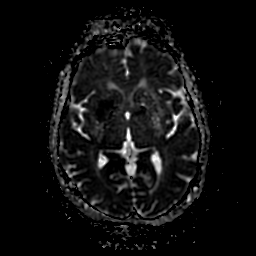
[im 50/50]
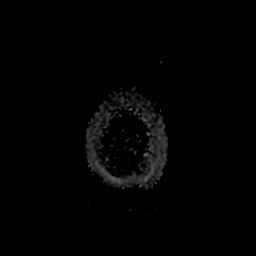

[Series 450: ADC · coronal · 4.0mm · 0.94mm/px · 2 of 37 slices shown (2 of 2)]
[im 1/37]
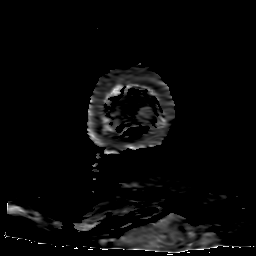
[im 37/37]
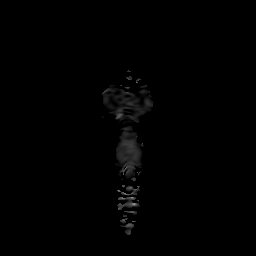

[19 of 48 positions shown; findings below may reference images not displayed]

FINDINGS: MRI HEAD

Brain: There is reduced diffusion involving the right caudate and
lentiform nuclei and adjacent white matter as seen on CT.
Additionally, there are small foci of reduced diffusion
contralaterally involving the internal capsule and lentiform
nucleus.

No evidence of intracranial hemorrhage. No significant mass effect.
Additional patchy and confluent areas of T2 hyperintensity in the
supratentorial and pontine white matter are nonspecific but probably
reflect moderate to advanced chronic microvascular ischemic changes.
There is no hydrocephalus. No extra-axial fluid collection. No
intracranial mass.

Vascular: Loss of the expected flow void of the visualized left
vertebral artery. Major vessel flow voids at the skull base are
otherwise preserved.

Skull and upper cervical spine: Normal marrow signal is preserved.

Sinuses/Orbits: Paranasal sinuses are aerated. Orbits are
unremarkable.

Other: Sella is unremarkable.  Mastoid air cells are clear.

MRA HEAD

Intracranial internal carotid arteries are patent with
atherosclerotic irregularity. Middle and anterior cerebral arteries
are patent. Right A1 ACA is dominant. Left A1 ACA is hypoplastic or
absent.

Intracranial right vertebral artery is patent. There is no flow
related enhancement of the intracranial left vertebral artery. There
is a patent left AICA/PICA. Basilar artery is patent with mild
stenosis proximally. Posterior cerebral arteries are patent.
Bilateral posterior communicating arteries are present and are the
predominant supply of the posterior cerebral arteries.

MRA NECK

Common, internal, and external carotid arteries are patent. There is
atherosclerotic plaque at the ICA origins with minimal stenosis.
Extracranial right vertebral artery is patent. There is no flow
related enhancement of the left vertebral artery.
IMPRESSION: Acute infarcts of the right basal ganglia and adjacent white matter.
Additional small acute infarcts of the left internal capsule and
lentiform nucleus.

Moderate to advanced chronic microvascular ischemic changes.

Likely chronically occluded left vertebral artery. Mild proximal
basilar artery stenosis. No hemodynamically significant stenosis at
the ICA origins.

## 2020-01-17 IMAGING — CT CT HEAD W/O CM
4 series · 16 of 47 positions shown, 18 images · non-contrast
Comparison: [DATE]

CLINICAL DATA: Stroke, follow-up

EXAM:
CT HEAD WITHOUT CONTRAST
TECHNIQUE: Contiguous axial images were obtained from the base of the skull
through the vertex without intravenous contrast.

[Series 3: head wo · axial · 0.42mm/px · z∈[+1213,+1333]mm · 7 of 32 slices shown, 9 images]
[im 4/32  brain]
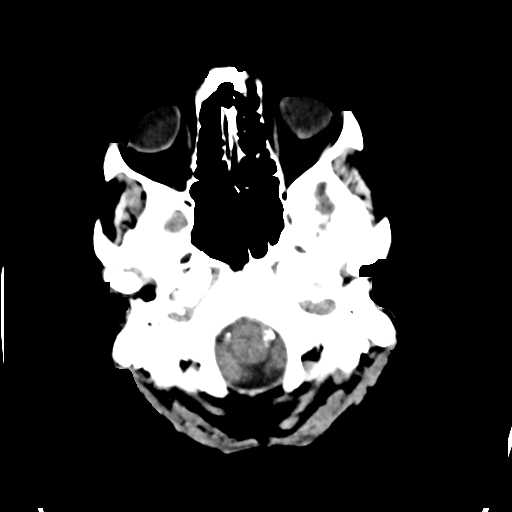
[im 4/32  bone]
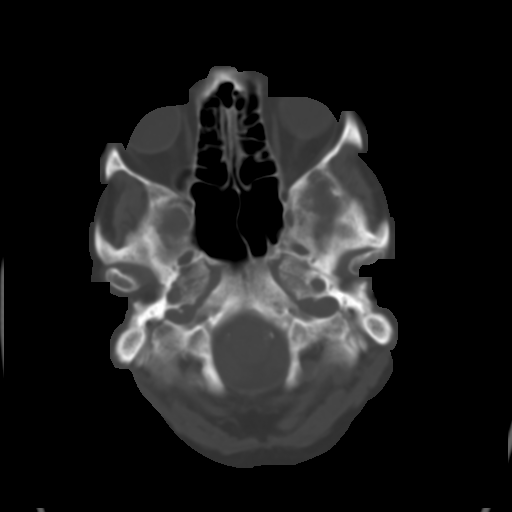
[im 8/32  brain]
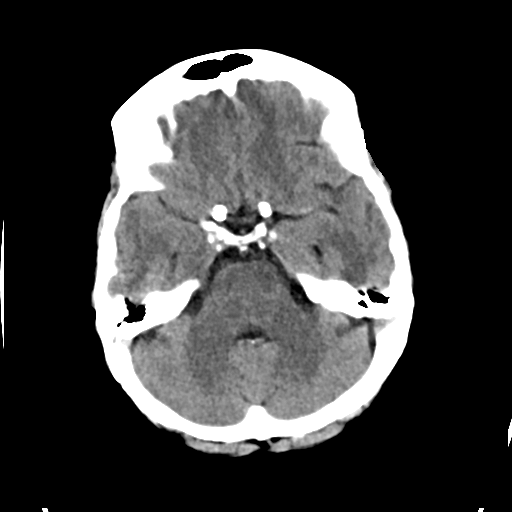
[im 12/32  brain]
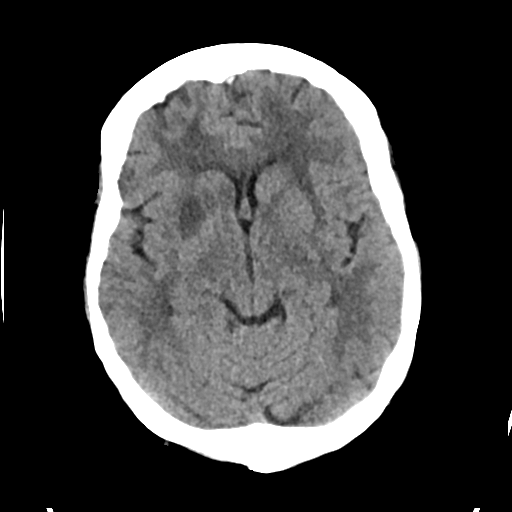
[im 16/32  brain]
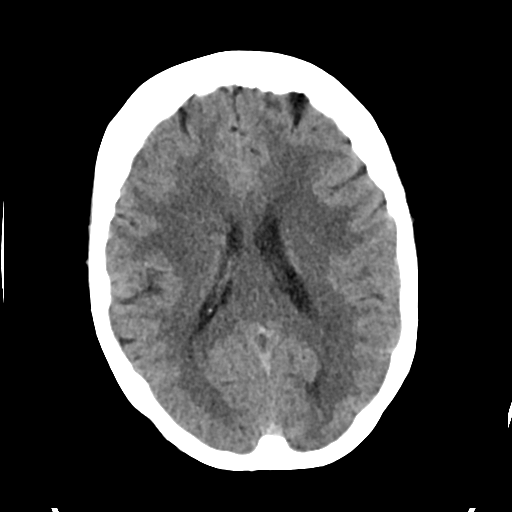
[im 20/32  brain]
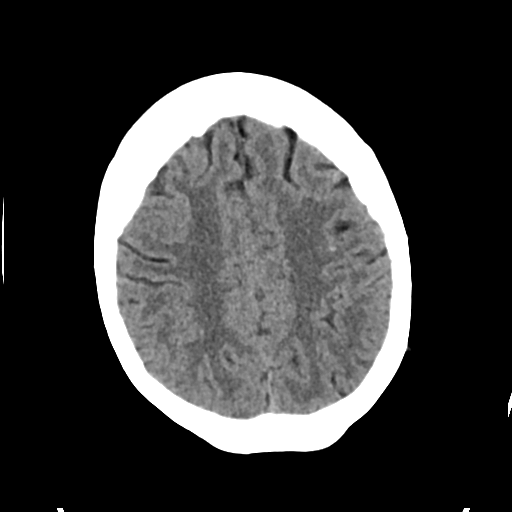
[im 20/32  bone]
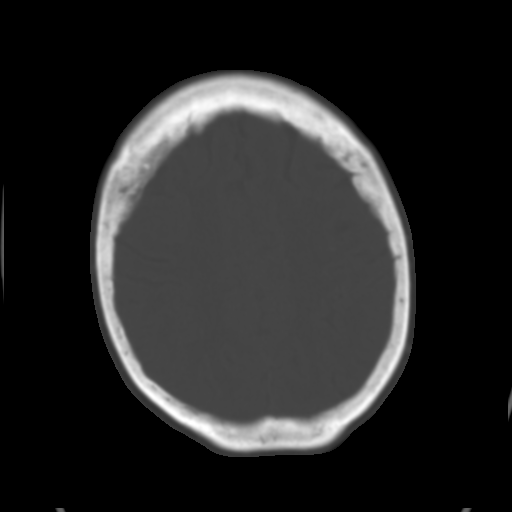
[im 24/32  brain]
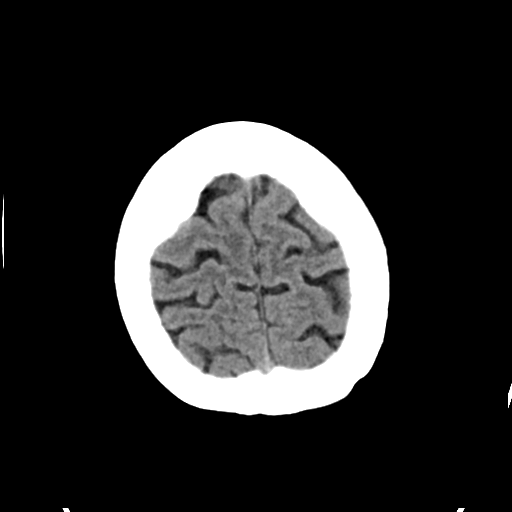
[im 28/32  brain]
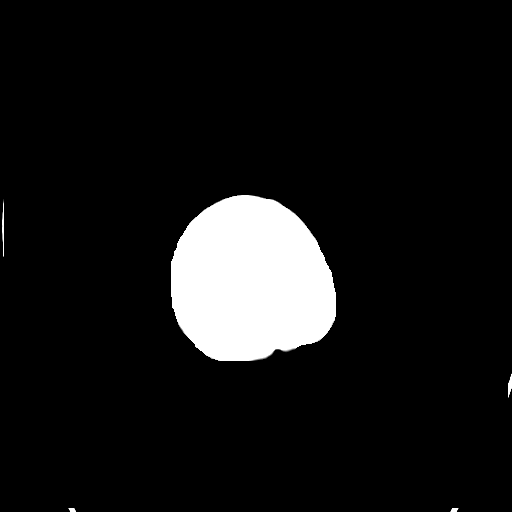

[Series 4: head bone · axial · 0.42mm/px · z∈[+1212,+1244]mm · 3 of 78 slices shown]
[im 8/78  bone]
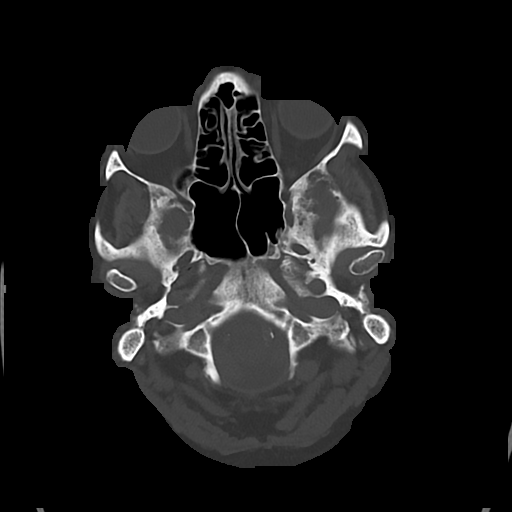
[im 16/78  bone]
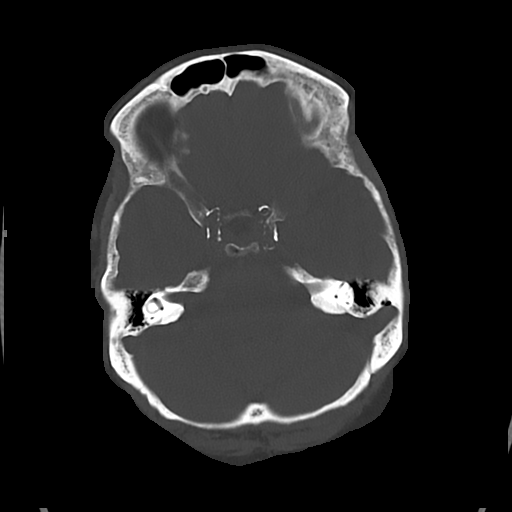
[im 24/78  bone]
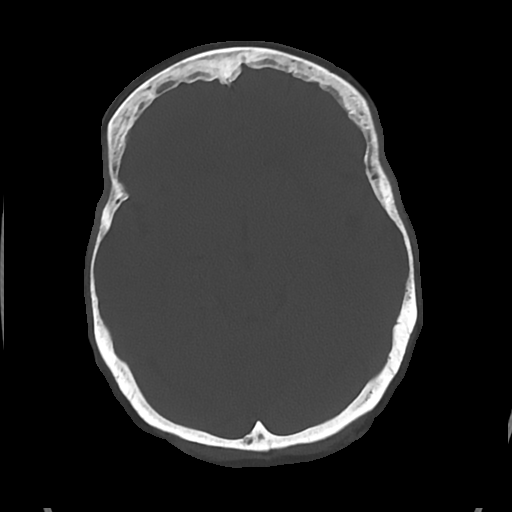

[Series 5: cor soft · coronal · 0.31mm/px · 3 of 68 slices shown]
[im 23/68  brain]
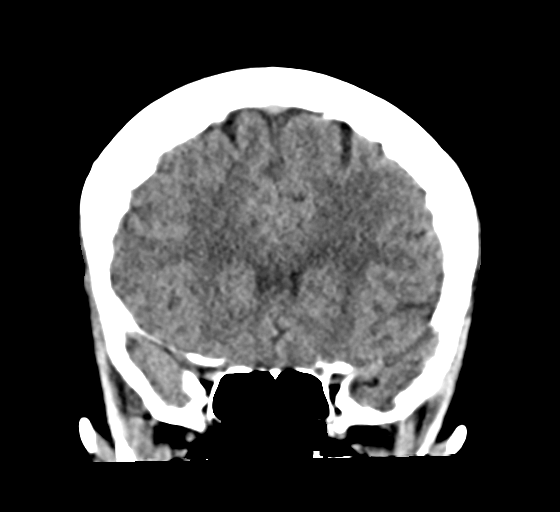
[im 30/68  brain]
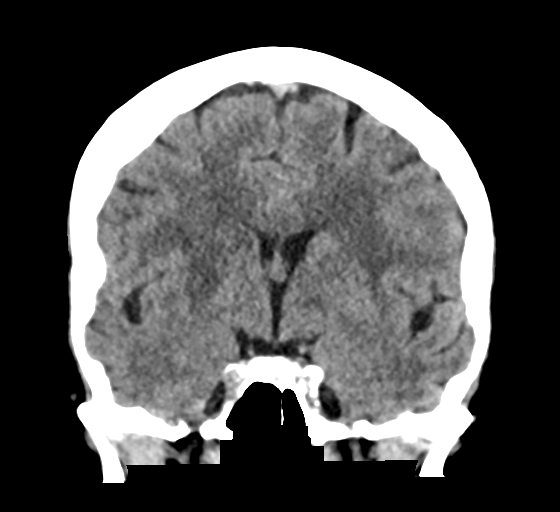
[im 38/68  brain]
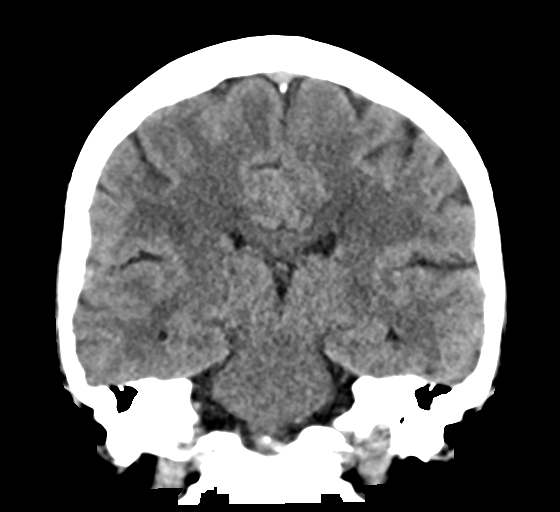

[Series 6: sag soft · sagittal · 0.31mm/px · 3 of 56 slices shown]
[im 19/56  brain]
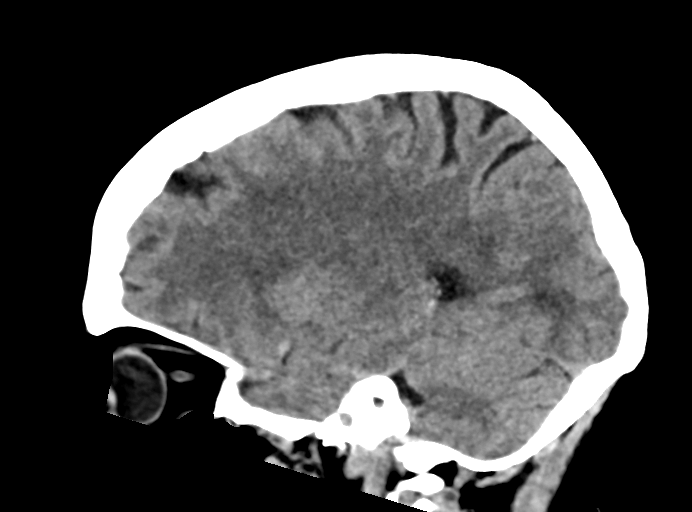
[im 28/56  brain]
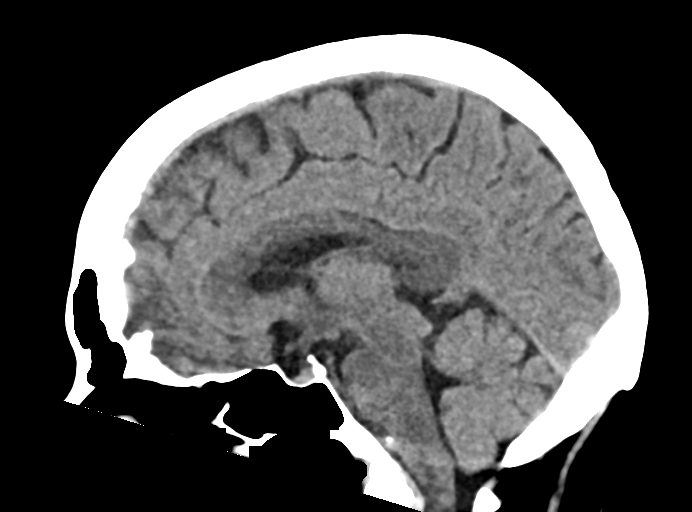
[im 37/56  brain]
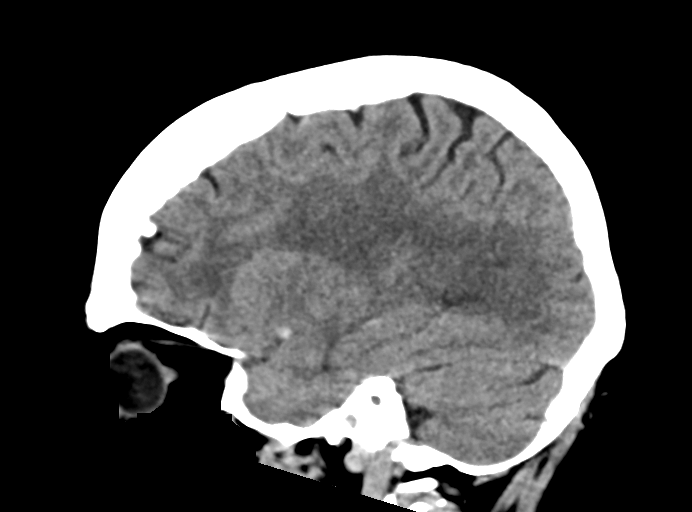

[16 of 47 positions shown; findings below may reference images not displayed]

FINDINGS: Brain: There is no acute intracranial hemorrhage or mass effect.
Hypoattenuation is again identified at the right caudate and
lentiform nucleus. No new loss of gray-white differentiation.
Additional patchy and confluent areas of hypoattenuation in the
supratentorial white matter are nonspecific but probably reflect
stable chronic microvascular ischemic changes. Ventricles are stable
in caliber.

Vascular: There is atherosclerotic calcification at the skull base.

Skull: Calvarium is unremarkable.

Sinuses/Orbits: No acute finding.

Other: None.
IMPRESSION: Evolving acute to subacute right basal ganglia infarct. No acute
intracranial hemorrhage.

## 2020-01-17 IMAGING — MR MR HEAD W/O CM
9 of 13 series · 19 of 48 positions shown · non-contrast
Comparison: Recent CT imaging

CLINICAL DATA: Confusion and gait difficulty



[Series 2: ax (id) · axial · 1.0mm · 0.43mm/px · 1 of 184 slices shown]
[im 1/184]
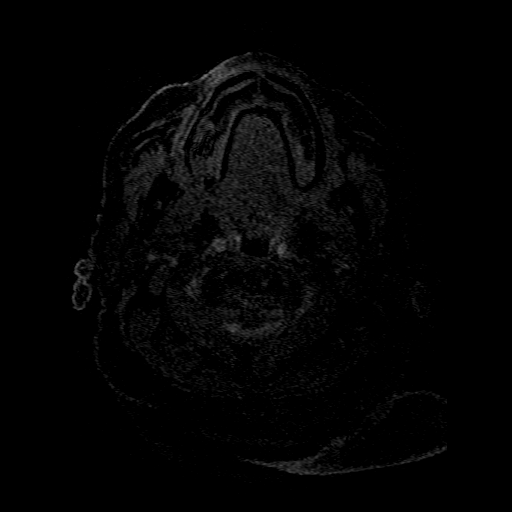

[Series 3: DWI · axial · 3.0mm · 0.94mm/px · z∈[-32,+113]mm · 5 of 100 slices shown (1 of 2)]
[im 1/100]
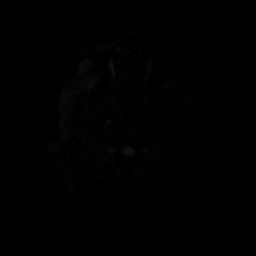
[im 25/100]
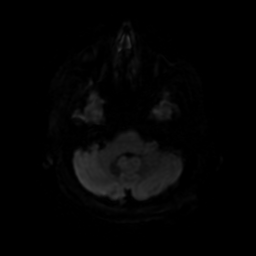
[im 50/100]
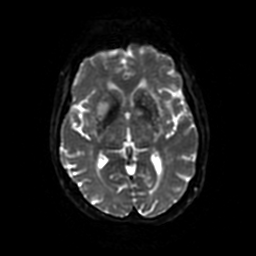
[im 75/100]
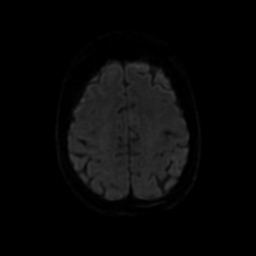
[im 100/100]
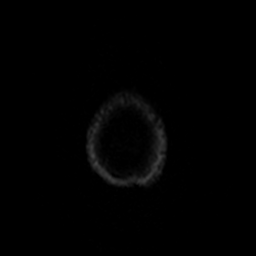

[Series 4: DWI · coronal · 4.0mm · 0.94mm/px · 4 of 74 slices shown (2 of 2)]
[im 1/74]
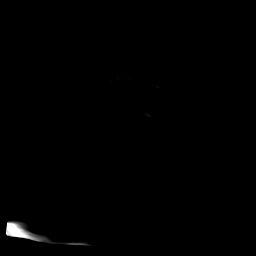
[im 25/74]
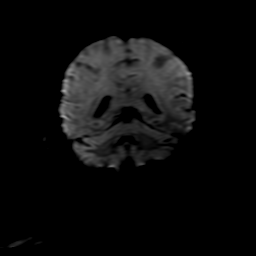
[im 49/74]
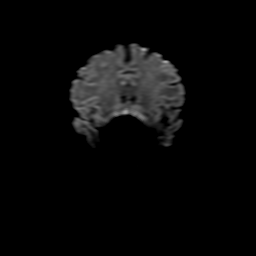
[im 74/74]
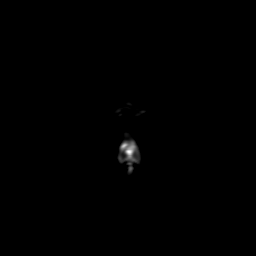

[Series 5: FLAIR · sagittal · 5.0mm · 0.23mm/px · 1 of 22 slices shown (1 of 2)]
[im 1/22]
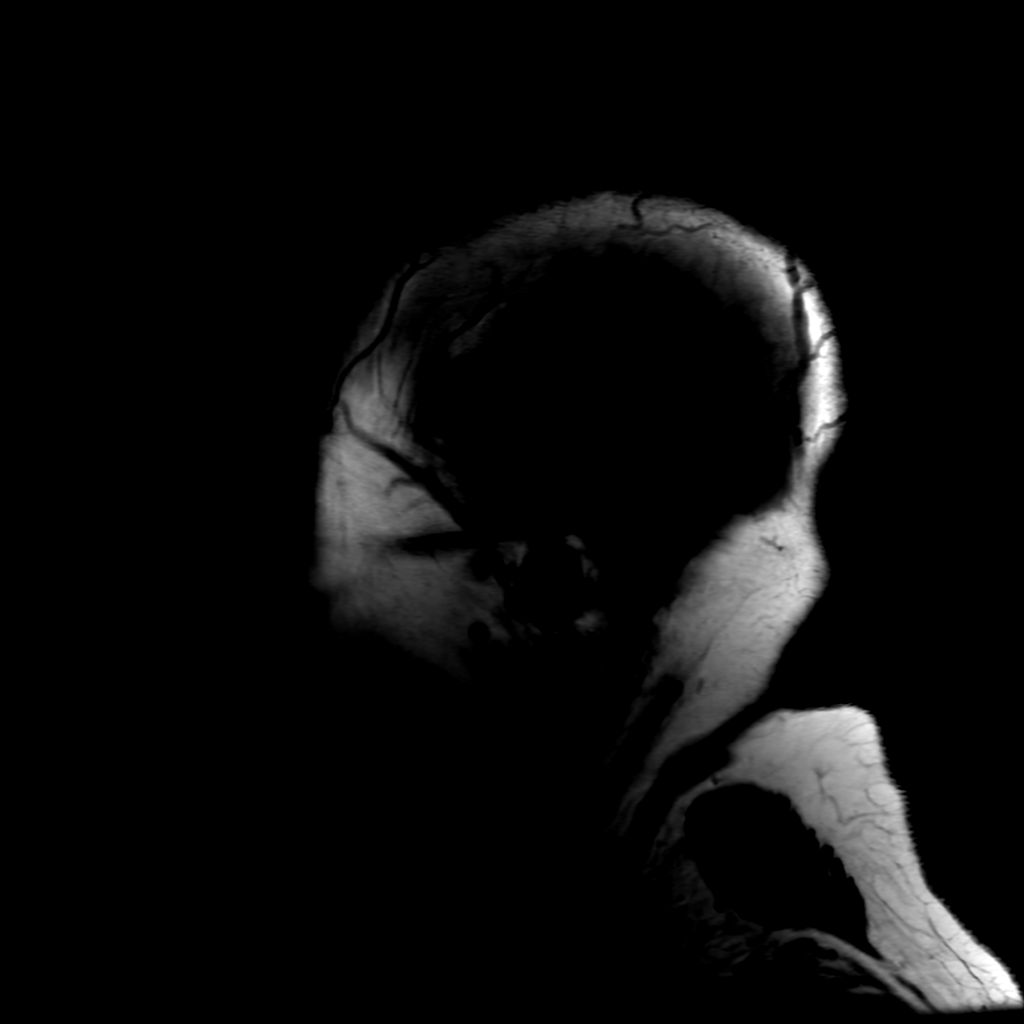

[Series 6: T2 · axial · 5.0mm · 0.23mm/px · 1 of 26 slices shown (1 of 2)]
[im 1/26]
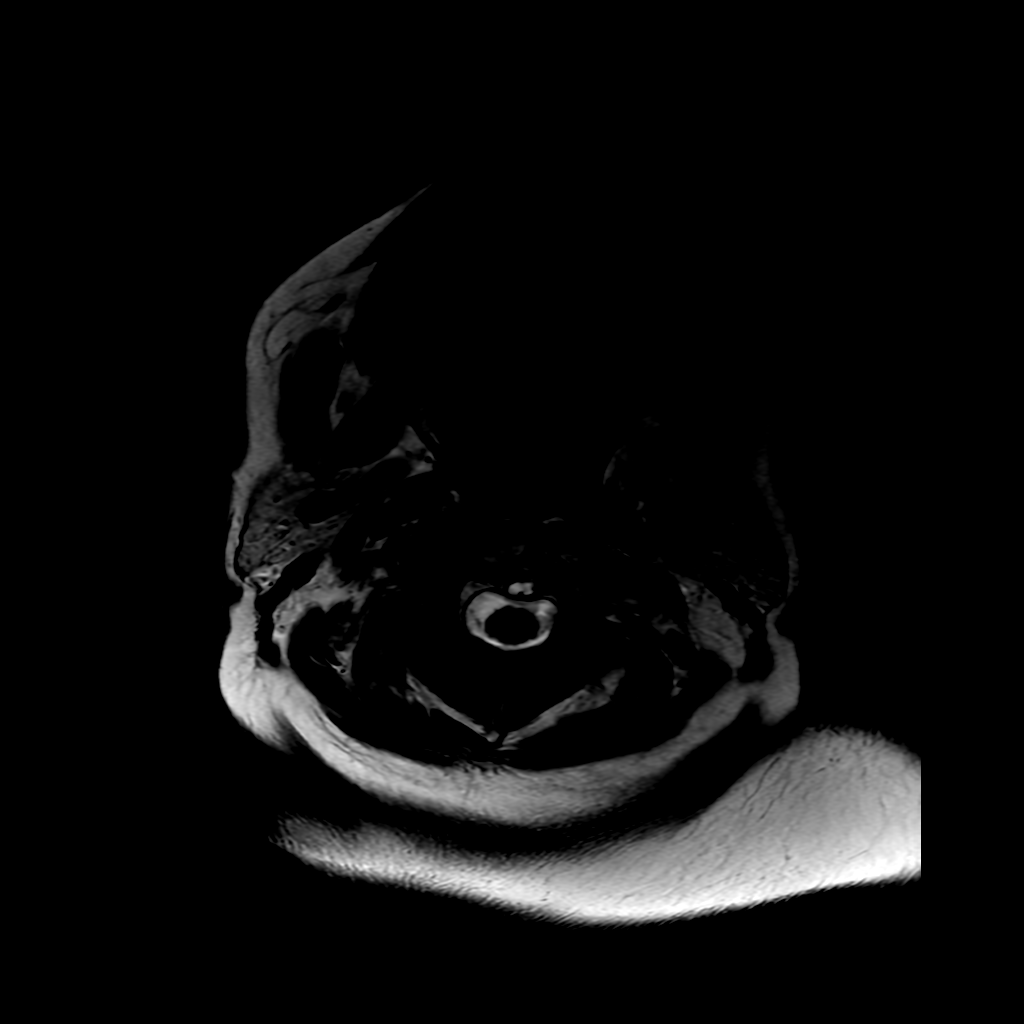

[Series 8: FLAIR · axial · 3.0mm · 0.47mm/px · 1 of 26 slices shown (2 of 2)]
[im 1/26]
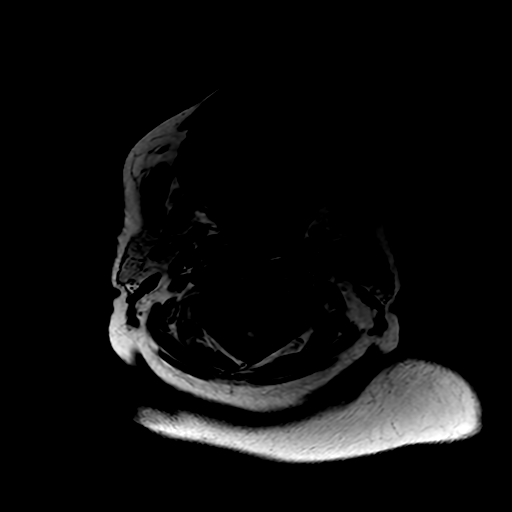

[Series 12: T2 · coronal · 5.0mm · 0.20mm/px · 1 of 26 slices shown (2 of 2)]
[im 1/26]
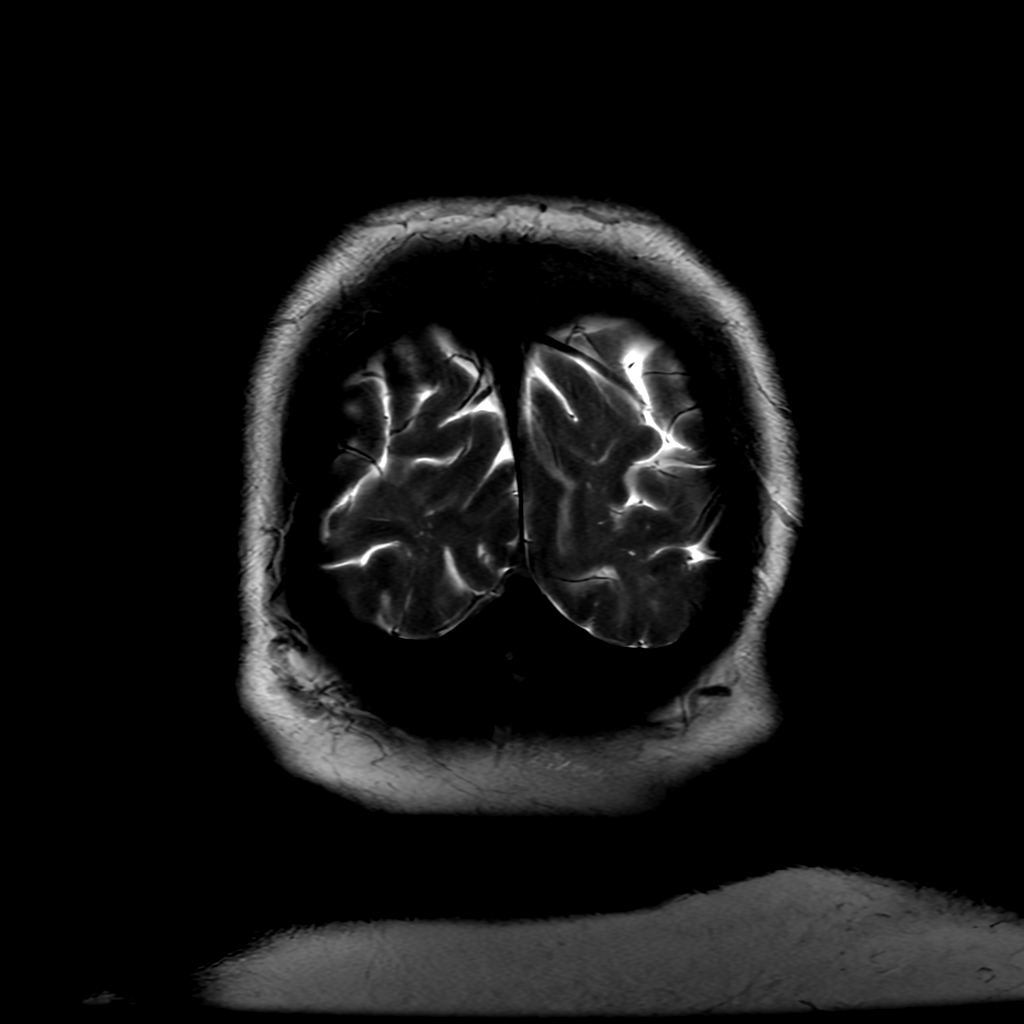

[Series 350: ADC · axial · 3.0mm · 0.94mm/px · z∈[-32,+113]mm · 3 of 50 slices shown (1 of 2)]
[im 1/50]
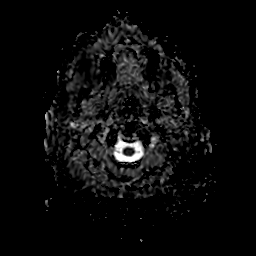
[im 25/50]
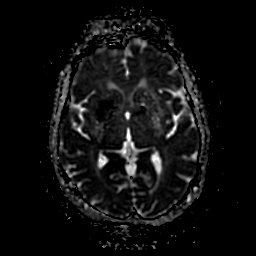
[im 50/50]
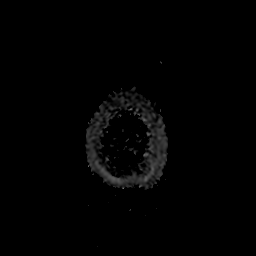

[Series 450: ADC · coronal · 4.0mm · 0.94mm/px · 2 of 37 slices shown (2 of 2)]
[im 1/37]
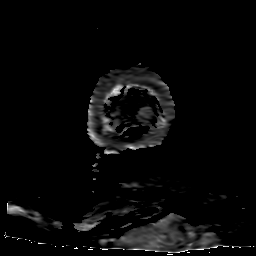
[im 37/37]
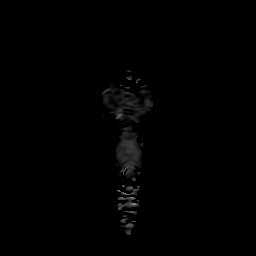

[19 of 48 positions shown; findings below may reference images not displayed]

FINDINGS: MRI HEAD

Brain: There is reduced diffusion involving the right caudate and
lentiform nuclei and adjacent white matter as seen on CT.
Additionally, there are small foci of reduced diffusion
contralaterally involving the internal capsule and lentiform
nucleus.

No evidence of intracranial hemorrhage. No significant mass effect.
Additional patchy and confluent areas of T2 hyperintensity in the
supratentorial and pontine white matter are nonspecific but probably
reflect moderate to advanced chronic microvascular ischemic changes.
There is no hydrocephalus. No extra-axial fluid collection. No
intracranial mass.

Vascular: Loss of the expected flow void of the visualized left
vertebral artery. Major vessel flow voids at the skull base are
otherwise preserved.

Skull and upper cervical spine: Normal marrow signal is preserved.

Sinuses/Orbits: Paranasal sinuses are aerated. Orbits are
unremarkable.

Other: Sella is unremarkable.  Mastoid air cells are clear.

MRA HEAD

Intracranial internal carotid arteries are patent with
atherosclerotic irregularity. Middle and anterior cerebral arteries
are patent. Right A1 ACA is dominant. Left A1 ACA is hypoplastic or
absent.

Intracranial right vertebral artery is patent. There is no flow
related enhancement of the intracranial left vertebral artery. There
is a patent left AICA/PICA. Basilar artery is patent with mild
stenosis proximally. Posterior cerebral arteries are patent.
Bilateral posterior communicating arteries are present and are the
predominant supply of the posterior cerebral arteries.

MRA NECK

Common, internal, and external carotid arteries are patent. There is
atherosclerotic plaque at the ICA origins with minimal stenosis.
Extracranial right vertebral artery is patent. There is no flow
related enhancement of the left vertebral artery.
IMPRESSION: Acute infarcts of the right basal ganglia and adjacent white matter.
Additional small acute infarcts of the left internal capsule and
lentiform nucleus.

Moderate to advanced chronic microvascular ischemic changes.

Likely chronically occluded left vertebral artery. Mild proximal
basilar artery stenosis. No hemodynamically significant stenosis at
the ICA origins.

## 2020-01-17 MED ORDER — INSULIN ASPART 100 UNIT/ML ~~LOC~~ SOLN
0.0000 [IU] | Freq: Three times a day (TID) | SUBCUTANEOUS | Status: DC
  Administered 2020-01-18: 1 [IU] via SUBCUTANEOUS
  Administered 2020-01-19 – 2020-01-21 (×5): 2 [IU] via SUBCUTANEOUS
  Administered 2020-01-21: 1 [IU] via SUBCUTANEOUS
  Administered 2020-01-21 – 2020-01-22 (×3): 2 [IU] via SUBCUTANEOUS

## 2020-01-17 MED ORDER — STROKE: EARLY STAGES OF RECOVERY BOOK: Freq: Once | Status: DC

## 2020-01-17 MED ORDER — ACETAMINOPHEN 650 MG RE SUPP: 650.0000 mg | RECTAL | Status: DC | PRN

## 2020-01-17 MED ORDER — ASPIRIN 325 MG PO TABS
325.0000 mg | ORAL_TABLET | Freq: Every day | ORAL | Status: DC
  Administered 2020-01-17 – 2020-01-18 (×2): 325 mg via ORAL
  Filled 2020-01-17 (×2): qty 1

## 2020-01-17 MED ORDER — INSULIN ASPART 100 UNIT/ML ~~LOC~~ SOLN
0.0000 [IU] | Freq: Every day | SUBCUTANEOUS | Status: DC
  Administered 2020-01-21: 3 [IU] via SUBCUTANEOUS

## 2020-01-17 MED ORDER — ASPIRIN 300 MG RE SUPP
300.0000 mg | Freq: Every day | RECTAL | Status: DC
  Filled 2020-01-17: qty 1

## 2020-01-17 MED ORDER — ENOXAPARIN SODIUM 30 MG/0.3ML ~~LOC~~ SOLN
30.0000 mg | SUBCUTANEOUS | Status: DC
  Administered 2020-01-17 – 2020-01-21 (×5): 30 mg via SUBCUTANEOUS
  Filled 2020-01-17 (×5): qty 0.3

## 2020-01-17 MED ORDER — SENNOSIDES-DOCUSATE SODIUM 8.6-50 MG PO TABS: 1.0000 | ORAL_TABLET | Freq: Every evening | ORAL | Status: DC | PRN

## 2020-01-17 MED ORDER — ACETAMINOPHEN 160 MG/5ML PO SOLN: 650.0000 mg | ORAL | Status: DC | PRN

## 2020-01-17 MED ORDER — SODIUM CHLORIDE 0.9 % IV SOLN
1.0000 g | INTRAVENOUS | Status: DC
  Administered 2020-01-17 – 2020-01-18 (×2): 1 g via INTRAVENOUS
  Filled 2020-01-17: qty 10
  Filled 2020-01-17: qty 1
  Filled 2020-01-17: qty 10

## 2020-01-17 MED ORDER — ACETAMINOPHEN 325 MG PO TABS: 650.0000 mg | ORAL_TABLET | ORAL | Status: DC | PRN

## 2020-01-17 MED ORDER — SODIUM CHLORIDE 0.9% FLUSH
3.0000 mL | Freq: Once | INTRAVENOUS | Status: AC
  Administered 2020-01-17: 3 mL via INTRAVENOUS

## 2020-01-17 MED ORDER — LABETALOL HCL 5 MG/ML IV SOLN: 10.0000 mg | INTRAVENOUS | Status: DC | PRN

## 2020-01-17 MED ORDER — SODIUM CHLORIDE 0.9 % IV SOLN: INTRAVENOUS | Status: AC

## 2020-01-17 MED ORDER — SODIUM CHLORIDE 0.9 % IV BOLUS
1000.0000 mL | Freq: Once | INTRAVENOUS | Status: AC
  Administered 2020-01-17: 1000 mL via INTRAVENOUS

## 2020-01-17 NOTE — ED Notes (Signed)
Pt transported to MRI 

## 2020-01-17 NOTE — ED Notes (Signed)
Attempted to call report to 2W, was told RN would have to call me back

## 2020-01-17 NOTE — Consult Note (Signed)
Neurology Consultation  Reason for Consult: Stroke Referring Physician: Dr. Marianna Fuss, EDP  This current note is being dictated under medical record #[id number] she has another chart#[id number]-from which some information was gleaned.  CC: Stroke  History is obtained from: Patient, chart, patient's daughters  HPI: Heather [name] is a 79 y.o. female past medical history of hypertension and diabetes, has not followed up with primary care over the last 1 year and not refill her medications, presented to the emergency room for evaluation of confusion and gait difficulty. She was last normal sometime Tuesday morning, but called her daughter and appeared to make no sense on some conversations. She was referring to her husband not having had return from work, who has been deceased for 10 years. She was taken to [facility] emergency room yesterday by patient's daughter, who is also a CNA, who was on the phone today when I was examining her-she said that she was evaluated, blood tests, urinalysis and a CT head were done and they were told that she has a possibly old stroke in her brain and a new UTI which is the cause of this confusion. She was given IV antibiotics at the [facility] and sent home on p.o. antibiotics. This morning when the other daughter checked on her, she appeared more confused and also appeared weaker on the left side swerving to the left while walking. They brought her to [facility] for evaluation. Her systolic blood pressures were high-in the 200s upon arrival. The patient has not been to see her primary care doctor in over a year. She does have a primary care doctor but she does not want to see that particular physician and chooses not to refill her medications. The daughter said convinced her and made an appointment for this coming Friday with another primary care provider. Patient does not know her last hemoglobin A1c  She has not been  taking her antihypertensive or diabetes medications but does take a baby aspirin every day.   LKW: Sometime in the morning of Tuesday, 01/16/2020 tpa given?: no, outside the window Premorbid modified Rankin scale (mRS): 0  ROS: Unable to reliably obtain due to altered mental status but grossly offers no complaints of fevers, chills, shortness of breath, cough, nausea, vomiting, abdominal pain, bleeding or clotting disorders or prior history of strokes.  Past Medical History:  Diagnosis Date   Hypertension    No family history on file.   Social History:   has no history on file for tobacco use, alcohol use, and drug use. Does not smoke or use illicit drugs. Lives alone, 2 daughters live close by to check on her.  Medications  Current Facility-Administered Medications:    sodium chloride flush (NS) 0.9 % injection 3 mL, 3 mL, Intravenous, Once, Dykstra, [place] Livings, MD No current outpatient medications on file.  Exam: Current vital signs: BP (!) 179/66    Pulse 62    Temp 98.1 F (36.7 C) (Oral)    Resp 16    Ht 5' (1.524 m)    Wt 68 kg    SpO2 98%    BMI 29.29 kg/m  Vital signs in last 24 hours: Temp:  [98.1 F (36.7 C)-98.9 F (37.2 C)] 98.1 F (36.7 C) (09/08 1442) Pulse Rate:  [62-90] 62 (09/08 1610) Resp:  [16-18] 16 (09/08 1610) BP: (164-210)/(66-107) 179/66 (09/08 1610) SpO2:  [97 %-100 %] 98 % (09/08 1610) Weight:  [68 kg] 68 kg (09/08 1611)  GENERAL: Awake, alert in NAD HEENT: - Normocephalic and atraumatic, dry mm, no LN++, no Thyromegally LUNGS - Clear to auscultation bilaterally with no wheezes CV - S1S2 RRR, no m/r/g, equal pulses bilaterally. ABDOMEN - Soft, nontender, nondistended with normoactive BS Ext: warm, well perfused, intact peripheral pulses, no edema NEURO:  Mental Status: She has a rather flat affect but is awake alert oriented x3. Able to tell me where she is, today's date month and year. She was able to name president Language: speech is  nondysarthric.  Naming, repetition, fluency, and comprehension intact. Attention concentration, mildly reduced Cranial Nerves: PERRL. EOMI, visual fields full, slight facial asymmetry-flattening of nasolabial fold on the left, facial sensation intact, hearing intact, tongue/uvula/soft palate midline, normal sternocleidomastoid and trapezius muscle strength. No evidence of tongue atrophy or fibrillations Motor: Very subtle drift in the upper extremity on the left and lower extremity on the left. No drift on the right. Tone: is normal and bulk is normal Sensation- Intact to light touch bilaterally Coordination: FTN intact bilaterally Gait- deferred  NIHSS 1a Level of Conscious.: 0 1b LOC Questions: 0 1c LOC Commands: 0 2 Best Gaze: 0 3 Visual: 0 4 Facial Palsy: 1 5a Motor Arm - left: 1 5b Motor Arm - Right:0  6a Motor Leg - Left: 1 6b Motor Leg - Right: 0 7 Limb Ataxia: 0 8 Sensory: 0 9 Best Language: 0 10 Dysarthria: 0 11 Extinct. and Inatten.: 0 TOTAL: 3    Labs I have reviewed labs in epic and the results pertinent to this consultation are: CBC    Component Value Date/Time   WBC 8.6 01/17/2020 1226   RBC 5.01 01/17/2020 1226   HGB 15.3 (H) 01/17/2020 1232   HCT 45.0 01/17/2020 1232   PLT 288 01/17/2020 1226   MCV 93.8 01/17/2020 1226   MCH 30.5 01/17/2020 1226   MCHC 32.6 01/17/2020 1226   RDW 13.5 01/17/2020 1226   LYMPHSABS 1.4 01/17/2020 1226   MONOABS 0.6 01/17/2020 1226   EOSABS 0.0 01/17/2020 1226   BASOSABS 0.0 01/17/2020 1226    CMP     Component Value Date/Time   NA 143 01/17/2020 1232   K 3.7 01/17/2020 1232   CL 105 01/17/2020 1232   CO2 26 01/17/2020 1226   GLUCOSE 142 (H) 01/17/2020 1232   BUN 22 01/17/2020 1232   CREATININE 1.60 (H) 01/17/2020 1232   CALCIUM 9.6 01/17/2020 1226   PROT 6.8 01/17/2020 1226   ALBUMIN 3.7 01/17/2020 1226   AST 25 01/17/2020 1226   ALT 17 01/17/2020 1226   ALKPHOS 91 01/17/2020 1226   BILITOT 0.7 01/17/2020  1226   GFRNONAA 30 (L) 01/17/2020 1226   GFRAA 35 (L) 01/17/2020 1226   Imaging I have reviewed the images obtained:  CT-scan of the brain likely subacute right basal ganglia infarct   Similar hypodensity was also seen on the CT head from 01/16/2020 but ascertained to be age-indeterminate-acute to subacute infarct cannot be ruled out.  Assessment:  79 year old woman with diabetes and hypertension, noncompliant to medications, presented to the emergency room for evaluation of confusion and now left-sided weakness with last known normal greater than 24 hours ago, noted to have a subacute evolving infarct in the right basal ganglia on the head CT. Stroke likely small vessel etiology secondary to noncompliance to medications and hypertensive urgency present on admission.  Impression: Acute ischemic stroke-likely small vessel etiology Hypertensive urgency Noncompliance medications Hypertension Diabetes  Recommendations: -Admit to hospitalist -Telemetry -Frequent neurochecks -2D  echocardiogram -A1c -Lipid panel -PT -OT -Speech therapy -Allow for permissive hypertension-for another 24-48 hours hours or so and then start normalizing blood pressures to goal at discharge of systolic less than 140 and diastolic less than 90. -N.p.o. until cleared by bedside swallow evaluation or formal swallow evaluation. -Counseled on the importance of compliance with medications and regular medical follow-up including follow-up for chronic conditions which are high on the list of stroke risk factors-diabetes and hypertension. We will also screen her for hypercholesterolemia-lipid panel as above. -Aspirin 325 p.o. now daily -Atorvastatin 80 p.o. now and daily -MRI brain without contrast. MRA head without contrast. MRA neck without contrast. She has some documented allergy to iohexol-family unclear but I think we can get by with the MRAs. -May need to be set up with a reliable outpatient internist  follow-up.  Stroke team will follow with you.  -- Milon Dikes, MD [facility] Pager: [phone] If 7pm to 7am, please call on call as listed on AMION.

## 2020-01-17 NOTE — ED Triage Notes (Addendum)
Patient arrived by [facility] from home after being found altered on toliet after Fire department had to break into home after daughter couldn't get in. Patient supposedly diagnosed with UTI yesterday and states not taking any daily meds-been out for weeks/ months. Patient with no neuro deficits EMS reports very altered on their arrival and BP 200s

## 2020-01-17 NOTE — ED Provider Notes (Signed)
[facility] EMERGENCY DEPARTMENT Provider Note   CSN: [id number] Arrival date & time: 01/17/20  1208     History No chief complaint on file.   Heather [name] is a 79 y.o. female.  Presented to the emergency room with concern for altered mental status.  Daughter reports that her sister noted patient had a little bit of a hard time walking on Friday, seemed okay Saturday.  No one was with patient on Sunday or Monday.  On Tuesday morning, daughter spoke with her mom over the phone and she seemed more confused.  She was brought to the emergency department, diagnosed with UTI and discharged home.  Daughter reports that she has noticed her mom has been favoring her right side and not using her left side as much.  Her mom continues to have increased confusion.  Daughter reports patient is normally very active, independent.  Past medical history hypertension.  No prior history of stroke.  HPI     Past Medical History:  Diagnosis Date  . Hypertension     There are no problems to display for this patient.   History reviewed. No pertinent surgical history.   OB History   No obstetric history on file.     No family history on file.  Social History   Tobacco Use  . Smoking status: Not on file  Substance Use Topics  . Alcohol use: Not on file  . Drug use: Not on file    Home Medications Prior to Admission medications   Not on File    Allergies    Patient has no known allergies.  Review of Systems   Review of Systems  Unable to perform ROS: Mental status change    Physical Exam Updated Vital Signs BP (!) 179/66   Pulse 62   Temp 98.1 F (36.7 C) (Oral)   Resp 16   Ht 5' (1.524 m)   Wt 68 kg   SpO2 98%   BMI 29.29 kg/m   Physical Exam Vitals and nursing note reviewed.  Constitutional:      General: She is not in acute distress.    Appearance: She is well-developed.  HENT:     Head: Normocephalic and atraumatic.  Eyes:      Conjunctiva/sclera: Conjunctivae normal.  Cardiovascular:     Rate and Rhythm: Normal rate and regular rhythm.     Heart sounds: No murmur heard.   Pulmonary:     Effort: Pulmonary effort is normal. No respiratory distress.     Breath sounds: Normal breath sounds.  Abdominal:     Palpations: Abdomen is soft.     Tenderness: There is no abdominal tenderness.  Musculoskeletal:     Cervical back: Neck supple.  Skin:    General: Skin is warm and dry.  Neurological:     Mental Status: She is alert.     Comments: Alert, awake, oriented to person, answers some questions appropriately but demonstrates some confusion CN 2-12 intact, speech clear visual fields intact 5/5 strength in b/l UE and LE Sensation to light touch intact in b/l UE and LE Normal FNF Gait not tested     ED Results / Procedures / Treatments   Labs (all labs ordered are listed, but only abnormal results are displayed) Labs Reviewed  CBC - Abnormal; Notable for the following components:      Result Value   Hemoglobin 15.3 (*)    HCT 47.0 (*)    All other components within  normal limits  COMPREHENSIVE METABOLIC PANEL - Abnormal; Notable for the following components:   Glucose, Bld 148 (*)    Creatinine, Ser 1.62 (*)    GFR calc non Af Amer 30 (*)    GFR calc Af Amer 35 (*)    All other components within normal limits  I-STAT CHEM 8, ED - Abnormal; Notable for the following components:   Creatinine, Ser 1.60 (*)    Glucose, Bld 142 (*)    Hemoglobin 15.3 (*)    All other components within normal limits  SARS CORONAVIRUS 2 BY RT PCR (HOSPITAL ORDER, PERFORMED IN [facility])  PROTIME-INR  APTT  DIFFERENTIAL  CBG MONITORING, ED    EKG EKG Interpretation  Date/Time:  Wednesday January 17 2020 12:17:29 EDT Ventricular Rate:  76 PR Interval:  198 QRS Duration: 138 QT Interval:  412 QTC Calculation: 463 R Axis:   76 Text Interpretation: Normal sinus rhythm Right bundle branch block T wave  abnormality, consider inferolateral ischemia Abnormal ECG Confirmed by Marianna Fuss ([id number]) on 01/17/2020 4:20:55 PM   Radiology CT HEAD WO CONTRAST  Result Date: 01/17/2020 CLINICAL DATA:  Stroke, follow-up EXAM: CT HEAD WITHOUT CONTRAST TECHNIQUE: Contiguous axial images were obtained from the base of the skull through the vertex without intravenous contrast. COMPARISON:  01/16/2020 FINDINGS: Brain: There is no acute intracranial hemorrhage or mass effect. Hypoattenuation is again identified at the right caudate and lentiform nucleus. No new loss of gray-white differentiation. Additional patchy and confluent areas of hypoattenuation in the supratentorial white matter are nonspecific but probably reflect stable chronic microvascular ischemic changes. Ventricles are stable in caliber. Vascular: There is atherosclerotic calcification at the skull base. Skull: Calvarium is unremarkable. Sinuses/Orbits: No acute finding. Other: None. IMPRESSION: Evolving acute to subacute right basal ganglia infarct. No acute intracranial hemorrhage. Electronically Signed   By: Guadlupe Spanish M.D.   On: 01/17/2020 14:12    Procedures .Critical Care Performed by: Milagros Loll, MD Authorized by: Milagros Loll, MD   Critical care provider statement:    Critical care time (minutes):  34   Critical care was necessary to treat or prevent imminent or life-threatening deterioration of the following conditions:  CNS failure or compromise   Critical care was time spent personally by me on the following activities:  Discussions with consultants, evaluation of patient's response to treatment, examination of patient, ordering and performing treatments and interventions, ordering and review of laboratory studies, ordering and review of radiographic studies, pulse oximetry, re-evaluation of patient's condition, obtaining history from patient or surrogate and review of old charts   (including critical care  time)  Medications Ordered in ED Medications  sodium chloride flush (NS) 0.9 % injection 3 mL (has no administration in time range)    ED Course  I have reviewed the triage vital signs and the nursing notes.  Pertinent labs & imaging results that were available during my care of the patient were reviewed by me and considered in my medical decision making (see chart for details).    MDM Rules/Calculators/A&P                          79 year old lady presents to ER with concern for increased confusion, mild left-sided neglect.  CT scan concerning for evolving acute to subacute right basal ganglia stroke.  Reviewed case with Dr. Jerrell Belfast with neurology.  Based on symptomatology and CT findings, he has a very high suspicion that this  is in fact an acute or subacute stroke.  He recommended admitting to medicine and obtaining MRI to evaluate for extent of stroke.  Final Clinical Impression(s) / ED Diagnoses Final diagnoses:  Cerebrovascular accident (CVA), unspecified mechanism (HCC)    Rx / DC Orders ED Discharge Orders    None       Milagros Loll, MD 01/17/20 1659

## 2020-01-17 NOTE — ED Notes (Signed)
Daughter, [name], is asking for MD to call her or other sister, Amy, w MRI results since pt is altered and won't understand- sent message to MD to ask him to call w results whenever he has time

## 2020-01-17 NOTE — H&P (Signed)
History and Physical    Heather DodrillRuth E [name] ZOX:[id number] DOB: 01/23/1941 DOA: 01/17/2020  PCP: Patient, No Pcp Per  Patient coming from: home  I have personally briefly reviewed patient[facility]  Chief Complaint: confusion  HPI: Heather [name] is Heather [name] 79 y.o. female with medical history significant of HTN, DM, HLD presenting with confusion.  Hx obtained from daughter due to the pt's mental status.  Last normal Thursday or Friday.  Heather [name] little wobbly, but not far from normal.  At baseline lives independently, has dog that she walks.  No cane or walker.  Does not drive, but cooks and cleans.  Daughters notices progressive confusion over the weekend.  Today, she said "dad didn't come home", but her husband passed away 10 years ago.  Seems to have Heather [name] flat affect, not normal emotions she usually has.  No fevers, chills, CP, SOB, abdominal pain.  She's not on many meds, but she d/c'd these after she stopped seeing her PCP.  No smoking or etoh.  She's vaccinated against COVID, received both doses of pfizer, last dose in April.   ED Course: Imaging, labs, neurology c/s.  Hospitalist to admit for CVA.  Review of Systems: As per HPI otherwise all other systems reviewed and are negative.  Past Medical History:  Diagnosis Date  . Hypertension     History reviewed. No pertinent surgical history.  Social History  has no history on file for tobacco use, alcohol use, and drug use.  Allergies  Allergen Reactions  . Iohexol Other (See Comments)    Reaction not recalled    No family history on file.  Prior to Admission medications   Medication Sig Start Date End Date Taking? Authorizing Provider  aspirin EC 325 MG tablet Take 325 mg by mouth at bedtime.   Yes [provider]  cephALEXin (KEFLEX) 500 MG capsule Take 500 mg by mouth 4 (four) times daily. FOR 7 DAYS    [provider]    Physical Exam: Vitals:   01/17/20 1610 01/17/20 1611 01/17/20 1730  01/17/20 2044  BP: (!) 179/66  (!) 197/78 (!) 205/75  Pulse: 62  66 80  Resp: 16  18 13   Temp:      TempSrc:      SpO2: 98%  99% 99%  Weight:  68 kg    Height:  5' (1.524 m)      Constitutional: NAD, calm, comfortable Vitals:   01/17/20 1610 01/17/20 1611 01/17/20 1730 01/17/20 2044  BP: (!) 179/66  (!) 197/78 (!) 205/75  Pulse: 62  66 80  Resp: 16  18 13   Temp:      TempSrc:      SpO2: 98%  99% 99%  Weight:  68 kg    Height:  5' (1.524 m)     Eyes: PERRL, lids and conjunctivae normal ENMT: Mucous membranes are moist. Posterior pharynx clear of any exudate or lesions.Normal dentition.  Neck: normal, supple, no masses, no thyromegaly Respiratory: clear to auscultation bilaterally, no wheezing, no crackles. Normal respiratory effort. No accessory muscle use.  Cardiovascular: Regular rate and rhythm, no murmurs / rubs / gallops. No extremity edema. 2+ pedal pulses. No carotid bruits.  Abdomen: no tenderness, no masses palpated. No hepatosplenomegaly. Bowel sounds positive.  Musculoskeletal: no clubbing / cyanosis. No joint deformity upper and lower extremities. Good ROM, no contractures. Normal muscle tone.  Skin: no rashes, lesions, ulcers. No induration Neurologic: mild L facial droop, confused.  Symmetric strength.   Psychiatric: Normal judgment and insight. Alert and oriented x 3. Normal mood.   Labs on Admission: I have personally reviewed following labs and imaging studies  CBC: Recent Labs  Lab 01/17/20 1226 01/17/20 1232 01/17/20 1955  WBC 8.6  --  7.0  NEUTROABS 6.5  --   --   HGB 15.3* 15.3* 13.7  HCT 47.0* 45.0 41.9  MCV 93.8  --  93.1  PLT 288  --  232    Basic Metabolic Panel: Recent Labs  Lab 01/17/20 1226 01/17/20 1232 01/17/20 1955  NA 141 143  --   K 3.6 3.7  --   CL 104 105  --   CO2 26  --   --   GLUCOSE 148* 142*  --   BUN 20 22  --   CREATININE 1.62* 1.60* 1.42*  CALCIUM 9.6  --   --     GFR: Estimated Creatinine Clearance: 28.1  mL/min (Heather [name]) (by C-G formula based on SCr of 1.42 mg/dL (H)).  Liver Function Tests: Recent Labs  Lab 01/17/20 1226  AST 25  ALT 17  ALKPHOS 91  BILITOT 0.7  PROT 6.8  ALBUMIN 3.7    Urine analysis: No results found for: COLORURINE, APPEARANCEUR, LABSPEC, PHURINE, GLUCOSEU, HGBUR, BILIRUBINUR, KETONESUR, PROTEINUR, UROBILINOGEN, NITRITE, LEUKOCYTESUR  Radiological Exams on Admission: CT HEAD WO CONTRAST  Result Date: 01/17/2020 CLINICAL DATA:  Stroke, follow-up EXAM: CT HEAD WITHOUT CONTRAST TECHNIQUE: Contiguous axial images were obtained from the base of the skull through the vertex without intravenous contrast. COMPARISON:  01/16/2020 FINDINGS: Brain: There is no acute intracranial hemorrhage or mass effect. Hypoattenuation is again identified at the right caudate and lentiform nucleus. No new loss of gray-white differentiation. Additional patchy and confluent areas of hypoattenuation in the supratentorial white matter are nonspecific but probably reflect stable chronic microvascular ischemic changes. Ventricles are stable in caliber. Vascular: There is atherosclerotic calcification at the skull base. Skull: Calvarium is unremarkable. Sinuses/Orbits: No acute finding. Other: None. IMPRESSION: Evolving acute to subacute right basal ganglia infarct. No acute intracranial hemorrhage. Electronically Signed   By: Heather [name] M.D.   On: 01/17/2020 14:12   MR ANGIO HEAD WO CONTRAST  Result Date: 01/17/2020 CLINICAL DATA:  Confusion and gait difficulty EXAM: MRI HEAD WITHOUT CONTRAST MRA HEAD WITHOUT CONTRAST MRA NECK WITHOUT CONTRAST TECHNIQUE: Multiplanar, multiecho pulse sequences of the brain and surrounding structures were obtained without intravenous contrast. Angiographic images of the Circle of Willis were obtained using MRA technique without intravenous contrast. Angiographic images of the neck were obtained using MRA technique without intravenous contrast. Carotid stenosis measurements  (when applicable) are obtained utilizing NASCET criteria, using the distal internal carotid diameter as the denominator. COMPARISON:  Recent CT imaging FINDINGS: MRI HEAD Brain: There is reduced diffusion involving the right caudate and lentiform nuclei and adjacent white matter as seen on CT. Additionally, there are small foci of reduced diffusion contralaterally involving the internal capsule and lentiform nucleus. No evidence of intracranial hemorrhage. No significant mass effect. Additional patchy and confluent areas of T2 hyperintensity in the supratentorial and pontine white matter are nonspecific but probably reflect moderate to advanced chronic microvascular ischemic changes. There is no hydrocephalus. No extra-axial fluid collection. No intracranial mass. Vascular: Loss of the expected flow void of the visualized left vertebral artery. Major vessel flow voids at the skull base are otherwise preserved. Skull and upper cervical spine: Normal marrow signal is preserved. Sinuses/Orbits: Paranasal sinuses are aerated. Orbits are unremarkable. Other:  Sella is unremarkable.  Mastoid air cells are clear. MRA HEAD Intracranial internal carotid arteries are patent with atherosclerotic irregularity. Middle and anterior cerebral arteries are patent. Right A1 ACA is dominant. Left A1 ACA is hypoplastic or absent. Intracranial right vertebral artery is patent. There is no flow related enhancement of the intracranial left vertebral artery. There is Sanel Stemmer patent left AICA/PICA. Basilar artery is patent with mild stenosis proximally. Posterior cerebral arteries are patent. Bilateral posterior communicating arteries are present and are the predominant supply of the posterior cerebral arteries. MRA NECK Common, internal, and external carotid arteries are patent. There is atherosclerotic plaque at the ICA origins with minimal stenosis. Extracranial right vertebral artery is patent. There is no flow related enhancement of the left  vertebral artery. 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 Additional small acute infarcts of the left internal capsule and lentiform nucleus. 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 No hemodynamically significant stenosis at the ICA origins. Electronically Signed   By: Heather [name] M.D.   On: 01/17/2020 20:09   MR ANGIO NECK WO CONTRAST  Result Date: 01/17/2020 CLINICAL DATA:  Confusion and gait difficulty EXAM: MRI HEAD WITHOUT CONTRAST MRA HEAD WITHOUT CONTRAST MRA NECK WITHOUT CONTRAST TECHNIQUE: Multiplanar, multiecho pulse sequences of the brain and surrounding structures were obtained without intravenous contrast. Angiographic images of the Circle of Willis were obtained using MRA technique without intravenous contrast. Angiographic images of the neck were obtained using MRA technique without intravenous contrast. Carotid stenosis measurements (when applicable) are obtained utilizing NASCET criteria, using the distal internal carotid diameter as the denominator. COMPARISON:  Recent CT imaging FINDINGS: MRI HEAD Brain: There is reduced diffusion involving the right caudate and lentiform nuclei and adjacent white matter as seen on CT. Additionally, there are small foci of reduced diffusion contralaterally involving the internal capsule and lentiform nucleus. No evidence of intracranial hemorrhage. No significant mass effect. Additional patchy and confluent areas of T2 hyperintensity in the supratentorial and pontine white matter are nonspecific but probably reflect moderate to advanced chronic microvascular ischemic changes. There is no hydrocephalus. No extra-axial fluid collection. No intracranial mass. Vascular: Loss of the expected flow void of the visualized left vertebral artery. Major vessel flow voids at the skull base are otherwise preserved. Skull  and upper cervical spine: Normal marrow signal is preserved. Sinuses/Orbits: Paranasal sinuses are aerated. Orbits are unremarkable. Other: Sella is unremarkable.  Mastoid air cells are clear. MRA HEAD Intracranial internal carotid arteries are patent with atherosclerotic irregularity. Middle and anterior cerebral arteries are patent. Right A1 ACA is dominant. Left A1 ACA is hypoplastic or absent. Intracranial right vertebral artery is patent. There is no flow related enhancement of the intracranial left vertebral artery. There is Alecea Trego patent left AICA/PICA. Basilar artery is patent with mild stenosis proximally. Posterior cerebral arteries are patent. Bilateral posterior communicating arteries are present and are the predominant supply of the posterior cerebral arteries. MRA NECK Common, internal, and external carotid arteries are patent. There is atherosclerotic plaque at the ICA origins with minimal stenosis. Extracranial right vertebral artery is patent. There is no flow related enhancement of the left vertebral artery. 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 Additional small acute infarcts of the left internal capsule and lentiform nucleus. 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 No hemodynamically significant stenosis  at the ICA origins. Electronically Signed   By: Heather [name] M.D.   On: 01/17/2020 20:09   MR BRAIN WO CONTRAST  Result Date: 01/17/2020 CLINICAL DATA:  Confusion and gait difficulty EXAM: MRI HEAD WITHOUT CONTRAST MRA HEAD WITHOUT CONTRAST MRA NECK WITHOUT CONTRAST TECHNIQUE: Multiplanar, multiecho pulse sequences of the brain and surrounding structures were obtained without intravenous contrast. Angiographic images of the Circle of Willis were obtained using MRA technique without intravenous contrast. Angiographic images of the neck were  obtained using MRA technique without intravenous contrast. Carotid stenosis measurements (when applicable) are obtained utilizing NASCET criteria, using the distal internal carotid diameter as the denominator. COMPARISON:  Recent CT imaging FINDINGS: MRI HEAD Brain: There is reduced diffusion involving the right caudate and lentiform nuclei and adjacent white matter as seen on CT. Additionally, there are small foci of reduced diffusion contralaterally involving the internal capsule and lentiform nucleus. No evidence of intracranial hemorrhage. No significant mass effect. Additional patchy and confluent areas of T2 hyperintensity in the supratentorial and pontine white matter are nonspecific but probably reflect moderate to advanced chronic microvascular ischemic changes. There is no hydrocephalus. No extra-axial fluid collection. No intracranial mass. Vascular: Loss of the expected flow void of the visualized left vertebral artery. Major vessel flow voids at the skull base are otherwise preserved. Skull and upper cervical spine: Normal marrow signal is preserved. Sinuses/Orbits: Paranasal sinuses are aerated. Orbits are unremarkable. Other: Sella is unremarkable.  Mastoid air cells are clear. MRA HEAD Intracranial internal carotid arteries are patent with atherosclerotic irregularity. Middle and anterior cerebral arteries are patent. Right A1 ACA is dominant. Left A1 ACA is hypoplastic or absent. Intracranial right vertebral artery is patent. There is no flow related enhancement of the intracranial left vertebral artery. There is Shenica Holzheimer patent left AICA/PICA. Basilar artery is patent with mild stenosis proximally. Posterior cerebral arteries are patent. Bilateral posterior communicating arteries are present and are the predominant supply of the posterior cerebral arteries. MRA NECK Common, internal, and external carotid arteries are patent. There is atherosclerotic plaque at the ICA origins with minimal stenosis.  Extracranial right vertebral artery is patent. There is no flow related enhancement of the left vertebral artery. 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 Additional small acute infarcts of the left internal capsule and lentiform nucleus. 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 No hemodynamically significant stenosis at the ICA origins. Electronically Signed   By: Heather [name] M.D.   On: 01/17/2020 20:09    EKG: Independently reviewed. Sinus, RBBB  Assessment/Plan Active Problems:   Stroke [facility])   Acute Stroke MRI/MRA head/neck showing Acute Infarcts of the R Basal Ganglia and Adjacent White Matter as well as Small Infarcts of L internal Capsule and lentiform nucleus.  Chronically occluded L vertebral artery.  Mild proximal basilar artery stenosis.  No hemodynamically significant stenosis at ICA origins. Neurology c/s, appreciate recommendations A1c, lipid panel PT/OT/SLP Echo Permissive hypertension for 24-48 hrs, then gradually normalize BP NPO until bedside swallow Start ASA daily.  She notes statin intolerance, will revisit tomorrow.  Acute Metabolic Encephalopathy: likely 2/2 stroke above Delirium precautions TSH, b12, ammonia Follow  Hypertension: significantly elevated, permissive hypertension in setting of stroke.  Labetalol prn for SBP >220 or DBP >120.    T2DM: not on any home meds, will start SSI and follow bg's  HLD: she's not on any home meds, follow LDL  Concern  for UTI: continue ceftriaxone, follow urine culture  She needs PCP, TOC consult  Chart pending merge DVT prophylaxis: lovenox  Code Status:   full  Family Communication:  Daughter at bedside  Disposition Plan:   Patient is from:  home  Anticipated DC to:  pending  Anticipated DC date:  9/10  Anticipated DC barriers: Stroke, need for rehab  Consults  called:  neurology  Admission status:  inpatient   Severity of Illness: The appropriate patient status for this patient is INPATIENT. Inpatient status is judged to be reasonable and necessary in order to provide the required intensity of service to ensure the patient's safety. The patient's presenting symptoms, physical exam findings, and initial radiographic and laboratory data in the context of their chronic comorbidities is felt to place them at high risk for further clinical deterioration. Furthermore, it is not anticipated that the patient will be medically stable for discharge from the hospital within 2 midnights of admission. The following factors support the patient status of inpatient.   " The patient's presenting symptoms include acute metabolic encephalopathy. " The worrisome physical exam findings include confusion. " The initial radiographic and laboratory data are worrisome because of stroke. " The chronic co-morbidities include HTN, DM, HLD.   * I certify that at the point of admission it is my clinical judgment that the patient will require inpatient hospital care spanning beyond 2 midnights from the point of admission due to high intensity of service, high risk for further deterioration and high frequency of surveillance required.Lacretia Nicks MD [facility]  How to contact the [facility] Attending or Consulting provider 7A - 7P or covering provider during after hours 7P -7A, for this patient?   1. Check the care team in [facility] and look for Brelee Renk) attending/consulting [facility] provider listed and b) the [facility] team listed 2. Log into [email or web address] and use [facility] universal password to access. If you do not have the password, please contact the hospital operator. 3. Locate the [facility] provider you are looking for under [facility] and page to Waleed Dettman number that you can be directly reached. 4. If you still have difficulty reaching the provider, please page the [facility] (Director on  Call) for the Hospitalists listed on amion for assistance.  01/17/2020, 9:40 PM

## 2020-01-18 ENCOUNTER — Other Ambulatory Visit: Payer: Self-pay | Admitting: Cardiology

## 2020-01-18 ENCOUNTER — Inpatient Hospital Stay (HOSPITAL_COMMUNITY): Payer: Medicare Other

## 2020-01-18 DIAGNOSIS — I639 Cerebral infarction, unspecified: Secondary | ICD-10-CM

## 2020-01-18 DIAGNOSIS — I6389 Other cerebral infarction: Secondary | ICD-10-CM

## 2020-01-18 LAB — COMPREHENSIVE METABOLIC PANEL
ALT: 16 U/L (ref 0–44)
AST: 25 U/L (ref 15–41)
Albumin: 3 g/dL — ABNORMAL LOW (ref 3.5–5.0)
Alkaline Phosphatase: 75 U/L (ref 38–126)
Anion gap: 12 (ref 5–15)
BUN: 17 mg/dL (ref 8–23)
CO2: 23 mmol/L (ref 22–32)
Calcium: 8.8 mg/dL — ABNORMAL LOW (ref 8.9–10.3)
Chloride: 108 mmol/L (ref 98–111)
Creatinine, Ser: 1.31 mg/dL — ABNORMAL HIGH (ref 0.44–1.00)
GFR calc Af Amer: 45 mL/min — ABNORMAL LOW (ref >60)
GFR calc non Af Amer: 39 mL/min — ABNORMAL LOW (ref >60)
Glucose, Bld: 114 mg/dL — ABNORMAL HIGH (ref 70–99)
Potassium: 3 mmol/L — ABNORMAL LOW (ref 3.5–5.1)
Sodium: 143 mmol/L (ref 135–145)
Total Bilirubin: 0.7 mg/dL (ref 0.3–1.2)
Total Protein: 5.9 g/dL — ABNORMAL LOW (ref 6.5–8.1)

## 2020-01-18 LAB — CBC WITH DIFFERENTIAL/PLATELET
Abs Immature Granulocytes: 0.02 10*3/uL (ref 0.00–0.07)
Basophils Absolute: 0 10*3/uL (ref 0.0–0.1)
Basophils Relative: 1 %
Eosinophils Absolute: 0.1 10*3/uL (ref 0.0–0.5)
Eosinophils Relative: 2 %
HCT: 39.7 % (ref 36.0–46.0)
Hemoglobin: 13 g/dL (ref 12.0–15.0)
Immature Granulocytes: 0 %
Lymphocytes Relative: 23 %
Lymphs Abs: 1.5 10*3/uL (ref 0.7–4.0)
MCH: 30.1 pg (ref 26.0–34.0)
MCHC: 32.7 g/dL (ref 30.0–36.0)
MCV: 91.9 fL (ref 80.0–100.0)
Monocytes Absolute: 0.6 10*3/uL (ref 0.1–1.0)
Monocytes Relative: 10 %
Neutro Abs: 4.1 10*3/uL (ref 1.7–7.7)
Neutrophils Relative %: 64 %
Platelets: 226 10*3/uL (ref 150–400)
RBC: 4.32 MIL/uL (ref 3.87–5.11)
RDW: 13.6 % (ref 11.5–15.5)
WBC: 6.4 10*3/uL (ref 4.0–10.5)
nRBC: 0 % (ref 0.0–0.2)

## 2020-01-18 LAB — GLUCOSE, CAPILLARY
Glucose-Capillary: 108 mg/dL — ABNORMAL HIGH (ref 70–99)
Glucose-Capillary: 133 mg/dL — ABNORMAL HIGH (ref 70–99)
Glucose-Capillary: 76 mg/dL (ref 70–99)
Glucose-Capillary: 93 mg/dL (ref 70–99)
Glucose-Capillary: 98 mg/dL (ref 70–99)

## 2020-01-18 LAB — ECHOCARDIOGRAM COMPLETE
Area-P 1/2: 2.73 cm2
Height: 60 in
S' Lateral: 2.5 cm
Weight: 2222.24 oz

## 2020-01-18 LAB — URINE CULTURE: Culture: >=100000 — AB

## 2020-01-18 LAB — LIPID PANEL
Cholesterol: 298 mg/dL — ABNORMAL HIGH (ref 0–200)
HDL: 37 mg/dL — ABNORMAL LOW (ref >40)
LDL Cholesterol: 239 mg/dL — ABNORMAL HIGH (ref 0–99)
Total CHOL/HDL Ratio: 8.1 RATIO
Triglycerides: 112 mg/dL (ref <150)
VLDL: 22 mg/dL (ref 0–40)

## 2020-01-18 LAB — PHOSPHORUS: Phosphorus: 3.7 mg/dL (ref 2.5–4.6)

## 2020-01-18 LAB — MAGNESIUM: Magnesium: 1.9 mg/dL (ref 1.7–2.4)

## 2020-01-18 MED ORDER — LABETALOL HCL 5 MG/ML IV SOLN
10.0000 mg | INTRAVENOUS | Status: DC | PRN
  Administered 2020-01-18: 10 mg via INTRAVENOUS
  Filled 2020-01-18 (×2): qty 4

## 2020-01-18 MED ORDER — ROSUVASTATIN CALCIUM 20 MG PO TABS
40.0000 mg | ORAL_TABLET | Freq: Every day | ORAL | Status: DC
  Administered 2020-01-18 – 2020-01-22 (×5): 40 mg via ORAL
  Filled 2020-01-18 (×6): qty 2

## 2020-01-18 MED ORDER — POTASSIUM CHLORIDE CRYS ER 20 MEQ PO TBCR
40.0000 meq | EXTENDED_RELEASE_TABLET | ORAL | Status: AC
  Administered 2020-01-18 (×2): 40 meq via ORAL
  Filled 2020-01-18 (×2): qty 2

## 2020-01-18 MED ORDER — AMLODIPINE BESYLATE 5 MG PO TABS
5.0000 mg | ORAL_TABLET | Freq: Every day | ORAL | Status: DC
  Administered 2020-01-18 – 2020-01-19 (×2): 5 mg via ORAL
  Filled 2020-01-18 (×2): qty 1

## 2020-01-18 MED ORDER — POLYETHYLENE GLYCOL 3350 17 G PO PACK
17.0000 g | PACK | Freq: Every day | ORAL | Status: DC
  Administered 2020-01-20 – 2020-01-22 (×3): 17 g via ORAL
  Filled 2020-01-18 (×4): qty 1

## 2020-01-18 MED ORDER — ASPIRIN EC 81 MG PO TBEC
81.0000 mg | DELAYED_RELEASE_TABLET | Freq: Every day | ORAL | Status: DC
  Administered 2020-01-19 – 2020-01-22 (×4): 81 mg via ORAL
  Filled 2020-01-18 (×4): qty 1

## 2020-01-18 MED ORDER — CLOPIDOGREL BISULFATE 75 MG PO TABS
75.0000 mg | ORAL_TABLET | Freq: Every day | ORAL | Status: DC
  Administered 2020-01-18 – 2020-01-22 (×5): 75 mg via ORAL
  Filled 2020-01-18 (×5): qty 1

## 2020-01-18 NOTE — Progress Notes (Addendum)
STROKE TEAM PROGRESS NOTE    Interval History   No acute events overnight, patient is currently resting in bed with her daughter at bedside. Per her daughter, though the patient is able to answer LOC questions appropriately, she has noted her to be confused at home. Stroke work up and stroke prevention were reviewed with the patient and her daughter.   Pertinent Lab Work and Imaging    01/17/20 CT Head WO IV Contrast Evolving acute to subacute right basal ganglia infarct. No acute intracranial hemorrhage.  01/17/20 MR Angio Head and Neck WO Contrast MRA HEAD Intracranial internal carotid arteries are patent with atherosclerotic irregularity. Middle and anterior cerebral arteries are patent. Right A1 ACA is dominant. Left A1 ACA is hypoplastic or absent.  Intracranial right vertebral artery is patent. There is no flow related enhancement of the intracranial left vertebral artery. There is a patent left AICA/PICA. Basilar artery is patent with mild stenosis proximally. Posterior cerebral arteries are patent. Bilateral posterior communicating arteries are present and are the predominant supply of the posterior cerebral arteries.  MRA NECK Common, internal, and external carotid arteries are patent. There is atherosclerotic plaque at the ICA origins with minimal stenosis. Extracranial right vertebral artery is patent. There is no flow related enhancement of the left vertebral artery. 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   01/17/20 MRI Brain WO IV Contrast Acute infarcts of the right basal ganglia and adjacent white matter. Additional small acute infarcts of the left internal capsule and lentiform nucleus. 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  01/18/20 Echocardiogram Complete  1. Left ventricular ejection fraction, by estimation, is 65 to 70%. The left ventricle has normal function. The left ventricle has no regional wall motion abnormalities. Left ventricular  diastolic parameters are  indeterminate.  2. Right ventricular systolic function is normal. The right ventricular size is normal. There is normal pulmonary artery systolic pressure.  3. The mitral valve is normal in structure. Trivial mitral valve regurgitation. No evidence of mitral stenosis.  4. The aortic valve is tricuspid. Aortic valve regurgitation is not visualized. No aortic stenosis is present.   Physical Examination   Constitutional: Calm, appropriate for condition  Cardiovascular: Normal RR Respiratory: No increased WOB   Mental status: Oriented to person, place, time, location not to situation  Speech: Fluent with repetition and naming intact Cranial nerves: EOMI, VFF, L Facial Droop, Sensation intact V1V2V3, tongue midline, shoulder shrug intact  Motor: Normal bulk and tone. No drift.   Dlt Bic Tri FgS Grp HF  KnF KnE PIF DoF  R 5 5 5 5 5 5 5 5 5 5   L +4 +4 +4 4 5 5 5 5 5 5   Sensory: Intact to light tough throughout  Coordination: Intact FNF + HTS Reflexes: Deferred Gait: Deferred  NIHSS: 1 for left facial droop   Assessment and Plan   [name] is a 79 y.o. female w/pmh of HTN, HLD, diabetes, medical non compliance who presents with confusion and gait difficulty. Her gait difficulty started on 01/12/20 and confusion started on 01/16/20. She was taken to an OSH on Tuesday where she was diagnosed with a UTI + CTH was done that revealed and old stroke in her brain. She was sent home on oral abx and presented to [facility] on 01/17/20 due to continued confusion and gait difficulty noted to be swerving to the left side while walking. She was found to have an acute right basal ganglia stroke. She was outside  the time window for IVTPA and Thrombectomy.   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 At this time, her stroke work up is complete. Her MR Angio  Head and Neck was significant for a chronically occluded left vertebral artery, minimal stenosis at the ICA origins and patent intracranial ICAs with atherosclerotic irregularity. No significant stenosis. Her echocardiogram was non revealing for cardiac source of stroke, EF was WNL and her left atrium was normal in size. Stroke labs were notable for LDL of 239, hemoglobin A1C 6.4 Her stroke risk factors include HTN, HLD and Diabetes. More than likely her stroke is in the setting of SVD due to her uncontrolled HTN and HLD. However, given the bilateral nature of her stroke, are recommending to obtain a cardiac event monitor at discharge to rule out atrial fibrillation. Per her daughter the patient stopped taking all of her medications because she did not like her PCP whom she saw in the past. Per chart review there is no fill history of medications. This admission she was started on DAPT + Rosuvastatin for secondary stroke prevention.  -DAPT for 21 days followed by Aspirin monotherapy  -Continue Rosuvastatin 40 mg for secondary stroke prevention  -Cardiac event monitor at discharge to evaluate for atrial fibrillation  -At discharge please place ambulatory referral to neurology for stroke follow up   #Hypertension She has a history of HTN and was not taking any anti htns at home as she stopped taking her medications. Currently, her SBP is trending in the 160-180 range with an isolated reading at 205/75. Permissive HTN is not required at this point given her initial symptoms began last Friday on 01/12/20. It is recommended to slowly normalize blood pressure over the next 5 days starting today avoiding acute drops in pressure. Have adjusted her IV Labetalol PRN to treat for SBP > 180. Her long term blood pressure goal is < 140/90. - Current SBP goal < 180, can target SBP < 160 starting 01/18/20  - Start anti htn regimen to gradually normalize blood pressures - IV PRN Labetalol for SBP > 180  #Hyperlipidemia From  a stroke prevention stand point, the LDL goal is < 70. Her LDL is 239. She has tried a statin in the past and can not recall which one but did have myalgias. That said, have started her on Rosuvastatin this admission which has a lower incidence of causing myalgias. Will also start her on CoQ10 to help with possible myalgias ( if this is stocked). If she is unable to tolerate Rosuvastatin then she should be considered for a PCSK9 inhibitor given her LDL is extremely elevated. - Continue Rosuvastatin 40 mg + CoQ 100 mg QD ( recommend prescribing CoQ10 at discharge or they can pick this up OTC if it is not stocked here in house)  - Consider PCSK9 inhibitor in the future if she can not tolerate Rosuvastatin   #Diabetes  She has a history of diabetes and has been on Metformin in the past. Her A1C is at 6.4 which is at goal from a stroke standpoint, targeting A1C < 7. She has not been taking her Metformin.This should either be started at discharge or she will need to follow up with her PCP for this.   Given her stroke work up is complete, will sign off at this time. Please contact stroke via Amion with any questions.   Hospital day #  1  Stark Jock, NP  [facility] Nurse Practitioner Patient seen and discussed with attending physician Dr. Roda Shutters   ATTENDING NOTE: I reviewed above note and agree with the assessment and plan. Pt was seen and examined.   79 year old female with history of hypertension diabetes noncompliant with medication admitted for confusion, left-sided weakness, hypertension and gait difficulty.  CT showed right BG infarct.  MRI confirmed right BG/CR infarct but also showed left internal capsule punctate infarcts.  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  EF 65 to 70%.  A1c 6.4, LDL 239.  LDL 1.62 down to 1.42.  BP still elevated.  On exam, patient flat affect however orientated and answering questions appropriately.  Still has  left facial droop, mild left hand dexterity difficulty and slight weakness of left proximal lower extremity.  Etiology of stroke likely due to small vessel disease given uncontrolled risk factors and typical stroke location.  However, given bilateral involvement, recommend 30-day cardiac event monitor as outpatient to rule out A. Fib.  Recommend aspirin 81 and Plavix 75 DAPT for 3 weeks and then either aspirin or Plavix alone.  Per patient and her daughter, patient has statin intolerance, however this was not documented anywhere.  Start her on Crestor 40 for hyperlipidemia.  If not tolerating, may consider PCSK9 inhibitors as outpatient.  Gradually normalize BP in 2 to 3 days.  Patient and her daughter's questions answered to their satisfaction.  Neurology will sign off. Please call with questions. Pt will follow up with stroke clinic NP at [facility] in about 4 weeks. Thanks for the consult.   Marvel Plan, MD PhD Stroke Neurology 01/18/2020 5:50 PM   To contact Stroke Continuity provider, please refer to [email or web address]. After hours, contact General Neurology

## 2020-01-18 NOTE — Evaluation (Signed)
Occupational Therapy Evaluation Patient Details Name: Heather [name] MRN: [id number] DOB: 1940-11-06 Today's Date: 01/18/2020    History of Present Illness Pt is a 79 year old woman with PMH of HTN, DM, HLD and medical non compliance admitted on 01/17/20 with confusion.  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    Clinical Impression   Pt was independent in self care and mobility prior to admission. Her daughters assisted with getting groceries/transportation as pt does not drive. Pt presents with impaired cognition, decreased balance and vision deficits. She requires min assist for ADL and mobility. Recommending CIR for intensive rehab with goal of returning home at a supervision level.     Follow Up Recommendations  CIR    Equipment Recommendations  3 in 1 bedside commode    Recommendations for Other Services       Precautions / Restrictions Precautions Precautions: Fall Precaution Comments: possible L lateral visual field cut, inferior>superior (reassess next session, pt reports deficit due to glasses) Restrictions Weight Bearing Restrictions: No      Mobility Bed Mobility Overal bed mobility: Needs Assistance Bed Mobility: Supine to Sit;Sit to Supine     Supine to sit: Min assist;HOB elevated Sit to supine: Min assist;HOB elevated   General bed mobility comments: min assist for completion of progression of LEs into and out of bed, scooting to/from EOB with boost assist from bed pad, and boost assist up in bed upon return to supine. Requires repeated verbal cuing to stay on task moving from supine>sit.  Transfers Overall transfer level: Needs assistance Equipment used: 2 person hand held assist Transfers: Sit to/from Stand Sit to Stand: Min assist;+2 safety/equipment         General transfer comment: min assist for power up, steadying, pt reaching out for HHA vs environment. Increased  time to rise and steady.    Balance Overall balance assessment: Needs assistance Sitting-balance support: No upper extremity supported;Feet supported Sitting balance-Leahy Scale: Fair Sitting balance - Comments: no LOB with donning socks   Standing balance support: Single extremity supported Standing balance-Leahy Scale: Fair Standing balance comment: able to ambulate with SL support very briefly                           ADL either performed or assessed with clinical judgement   ADL Overall ADL's : Needs assistance/impaired Eating/Feeding: Set up;Sitting   Grooming: Minimal assistance;Standing   Upper Body Bathing: Minimal assistance;Sitting   Lower Body Bathing: Minimal assistance;Sit to/from stand   Upper Body Dressing : Minimal assistance;Sitting   Lower Body Dressing: Minimal assistance;Sit to/from stand Lower Body Dressing Details (indicate cue type and reason): able to don socks Toilet Transfer: +2 for physical assistance;Minimal assistance;Ambulation   Toileting- Clothing Manipulation and Hygiene: Minimal assistance;Sit to/from stand       Functional mobility during ADLs: Minimal assistance;+2 for safety/equipment       Vision Baseline Vision/History: Wears glasses Wears Glasses: At all times Patient Visual Report: Blurring of vision Additional Comments: appears to have L visual field cut, needs further assessment, pt with new glasses within the las 2 weeks     Perception     Praxis      Pertinent Vitals/Pain Pain Assessment: No/denies pain Faces Pain Scale: Hurts a little bit Pain Location: ankles (chronic, per pt) Pain Descriptors / Indicators: Aching Pain Intervention(s): Monitored during session  Hand Dominance Right   Extremity/Trunk Assessment Upper Extremity Assessment Upper Extremity Assessment: Overall WFL for tasks assessed   Lower Extremity Assessment Lower Extremity Assessment: Defer to PT evaluation RLE Sensation: WNL  (screened) LLE Sensation: WNL (screened)   Cervical / Trunk Assessment Cervical / Trunk Assessment: Normal   Communication Communication Communication: No difficulties (low volume is baseline)   Cognition Arousal/Alertness: Awake/alert Behavior During Therapy: Flat affect Overall Cognitive Status: Impaired/Different from baseline Area of Impairment: Orientation;Attention;Following commands;Safety/judgement;Problem solving                 Orientation Level: Disoriented to;Situation Current Attention Level: Sustained   Following Commands: Follows one step commands consistently Safety/Judgement: Decreased awareness of deficits;Decreased awareness of safety   Problem Solving: Difficulty sequencing;Requires verbal cues;Requires tactile cues General Comments: Initially pt reports not knowing why she was in hospital, once oriented to CVA pt states "oh yeah, I had a stroke". Requires frequent VCs for staying on task, most notably during supine>sit.   General Comments  Visual field screen reveals L lateral visual field deficit, but inconsistent responses during testing so may need follow up testing. + L facial droop    Exercises     Shoulder Instructions      Home Living Family/patient expects to be discharged to:: Private residence Living Arrangements: Alone Available Help at Discharge: Family Type of Home: Apartment Home Access: Level entry     Home Layout: One level     Bathroom Shower/Tub: Chief Strategy Officer: Standard     Home Equipment: None          Prior Functioning/Environment Level of Independence: Independent        Comments: pt enjoys walking her dog, going shopping. Pt typically leans on grocery cart for support, but does not use AD at home        OT Problem List: Decreased activity tolerance;Impaired balance (sitting and/or standing);Decreased knowledge of use of DME or AE;Decreased cognition      OT Treatment/Interventions:  Self-care/ADL training;DME and/or AE instruction;Therapeutic activities;Cognitive remediation/compensation;Visual/perceptual remediation/compensation;Patient/family education;Balance training    OT Goals(Current goals can be found in the care plan section) Acute Rehab OT Goals Patient Stated Goal: go home OT Goal Formulation: With patient Time For Goal Achievement: 02/01/20 Potential to Achieve Goals: Good ADL Goals Pt Will Perform Grooming: with supervision;standing Pt Will Perform Upper Body Dressing: with supervision;sitting Pt Will Perform Lower Body Dressing: with supervision;sit to/from stand Pt Will Transfer to Toilet: with supervision;ambulating;bedside commode (over toilet) Pt Will Perform Toileting - Clothing Manipulation and hygiene: with supervision;sit to/from stand Additional ADL Goal #1: Pt will perform bed mobility modified independently in preparation for ADL. Additional ADL Goal #2: Pt will participate in vision testing. Additional ADL Goal #3: Pt will demonstrate selective attention as a precursor to ADL.  OT Frequency: Min 3X/week   Barriers to D/C:            Co-evaluation   Reason for Co-Treatment: To address functional/ADL transfers;For patient/therapist safety PT goals addressed during session: Mobility/safety with mobility;Balance        AM-PAC OT "6 Clicks" Daily Activity     Outcome Measure Help from another person eating meals?: A Little Help from another person taking care of personal grooming?: A Little Help from another person toileting, which includes using toliet, bedpan, or urinal?: A Little Help from another person bathing (including washing, rinsing, drying)?: A Little Help from another person to put on and taking off regular upper body clothing?: A  Little Help from another person to put on and taking off regular lower body clothing?: A Little 6 Click Score: 18   End of Session Equipment Utilized During Treatment: Gait belt Nurse  Communication: Mobility status  Activity Tolerance: Patient tolerated treatment well Patient left: in bed;with call bell/phone within reach;with bed alarm set  OT Visit Diagnosis: Unsteadiness on feet (R26.81);Other abnormalities of gait and mobility (R26.89);Other symptoms and signs involving cognitive function;Muscle weakness (generalized) (M62.81)                Time: 5638-7564 OT Time Calculation (min): 33 min Charges:  OT General Charges $OT Visit: 1 Visit OT Evaluation $OT Eval Moderate Complexity: 1 Mod  Martie Round, OTR/L Acute Rehabilitation Services Pager: [phone] Office: [phone]  Evern Bio 01/18/2020, 1:29 PM

## 2020-01-18 NOTE — Consult Note (Signed)
Physical Medicine and Rehabilitation Consult Reason for Consult:Stroke Referring Physician: Dr Lowell Guitar   HPI: Heather [name] is a 79 y.o. right-handed female with history of hypertension, diabetes mellitus, hyperlipidemia as well as documented medical noncompliance.  Per chart review patient lives alone independent prior to admission.  Children live 45 minutes away.  Presented 01/17/2020 with altered mental status.  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  Additional small acute infarcts of the left internal capsule and lentiform nucleus.  Patient did not receive TPA.  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  Admission chemistries creatinine 1.62, glucose 148, SARS coronavirus negative, hemoglobin A1c 6.4.  Echocardiogram with ejection fraction 65 to 70% no wall motion abnormalities.  Presently on aspirin and Plavix for CVA prophylaxis.  Subcutaneous Lovenox for DVT prophylaxis.  Therapy evaluations completed with recommendations of physical medicine rehab consult.   Pt reports her LBM was 3 days ago- "normal for her" and denies constipation.  Voiding OK.  Just got food for first time in 24+ hours.  Daughter admits pt "doing better" since got here, however pt doesn't acknowledge any deficits except a "little wobbly" with gait. Also denies pain.    Lives 45 minutes from rest of family- wasn't taking meds since "stopped seeing her PCP" when "everyone did"- sounds like she was talking about last March/April due to COVID- didn't find another PCP.    Review of Systems  Constitutional: Negative for chills and fever.  HENT: Negative for hearing loss.   Eyes: Negative for blurred vision and double vision.  Respiratory: Negative for cough and shortness of breath.   Cardiovascular: Negative for chest pain, palpitations and leg swelling.  Gastrointestinal: Positive for constipation. Negative for heartburn, nausea and  vomiting.  Genitourinary: Negative for dysuria and hematuria.  Musculoskeletal: Positive for myalgias.  Skin: Negative for rash.  Neurological: Positive for weakness.  All other systems reviewed and are negative.  Past Medical History:  Diagnosis Date  . Hypertension    History reviewed. No pertinent surgical history. No family history on file. Social History:  has no history on file for tobacco use, alcohol use, and drug use. Allergies:  Allergies  Allergen Reactions  . Iohexol Other (See Comments)    Reaction not recalled   Medications Prior to Admission  Medication Sig Dispense Refill  . aspirin EC 325 MG tablet Take 325 mg by mouth at bedtime.    . cephALEXin (KEFLEX) 500 MG capsule Take 500 mg by mouth 4 (four) times daily. FOR 7 DAYS      Home: Home Living Family/patient expects to be discharged to:: Private residence Living Arrangements: Alone Available Help at Discharge: Family Type of Home: Apartment Home Access: Level entry Home Layout: One level Bathroom Shower/Tub: Engineer, manufacturing systems: Standard Home Equipment: None  Functional History: Prior Function Level of Independence: Independent Comments: pt enjoys walking her dog, going shopping. Pt typically leans on grocery cart for support, but does not use AD at home Functional Status:  Mobility: Bed Mobility Overal bed mobility: Needs Assistance Bed Mobility: Supine to Sit, Sit to Supine Supine to sit: Min assist, HOB elevated Sit to supine: Min assist, HOB elevated General bed mobility comments: min assist for completion of progression of LEs into and out of bed, scooting to/from EOB with boost assist from bed pad, and boost assist up in bed upon return to supine. Requires repeated verbal cuing to stay on  task moving from supine>sit. Transfers Overall transfer level: Needs assistance Equipment used: 2 person hand held assist Transfers: Sit to/from Stand Sit to Stand: Min assist, +2  safety/equipment General transfer comment: min assist for power up, steadying, pt reaching out for HHA vs environment. Increased time to rise and steady. Ambulation/Gait Ambulation/Gait assistance: Min assist, +2 safety/equipment Gait Distance (Feet): 80 Feet Assistive device: 1 person hand held assist, 2 person hand held assist Gait Pattern/deviations: Step-through pattern, Decreased stride length, Drifts right/left, Staggering left, Trunk flexed General Gait Details: Min assist to steady, pt either requiring +2 HHA or +1 HHA and hallway railing. Verbal cuing for upright posture, maintaining straight trajectory as pt with preference for listing L. Gait velocity: decr    ADL: ADL Overall ADL's : Needs assistance/impaired Eating/Feeding: Set up, Sitting Grooming: Minimal assistance, Standing Upper Body Bathing: Minimal assistance, Sitting Lower Body Bathing: Minimal assistance, Sit to/from stand Upper Body Dressing : Minimal assistance, Sitting Lower Body Dressing: Minimal assistance, Sit to/from stand Lower Body Dressing Details (indicate cue type and reason): able to don socks Toilet Transfer: +2 for physical assistance, Minimal assistance, Ambulation Toileting- Clothing Manipulation and Hygiene: Minimal assistance, Sit to/from stand Functional mobility during ADLs: Minimal assistance, +2 for safety/equipment  Cognition: Cognition Overall Cognitive Status: Impaired/Different from baseline Orientation Level: Oriented X4 Cognition Arousal/Alertness: Awake/alert Behavior During Therapy: Flat affect Overall Cognitive Status: Impaired/Different from baseline Area of Impairment: Orientation, Attention, Following commands, Safety/judgement, Problem solving Orientation Level: Disoriented to, Situation Current Attention Level: Sustained Following Commands: Follows one step commands consistently Safety/Judgement: Decreased awareness of deficits, Decreased awareness of safety Problem  Solving: Difficulty sequencing, Requires verbal cues, Requires tactile cues General Comments: Initially pt reports not knowing why she was in hospital, once oriented to CVA pt states "oh yeah, I had a stroke". Requires frequent VCs for staying on task, most notably during supine>sit.  Blood pressure (!) 180/77, pulse (!) 53, temperature 98.6 F (37 C), temperature source Oral, resp. rate 20, height 5' (1.524 m), weight 63 kg, SpO2 100 %. Physical Exam Vitals and nursing note reviewed. Exam conducted with a chaperone present.  Constitutional:      General: She is not in acute distress.    Appearance: She is normal weight. She is not toxic-appearing.     Comments: Pt sitting up in bed- TV on; daughter in room, looks unhappy/upset, pursed lips, only answers direct questions and SLOW to answer/process, NAD  HENT:     Head: Normocephalic and atraumatic.     Comments:  might have RIGHT side mild droop- but corrects with actual smile; tongue midline;  Lips pursed the whole time.     Right Ear: External ear normal.     Left Ear: External ear normal.     Nose: Nose normal. No congestion.     Mouth/Throat:     Pharynx: Oropharynx is clear. No oropharyngeal exudate.  Eyes:     Extraocular Movements: Extraocular movements intact.     Comments: No nystagmus B/L  Cardiovascular:     Rate and Rhythm: Normal rate and regular rhythm.     Heart sounds: Normal heart sounds.  Pulmonary:     Comments: CTA B/L- no W/R/R- good air movement Abdominal:     Comments: Soft, NT, ND, (+)BS - hypoactive  Musculoskeletal:     Cervical back: Normal range of motion. No rigidity.     Comments: RUE- and LUE 5/5 in biceps, triceps, WE, grip and finger abd RLE 5-/5 in HF, KE, KF, DF  and PF LLE- HF 4+/5- otherwise 5-/5 in other muscles Pt denies any difference in strength  Skin:    General: Skin is warm and dry.     Comments: Still has makeup on - mascara smeared heels OK; IV L antecubital fossa- looks OK No skin  breakdown otherwise seen  Neurological:     Comments: Mood is flat but appropriate.  Makes eye contact with examiner.  Provides her name and age.  Follows simple commands.  Light touch intact in all 4 extremities Slow to process and answer questions- appears angry the entire visit, but could be confusion?      Results for orders placed or performed during the hospital encounter of 01/17/20 (from the past 24 hour(s))  SARS Coronavirus 2 by RT PCR (hospital order, performed in [facility] hospital lab) Nasopharyngeal Nasopharyngeal Swab     Status: None   Collection Time: 01/17/20  4:36 PM   Specimen: Nasopharyngeal Swab  Result Value Ref Range   SARS Coronavirus 2 NEGATIVE NEGATIVE  Hemoglobin A1c     Status: Abnormal   Collection Time: 01/17/20  6:28 PM  Result Value Ref Range   Hgb A1c MFr Bld 6.4 (H) 4.8 - 5.6 %   Mean Plasma Glucose 136.98 mg/dL  CBC     Status: None   Collection Time: 01/17/20  7:55 PM  Result Value Ref Range   WBC 7.0 4.0 - 10.5 K/uL   RBC 4.50 3.87 - 5.11 MIL/uL   Hemoglobin 13.7 12.0 - 15.0 g/dL   HCT 34.7 36 - 46 %   MCV 93.1 80.0 - 100.0 fL   MCH 30.4 26.0 - 34.0 pg   MCHC 32.7 30.0 - 36.0 g/dL   RDW 42.5 95.6 - 38.7 %   Platelets 232 150 - 400 K/uL   nRBC 0.0 0.0 - 0.2 %  Creatinine, serum     Status: Abnormal   Collection Time: 01/17/20  7:55 PM  Result Value Ref Range   Creatinine, Ser 1.42 (H) 0.44 - 1.00 mg/dL   GFR calc non Af Amer 35 (L) >60 mL/min   GFR calc Af Amer 41 (L) >60 mL/min  CBG monitoring, ED     Status: Abnormal   Collection Time: 01/17/20 10:16 PM  Result Value Ref Range   Glucose-Capillary 110 (H) 70 - 99 mg/dL  Glucose, capillary     Status: Abnormal   Collection Time: 01/18/20 12:23 AM  Result Value Ref Range   Glucose-Capillary 108 (H) 70 - 99 mg/dL  CBC with Differential/Platelet     Status: None   Collection Time: 01/18/20  2:28 AM  Result Value Ref Range   WBC 6.4 4.0 - 10.5 K/uL   RBC 4.32 3.87 - 5.11 MIL/uL    Hemoglobin 13.0 12.0 - 15.0 g/dL   HCT 56.4 36 - 46 %   MCV 91.9 80.0 - 100.0 fL   MCH 30.1 26.0 - 34.0 pg   MCHC 32.7 30.0 - 36.0 g/dL   RDW 33.2 95.1 - 88.4 %   Platelets 226 150 - 400 K/uL   nRBC 0.0 0.0 - 0.2 %   Neutrophils Relative % 64 %   Neutro Abs 4.1 1.7 - 7.7 K/uL   Lymphocytes Relative 23 %   Lymphs Abs 1.5 0.7 - 4.0 K/uL   Monocytes Relative 10 %   Monocytes Absolute 0.6 0 - 1 K/uL   Eosinophils Relative 2 %   Eosinophils Absolute 0.1 0 - 0 K/uL   Basophils Relative  1 %   Basophils Absolute 0.0 0 - 0 K/uL   Immature Granulocytes 0 %   Abs Immature Granulocytes 0.02 0.00 - 0.07 K/uL  Comprehensive metabolic panel     Status: Abnormal   Collection Time: 01/18/20  2:28 AM  Result Value Ref Range   Sodium 143 135 - 145 mmol/L   Potassium 3.0 (L) 3.5 - 5.1 mmol/L   Chloride 108 98 - 111 mmol/L   CO2 23 22 - 32 mmol/L   Glucose, Bld 114 (H) 70 - 99 mg/dL   BUN 17 8 - 23 mg/dL   Creatinine, Ser 1.61 (H) 0.44 - 1.00 mg/dL   Calcium 8.8 (L) 8.9 - 10.3 mg/dL   Total Protein 5.9 (L) 6.5 - 8.1 g/dL   Albumin 3.0 (L) 3.5 - 5.0 g/dL   AST 25 15 - 41 U/L   ALT 16 0 - 44 U/L   Alkaline Phosphatase 75 38 - 126 U/L   Total Bilirubin 0.7 0.3 - 1.2 mg/dL   GFR calc non Af Amer 39 (L) >60 mL/min   GFR calc Af Amer 45 (L) >60 mL/min   Anion gap 12 5 - 15  Magnesium     Status: None   Collection Time: 01/18/20  2:28 AM  Result Value Ref Range   Magnesium 1.9 1.7 - 2.4 mg/dL  Phosphorus     Status: None   Collection Time: 01/18/20  2:28 AM  Result Value Ref Range   Phosphorus 3.7 2.5 - 4.6 mg/dL  Lipid panel     Status: Abnormal   Collection Time: 01/18/20  2:28 AM  Result Value Ref Range   Cholesterol 298 (H) 0 - 200 mg/dL   Triglycerides 096 <045 mg/dL   HDL 37 (L) >40 mg/dL   Total CHOL/HDL Ratio 8.1 RATIO   VLDL 22 0 - 40 mg/dL   LDL Cholesterol 981 (H) 0 - 99 mg/dL  Glucose, capillary     Status: Abnormal   Collection Time: 01/18/20  7:43 AM  Result Value Ref  Range   Glucose-Capillary 133 (H) 70 - 99 mg/dL  Glucose, capillary     Status: None   Collection Time: 01/18/20 11:41 AM  Result Value Ref Range   Glucose-Capillary 76 70 - 99 mg/dL   CT HEAD WO CONTRAST  Result Date: 01/17/2020 CLINICAL DATA:  Stroke, follow-up EXAM: CT HEAD WITHOUT CONTRAST TECHNIQUE: Contiguous axial images were obtained from the base of the skull through the vertex without intravenous contrast. COMPARISON:  01/16/2020 FINDINGS: Brain: There is no acute intracranial hemorrhage or mass effect. Hypoattenuation is again identified at the right caudate and lentiform nucleus. No new loss of gray-white differentiation. Additional patchy and confluent areas of hypoattenuation in the supratentorial white matter are nonspecific but probably reflect stable chronic microvascular ischemic changes. Ventricles are stable in caliber. Vascular: There is atherosclerotic calcification at the skull base. Skull: Calvarium is unremarkable. Sinuses/Orbits: No acute finding. Other: None. IMPRESSION: Evolving acute to subacute right basal ganglia infarct. No acute intracranial hemorrhage. Electronically Signed   By: Guadlupe Spanish M.D.   On: 01/17/2020 14:12   MR ANGIO HEAD WO CONTRAST  Result Date: 01/17/2020 CLINICAL DATA:  Confusion and gait difficulty EXAM: MRI HEAD WITHOUT CONTRAST MRA HEAD WITHOUT CONTRAST MRA NECK WITHOUT CONTRAST TECHNIQUE: Multiplanar, multiecho pulse sequences of the brain and surrounding structures were obtained without intravenous contrast. Angiographic images of the Circle of Willis were obtained using MRA technique without intravenous contrast. Angiographic images of the neck were  obtained using MRA technique without intravenous contrast. Carotid stenosis measurements (when applicable) are obtained utilizing NASCET criteria, using the distal internal carotid diameter as the denominator. COMPARISON:  Recent CT imaging FINDINGS: MRI HEAD Brain: There is reduced diffusion  involving the right caudate and lentiform nuclei and adjacent white matter as seen on CT. Additionally, there are small foci of reduced diffusion contralaterally involving the internal capsule and lentiform nucleus. No evidence of intracranial hemorrhage. No significant mass effect. Additional patchy and confluent areas of T2 hyperintensity in the supratentorial and pontine white matter are nonspecific but probably reflect moderate to advanced chronic microvascular ischemic changes. There is no hydrocephalus. No extra-axial fluid collection. No intracranial mass. Vascular: Loss of the expected flow void of the visualized left vertebral artery. Major vessel flow voids at the skull base are otherwise preserved. Skull and upper cervical spine: Normal marrow signal is preserved. Sinuses/Orbits: Paranasal sinuses are aerated. Orbits are unremarkable. Other: Sella is unremarkable.  Mastoid air cells are clear. MRA HEAD Intracranial internal carotid arteries are patent with atherosclerotic irregularity. Middle and anterior cerebral arteries are patent. Right A1 ACA is dominant. Left A1 ACA is hypoplastic or absent. Intracranial right vertebral artery is patent. There is no flow related enhancement of the intracranial left vertebral artery. There is a patent left AICA/PICA. Basilar artery is patent with mild stenosis proximally. Posterior cerebral arteries are patent. Bilateral posterior communicating arteries are present and are the predominant supply of the posterior cerebral arteries. MRA NECK Common, internal, and external carotid arteries are patent. There is atherosclerotic plaque at the ICA origins with minimal stenosis. Extracranial right vertebral artery is patent. There is no flow related enhancement of the left vertebral artery. 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 Additional small acute infarcts of the left internal capsule and lentiform nucleus. Moderate to advanced  chronic microvascular ischemic changes. 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 No hemodynamically significant stenosis at the ICA origins. Electronically Signed   By: Guadlupe SpanishPraneil  Patel M.D.   On: 01/17/2020 20:09   MR ANGIO NECK WO CONTRAST  Result Date: 01/17/2020 CLINICAL DATA:  Confusion and gait difficulty EXAM: MRI HEAD WITHOUT CONTRAST MRA HEAD WITHOUT CONTRAST MRA NECK WITHOUT CONTRAST TECHNIQUE: Multiplanar, multiecho pulse sequences of the brain and surrounding structures were obtained without intravenous contrast. Angiographic images of the Circle of Willis were obtained using MRA technique without intravenous contrast. Angiographic images of the neck were obtained using MRA technique without intravenous contrast. Carotid stenosis measurements (when applicable) are obtained utilizing NASCET criteria, using the distal internal carotid diameter as the denominator. COMPARISON:  Recent CT imaging FINDINGS: MRI HEAD Brain: There is reduced diffusion involving the right caudate and lentiform nuclei and adjacent white matter as seen on CT. Additionally, there are small foci of reduced diffusion contralaterally involving the internal capsule and lentiform nucleus. No evidence of intracranial hemorrhage. No significant mass effect. Additional patchy and confluent areas of T2 hyperintensity in the supratentorial and pontine white matter are nonspecific but probably reflect moderate to advanced chronic microvascular ischemic changes. There is no hydrocephalus. No extra-axial fluid collection. No intracranial mass. Vascular: Loss of the expected flow void of the visualized left vertebral artery. Major vessel flow voids at the skull base are otherwise preserved. Skull and upper cervical spine: Normal marrow signal is preserved. Sinuses/Orbits: Paranasal sinuses are aerated. Orbits are unremarkable. Other: Sella is unremarkable.  Mastoid air cells are clear. MRA HEAD  Intracranial internal carotid arteries  are patent with atherosclerotic irregularity. Middle and anterior cerebral arteries are patent. Right A1 ACA is dominant. Left A1 ACA is hypoplastic or absent. Intracranial right vertebral artery is patent. There is no flow related enhancement of the intracranial left vertebral artery. There is a patent left AICA/PICA. Basilar artery is patent with mild stenosis proximally. Posterior cerebral arteries are patent. Bilateral posterior communicating arteries are present and are the predominant supply of the posterior cerebral arteries. MRA NECK Common, internal, and external carotid arteries are patent. There is atherosclerotic plaque at the ICA origins with minimal stenosis. Extracranial right vertebral artery is patent. There is no flow related enhancement of the left vertebral artery. 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 Additional small acute infarcts of the left internal capsule and lentiform nucleus. 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 No hemodynamically significant stenosis at the ICA origins. Electronically Signed   By: Guadlupe Spanish M.D.   On: 01/17/2020 20:09   MR BRAIN WO CONTRAST  Result Date: 01/17/2020 CLINICAL DATA:  Confusion and gait difficulty EXAM: MRI HEAD WITHOUT CONTRAST MRA HEAD WITHOUT CONTRAST MRA NECK WITHOUT CONTRAST TECHNIQUE: Multiplanar, multiecho pulse sequences of the brain and surrounding structures were obtained without intravenous contrast. Angiographic images of the Circle of Willis were obtained using MRA technique without intravenous contrast. Angiographic images of the neck were obtained using MRA technique without intravenous contrast. Carotid stenosis measurements (when applicable) are obtained utilizing NASCET criteria, using the distal internal carotid diameter as the  denominator. COMPARISON:  Recent CT imaging FINDINGS: MRI HEAD Brain: There is reduced diffusion involving the right caudate and lentiform nuclei and adjacent white matter as seen on CT. Additionally, there are small foci of reduced diffusion contralaterally involving the internal capsule and lentiform nucleus. No evidence of intracranial hemorrhage. No significant mass effect. Additional patchy and confluent areas of T2 hyperintensity in the supratentorial and pontine white matter are nonspecific but probably reflect moderate to advanced chronic microvascular ischemic changes. There is no hydrocephalus. No extra-axial fluid collection. No intracranial mass. Vascular: Loss of the expected flow void of the visualized left vertebral artery. Major vessel flow voids at the skull base are otherwise preserved. Skull and upper cervical spine: Normal marrow signal is preserved. Sinuses/Orbits: Paranasal sinuses are aerated. Orbits are unremarkable. Other: Sella is unremarkable.  Mastoid air cells are clear. MRA HEAD Intracranial internal carotid arteries are patent with atherosclerotic irregularity. Middle and anterior cerebral arteries are patent. Right A1 ACA is dominant. Left A1 ACA is hypoplastic or absent. Intracranial right vertebral artery is patent. There is no flow related enhancement of the intracranial left vertebral artery. There is a patent left AICA/PICA. Basilar artery is patent with mild stenosis proximally. Posterior cerebral arteries are patent. Bilateral posterior communicating arteries are present and are the predominant supply of the posterior cerebral arteries. MRA NECK Common, internal, and external carotid arteries are patent. There is atherosclerotic plaque at the ICA origins with minimal stenosis. Extracranial right vertebral artery is patent. There is no flow related enhancement of the left vertebral artery. 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  Additional small acute infarcts of the left internal capsule and lentiform nucleus. 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 No hemodynamically significant stenosis at the ICA origins. Electronically Signed   By: Guadlupe Spanish M.D.   On:  01/17/2020 20:09   ECHOCARDIOGRAM COMPLETE  Result Date: 01/18/2020    ECHOCARDIOGRAM REPORT   Patient Name:   TERECIA PLAUT [facility] Date of Exam: 01/18/2020 Medical Rec #:  [id number]      Height:       60.0 in Accession #:    [id number]     Weight:       138.9 lb Date of Birth:  1941/02/28      BSA:          1.599 m Patient Age:    79 years       BP:           160/92 mmHg Patient Gender: F              HR:           61 bpm. Exam Location:  Inpatient Procedure: 2D Echo, Cardiac Doppler and Color Doppler Indications:    Stroke 434.91 / I163.9  History:        Patient has no prior history of Echocardiogram examinations.                 Risk Factors:Hypertension.  Sonographer:    Eulah Pont RDCS Referring Phys: [phone] A CALDWELL POWELL JR IMPRESSIONS  1. Left ventricular ejection fraction, by estimation, is 65 to 70%. The left ventricle has normal function. The left ventricle has no regional wall motion abnormalities. Left ventricular diastolic parameters are indeterminate.  2. Right ventricular systolic function is normal. The right ventricular size is normal. There is normal pulmonary artery systolic pressure.  3. The mitral valve is normal in structure. Trivial mitral valve regurgitation. No evidence of mitral stenosis.  4. The aortic valve is tricuspid. Aortic valve regurgitation is not visualized. No aortic stenosis is present. Comparison(s): No prior Echocardiogram. Conclusion(s)/Recommendation(s): Normal biventricular function without evidence of hemodynamically significant valvular heart disease. No intracardiac source of embolism detected on this transthoracic study. A  transesophageal echocardiogram is recommended to exclude cardiac source of embolism if clinically indicated. FINDINGS  Left Ventricle: Left ventricular ejection fraction, by estimation, is 65 to 70%. The left ventricle has normal function. The left ventricle has no regional wall motion abnormalities. The left ventricular internal cavity size was normal in size. There is  no left ventricular hypertrophy. Left ventricular diastolic parameters are indeterminate. Right Ventricle: The right ventricular size is normal. No increase in right ventricular wall thickness. Right ventricular systolic function is normal. There is normal pulmonary artery systolic pressure. The tricuspid regurgitant velocity is 1.81 m/s, and  with an assumed right atrial pressure of 8 mmHg, the estimated right ventricular systolic pressure is 21.1 mmHg. Left Atrium: Left atrial size was normal in size. Right Atrium: Right atrial size was normal in size. Pericardium: There is no evidence of pericardial effusion. Presence of pericardial fat pad. Mitral Valve: The mitral valve is normal in structure. Trivial mitral valve regurgitation. No evidence of mitral valve stenosis. Tricuspid Valve: The tricuspid valve is normal in structure. Tricuspid valve regurgitation is trivial. No evidence of tricuspid stenosis. Aortic Valve: The aortic valve is tricuspid. Aortic valve regurgitation is not visualized. No aortic stenosis is present. Pulmonic Valve: The pulmonic valve was not well visualized. Pulmonic valve regurgitation is not visualized. Aorta: The aortic root, ascending aorta and aortic arch are all structurally normal, with no evidence of dilitation or obstruction. Venous: The inferior vena cava was not well visualized. IAS/Shunts: No atrial level shunt detected by color flow Doppler.  LEFT VENTRICLE PLAX 2D LVIDd:  4.10 cm  Diastology LVIDs:         2.50 cm  LV e' medial:    3.94 cm/s LV PW:         1.00 cm  LV E/e' medial:  14.5 LV IVS:         1.00 cm  LV e' lateral:   4.56 cm/s LVOT diam:     1.90 cm  LV E/e' lateral: 12.5 LV SV:         42 LV SV Index:   26 LVOT Area:     2.84 cm  RIGHT VENTRICLE TAPSE (M-mode): 1.9 cm LEFT ATRIUM             Index       RIGHT ATRIUM           Index LA diam:        2.80 cm 1.75 cm/m  RA Area:     12.10 cm LA Vol (A2C):   28.7 ml 17.95 ml/m RA Volume:   26.00 ml  16.26 ml/m LA Vol (A4C):   29.2 ml 18.27 ml/m LA Biplane Vol: 30.3 ml 18.95 ml/m  AORTIC VALVE LVOT Vmax:   71.80 cm/s LVOT Vmean:  49.900 cm/s LVOT VTI:    0.148 m  AORTA Ao Root diam: 2.60 cm Ao Asc diam:  2.40 cm MITRAL VALVE               TRICUSPID VALVE MV Area (PHT): 2.73 cm    TR Peak grad:   13.1 mmHg MV Decel Time: 278 msec    TR Vmax:        181.00 cm/s MV E velocity: 57.00 cm/s MV A velocity: 71.10 cm/s  SHUNTS MV E/A ratio:  0.80        Systemic VTI:  0.15 m                            Systemic Diam: 1.90 cm Jodelle Red MD Electronically signed by Jodelle Red MD Signature Date/Time: 01/18/2020/11:49:35 AM    Final      Assessment/Plan: 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  1. Does the need for close, 24 hr/day medical supervision in concert with the patient's rehab needs make it unreasonable for this patient to be served in a less intensive setting? Yes 2. 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 Due to bowel management, safety, skin/wound care, disease management, medication administration, pain management and patient education, does the patient require 24 hr/day rehab nursing? Yes 4. Does the patient require coordinated care of a physician, rehab nurse, therapy disciplines of PT, OT and SLP for cognition to address physical and functional deficits in the context of the above medical diagnosis(es)? Yes Addressing deficits in the following areas: balance, endurance,  locomotion, strength, transferring, bathing, dressing, feeding, grooming, toileting and cognition 5. Can the patient actively participate in an intensive therapy program of at least 3 hrs of therapy per day at least 5 days per week? Yes 6. The potential for patient to make measurable gains while on inpatient rehab is excellent 7. Anticipated functional outcomes upon discharge from inpatient rehab are modified independent  with PT, modified independent and supervision with OT, modified independent and supervision with SLP. 8. Estimated rehab length of stay to reach the above functional goals is: 7-10 days 9. Anticipated discharge destination: Home 10. Overall Rehab/Functional  Prognosis: excellent  RECOMMENDATIONS: This patient's condition is appropriate for continued rehabilitative care in the following setting: [facility] Patient has agreed to participate in recommended program. Potentially Note that insurance prior authorization may be required for reimbursement for recommended care.  Comment:  1. Pt doesn't want to come to [facility] at this moment- daughter feels she needs to come.  2. Would benefit, if no BM by tomorrow, with some sorbitol or something to get her to go- no BM since Monday.  3. Will submit for insurance approval and see if can get pt in to CIR.  4. Thank you for this consult.   Mcarthur Rossetti Angiulli, PA-C 01/18/2020    I have personally performed a face to face diagnostic evaluation of this patient and formulated the key components of the plan.  Additionally, I have personally reviewed laboratory data, imaging studies, as well as relevant notes and concur with the physician assistant's documentation above.

## 2020-01-18 NOTE — Progress Notes (Signed)
PROGRESS NOTE    Heather [name]  KWI:[id number] DOB: [birth year] DOA: 01/17/2020 PCP: Patient, No Pcp Per  No chief complaint on file.  Brief Narrative:  Heather [name] is Heather [name] 79 y.o. female with medical history significant of HTN, DM, HLD presenting with confusion.  Hx obtained from daughter due to the pt's mental status.  Last normal Thursday or Friday.  Heather [name] little wobbly, but not far from normal.  At baseline lives independently, has dog that she walks.  No cane or walker.  Does not drive, but cooks and cleans.  Daughters notices progressive confusion over the weekend.  Today, she said "dad didn't come home", but her husband passed away 10 years ago.  Seems to have Heather [name] flat affect, not normal emotions she usually has.  No fevers, chills, CP, SOB, abdominal pain.  She's not on many meds, but she d/c'd these after she stopped seeing her PCP.  No smoking or etoh.  She's vaccinated against COVID, received both doses of pfizer, last dose in April.   She was seen in the ED on the day prior to admission with negative head imaging.  Symptoms at that time were thought 2/2 UTI.     Assessment & Plan:   Active Problems:   Stroke [facility])  Acute Stroke MRI/MRA head/neck showing Acute Infarcts of the R Basal Ganglia and Adjacent White Matter as well as Small Infarcts of L internal Capsule and lentiform nucleus.  Chronically occluded L vertebral artery.  Mild proximal basilar artery stenosis.  No hemodynamically significant stenosis at ICA origins. Neurology c/s, appreciate recommendations - recommending DAPT x 21 days followed by ASA alone, crestor 40 mg daily, needs cardiac event monitor at discharge to eval for atrial fibrillation Needs neurology referral at discharge A1c 6.4, LDL 239 -> started on crestor No need for additional permissive hypertension per neurology, will gradually reduce BP PT/OT/SLP -> recommending CIR Echo without intracardiac source of embolism, normal EF (see report)  Acute  Metabolic Encephalopathy: Heather [name]&Ox3.  Improved, but not to her baseline.  Likely 2/2 above. Delirium precautions Follow TSH, B12, ammonia Also with UTI being treated as noted below  Hypertension: no need for permissive hypertension at this point.  Will gradually lower BP.  Start with amlodipine 5 mg.  Prn labetalol.    T2DM: A1c 6.4.  not on any home meds, will start SSI and follow bg's  HLD:  LDL 239.  Started on crestor. She's had myalgias before with statins.  Will need coq10 at discharge.  E. Coli UTI: cx with e. Coli sensitive to ancef (9/7 cx, in other chart).  Will narrow to ancef.  Plan for 7 days abx.    Hypokalemia: replace and follow  She needs PCP, TOC consult   DVT prophylaxis: lovenox Code Status: full  Family Communication: daughter at bedside Disposition:   Status is: Inpatient  Remains inpatient appropriate because:Inpatient level of care appropriate due to severity of illness   Dispo: The patient is from: Home              Anticipated d/c is to: CIR              Anticipated d/c date is: 1 day              Patient currently is not medically stable to d/c.       Consultants:   neurology  Procedures:  Echo IMPRESSIONS    1. Left ventricular ejection fraction, by estimation, is 65 to 70%. The  left ventricle has normal function. The left ventricle has no regional  wall motion abnormalities. Left ventricular diastolic parameters are  indeterminate.  2. Right ventricular systolic function is normal. The right ventricular  size is normal. There is normal pulmonary artery systolic pressure.  3. The mitral valve is normal in structure. Trivial mitral valve  regurgitation. No evidence of mitral stenosis.  4. The aortic valve is tricuspid. Aortic valve regurgitation is not  visualized. No aortic stenosis is present.   Comparison(s): No prior Echocardiogram.   Conclusion(s)/Recommendation(s): Normal biventricular function without  evidence of  hemodynamically significant valvular heart disease. No  intracardiac source of embolism detected on this transthoracic study. Heather [name]  transesophageal echocardiogram is  recommended to exclude cardiac source of embolism if clinically indicated.   Antimicrobials:  Anti-infectives (From admission, onward)   Start     Dose/Rate Route Frequency Ordered Stop   01/17/20 1900  cefTRIAXone (ROCEPHIN) 1 g in sodium chloride 0.9 % 100 mL IVPB        1 g 200 mL/hr over 30 Minutes Intravenous Every 24 hours 01/17/20 1847 01/21/20 1859     Subjective: No complaints Heather [name]&Ox3   Objective: Vitals:   01/18/20 0416 01/18/20 0633 01/18/20 0745 01/18/20 1143  BP: (!) 181/81  (!) 164/73 (!) 180/77  Pulse:   (!) 55 (!) 53  Resp: (!) Temp:   98.6 F (37 C) 98.6 F (37 C)  TempSrc:   Oral Oral  SpO2:   99% 100%  Weight:      Height:        Intake/Output Summary (Last 24 hours) at 01/18/2020 1451 Last data filed at 01/18/2020 1326 Gross per 24 hour  Intake 981.9 ml  Output 1350 ml  Net -368.1 ml   Filed Weights   01/17/20 1611 01/18/20 0020  Weight: 68 kg 63 kg    Examination:  General exam: Appears calm and comfortable  Respiratory system: Clear to auscultation. Respiratory effort normal. Cardiovascular system: S1 & S2 heard, RRR. Gastrointestinal system: Abdomen is nondistended, soft and nontender.  Central nervous system: Alert and oriented. Mild L facial droop.  Symmetric strength throughout. Extremities: no LEE Skin: No rashes, lesions or ulcers Psychiatry: Judgement and insight appear normal. Mood & affect appropriate.     Data Reviewed: I have personally reviewed following labs and imaging studies  CBC: Recent Labs  Lab 01/17/20 1226 01/17/20 1232 01/17/20 1955 01/18/20 0228  WBC 8.6  --  7.0 6.4  NEUTROABS 6.5  --   --  4.1  HGB 15.3* 15.3* 13.7 13.0  HCT 47.0* 45.0 41.9 39.7  MCV 93.8  --  93.1 91.9  PLT 288  --  232 226    Basic Metabolic Panel: Recent  Labs  Lab 01/17/20 1226 01/17/20 1232 01/17/20 1955 01/18/20 0228  NA 141 143  --  143  K 3.6 3.7  --  3.0*  CL 104 105  --  108  CO2 26  --   --  23  GLUCOSE 148* 142*  --  114*  BUN 20 22  --  17  CREATININE 1.62* 1.60* 1.42* 1.31*  CALCIUM 9.6  --   --  8.8*  MG  --   --   --  1.9  PHOS  --   --   --  3.7    GFR: Estimated Creatinine Clearance: 29.3 mL/min (Cadyn Fann) (by C-G formula based on SCr of 1.31 mg/dL (H)).  Liver Function Tests: Recent Labs  Lab 01/17/20 1226 01/18/20 0228  AST 25 25  ALT 17 16  ALKPHOS 91 75  BILITOT 0.7 0.7  PROT 6.8 5.9*  ALBUMIN 3.7 3.0*    CBG: Recent Labs  Lab 01/17/20 2216 01/18/20 0023 01/18/20 0743 01/18/20 1141  GLUCAP 110* 108* 133* 76     Recent Results (from the past 240 hour(s))  SARS Coronavirus 2 by RT PCR (hospital order, performed in [facility] hospital lab) Nasopharyngeal Nasopharyngeal Swab     Status: None   Collection Time: 01/17/20  4:36 PM   Specimen: Nasopharyngeal Swab  Result Value Ref Range Status   SARS Coronavirus 2 NEGATIVE NEGATIVE Final    Comment: (NOTE) SARS-CoV-2 target nucleic acids are NOT DETECTED.  The SARS-CoV-2 RNA is generally detectable in upper and lower respiratory specimens during the acute phase of infection. The lowest concentration of SARS-CoV-2 viral copies this assay can detect is 250 copies / mL. Crosley Stejskal negative result does not preclude SARS-CoV-2 infection and should not be used as the sole basis for treatment or other patient management decisions.  Sumayah Bearse negative result may occur with improper specimen collection / handling, submission of specimen other than nasopharyngeal swab, presence of viral mutation(s) within the areas targeted by this assay, and inadequate number of viral copies (<250 copies / mL). Jim Lundin negative result must be combined with clinical observations, patient history, and epidemiological information.  Fact Sheet for Patients:     BoilerBrush.com.cy  Fact Sheet for Healthcare Providers: https://pope.com/  This test is not yet approved or  cleared by the [place] FDA and has been authorized for detection and/or diagnosis of SARS-CoV-2 by FDA under an Emergency Use Authorization (EUA).  This EUA will remain in effect (meaning this test can be used) for the duration of the COVID-19 declaration under Section 564(b)(1) of the Act, 21 U.S.C. section 360bbb-3(b)(1), unless the authorization is terminated or revoked sooner.  Performed at [facility], [address] [place], [place] [address]          Radiology Studies: CT HEAD WO CONTRAST  Result Date: 01/17/2020 CLINICAL DATA:  Stroke, follow-up EXAM: CT HEAD WITHOUT CONTRAST TECHNIQUE: Contiguous axial images were obtained from the base of the skull through the vertex without intravenous contrast. COMPARISON:  01/16/2020 FINDINGS: Brain: There is no acute intracranial hemorrhage or mass effect. Hypoattenuation is again identified at the right caudate and lentiform nucleus. No new loss of gray-white differentiation. Additional patchy and confluent areas of hypoattenuation in the supratentorial white matter are nonspecific but probably reflect stable chronic microvascular ischemic changes. Ventricles are stable in caliber. Vascular: There is atherosclerotic calcification at the skull base. Skull: Calvarium is unremarkable. Sinuses/Orbits: No acute finding. Other: None. IMPRESSION: Evolving acute to subacute right basal ganglia infarct. No acute intracranial hemorrhage. Electronically Signed   By: Guadlupe Spanish M.D.   On: 01/17/2020 14:12   MR ANGIO HEAD WO CONTRAST  Result Date: 01/17/2020 CLINICAL DATA:  Confusion and gait difficulty EXAM: MRI HEAD WITHOUT CONTRAST MRA HEAD WITHOUT CONTRAST MRA NECK WITHOUT CONTRAST TECHNIQUE: Multiplanar, multiecho pulse sequences of the brain and surrounding structures were  obtained without intravenous contrast. Angiographic images of the Circle of Willis were obtained using MRA technique without intravenous contrast. Angiographic images of the neck were obtained using MRA technique without intravenous contrast. Carotid stenosis measurements (when applicable) are obtained utilizing NASCET criteria, using the distal internal carotid diameter as the denominator. COMPARISON:  Recent CT imaging FINDINGS: MRI HEAD Brain: There is reduced diffusion involving  the right caudate and lentiform nuclei and adjacent white matter as seen on CT. Additionally, there are small foci of reduced diffusion contralaterally involving the internal capsule and lentiform nucleus. No evidence of intracranial hemorrhage. No significant mass effect. Additional patchy and confluent areas of T2 hyperintensity in the supratentorial and pontine white matter are nonspecific but probably reflect moderate to advanced chronic microvascular ischemic changes. There is no hydrocephalus. No extra-axial fluid collection. No intracranial mass. Vascular: Loss of the expected flow void of the visualized left vertebral artery. Major vessel flow voids at the skull base are otherwise preserved. Skull and upper cervical spine: Normal marrow signal is preserved. Sinuses/Orbits: Paranasal sinuses are aerated. Orbits are unremarkable. Other: Sella is unremarkable.  Mastoid air cells are clear. MRA HEAD Intracranial internal carotid arteries are patent with atherosclerotic irregularity. Middle and anterior cerebral arteries are patent. Right A1 ACA is dominant. Left A1 ACA is hypoplastic or absent. Intracranial right vertebral artery is patent. There is no flow related enhancement of the intracranial left vertebral artery. There is Najat Olazabal patent left AICA/PICA. Basilar artery is patent with mild stenosis proximally. Posterior cerebral arteries are patent. Bilateral posterior communicating arteries are present and are the predominant supply of  the posterior cerebral arteries. MRA NECK Common, internal, and external carotid arteries are patent. There is atherosclerotic plaque at the ICA origins with minimal stenosis. Extracranial right vertebral artery is patent. There is no flow related enhancement of the left vertebral artery. 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 Additional small acute infarcts of the left internal capsule and lentiform nucleus. 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 No hemodynamically significant stenosis at the ICA origins. Electronically Signed   By: Guadlupe Spanish M.D.   On: 01/17/2020 20:09   MR ANGIO NECK WO CONTRAST  Result Date: 01/17/2020 CLINICAL DATA:  Confusion and gait difficulty EXAM: MRI HEAD WITHOUT CONTRAST MRA HEAD WITHOUT CONTRAST MRA NECK WITHOUT CONTRAST TECHNIQUE: Multiplanar, multiecho pulse sequences of the brain and surrounding structures were obtained without intravenous contrast. Angiographic images of the Circle of Willis were obtained using MRA technique without intravenous contrast. Angiographic images of the neck were obtained using MRA technique without intravenous contrast. Carotid stenosis measurements (when applicable) are obtained utilizing NASCET criteria, using the distal internal carotid diameter as the denominator. COMPARISON:  Recent CT imaging FINDINGS: MRI HEAD Brain: There is reduced diffusion involving the right caudate and lentiform nuclei and adjacent white matter as seen on CT. Additionally, there are small foci of reduced diffusion contralaterally involving the internal capsule and lentiform nucleus. No evidence of intracranial hemorrhage. No significant mass effect. Additional patchy and confluent areas of T2 hyperintensity in the supratentorial and pontine white matter are nonspecific but probably reflect moderate to advanced  chronic microvascular ischemic changes. There is no hydrocephalus. No extra-axial fluid collection. No intracranial mass. Vascular: Loss of the expected flow void of the visualized left vertebral artery. Major vessel flow voids at the skull base are otherwise preserved. Skull and upper cervical spine: Normal marrow signal is preserved. Sinuses/Orbits: Paranasal sinuses are aerated. Orbits are unremarkable. Other: Sella is unremarkable.  Mastoid air cells are clear. MRA HEAD Intracranial internal carotid arteries are patent with atherosclerotic irregularity. Middle and anterior cerebral arteries are patent. Right A1 ACA is dominant. Left A1 ACA is hypoplastic or absent. Intracranial right vertebral artery is patent. There is no flow related enhancement of the intracranial left vertebral  artery. There is Judith Campillo patent left AICA/PICA. Basilar artery is patent with mild stenosis proximally. Posterior cerebral arteries are patent. Bilateral posterior communicating arteries are present and are the predominant supply of the posterior cerebral arteries. MRA NECK Common, internal, and external carotid arteries are patent. There is atherosclerotic plaque at the ICA origins with minimal stenosis. Extracranial right vertebral artery is patent. There is no flow related enhancement of the left vertebral artery. 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 Additional small acute infarcts of the left internal capsule and lentiform nucleus. 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 No hemodynamically significant stenosis at the ICA origins. Electronically Signed   By: Guadlupe SpanishPraneil  Patel M.D.   On: 01/17/2020 20:09   MR BRAIN WO CONTRAST  Result Date: 01/17/2020 CLINICAL DATA:  Confusion and gait difficulty EXAM: MRI HEAD WITHOUT CONTRAST MRA HEAD WITHOUT CONTRAST MRA NECK WITHOUT CONTRAST  TECHNIQUE: Multiplanar, multiecho pulse sequences of the brain and surrounding structures were obtained without intravenous contrast. Angiographic images of the Circle of Willis were obtained using MRA technique without intravenous contrast. Angiographic images of the neck were obtained using MRA technique without intravenous contrast. Carotid stenosis measurements (when applicable) are obtained utilizing NASCET criteria, using the distal internal carotid diameter as the denominator. COMPARISON:  Recent CT imaging FINDINGS: MRI HEAD Brain: There is reduced diffusion involving the right caudate and lentiform nuclei and adjacent white matter as seen on CT. Additionally, there are small foci of reduced diffusion contralaterally involving the internal capsule and lentiform nucleus. No evidence of intracranial hemorrhage. No significant mass effect. Additional patchy and confluent areas of T2 hyperintensity in the supratentorial and pontine white matter are nonspecific but probably reflect moderate to advanced chronic microvascular ischemic changes. There is no hydrocephalus. No extra-axial fluid collection. No intracranial mass. Vascular: Loss of the expected flow void of the visualized left vertebral artery. Major vessel flow voids at the skull base are otherwise preserved. Skull and upper cervical spine: Normal marrow signal is preserved. Sinuses/Orbits: Paranasal sinuses are aerated. Orbits are unremarkable. Other: Sella is unremarkable.  Mastoid air cells are clear. MRA HEAD Intracranial internal carotid arteries are patent with atherosclerotic irregularity. Middle and anterior cerebral arteries are patent. Right A1 ACA is dominant. Left A1 ACA is hypoplastic or absent. Intracranial right vertebral artery is patent. There is no flow related enhancement of the intracranial left vertebral artery. There is Clennon Nasca patent left AICA/PICA. Basilar artery is patent with mild stenosis proximally. Posterior cerebral arteries are  patent. Bilateral posterior communicating arteries are present and are the predominant supply of the posterior cerebral arteries. MRA NECK Common, internal, and external carotid arteries are patent. There is atherosclerotic plaque at the ICA origins with minimal stenosis. Extracranial right vertebral artery is patent. There is no flow related enhancement of the left vertebral artery. 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 Additional small acute infarcts of the left internal capsule and lentiform nucleus. 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 No hemodynamically significant stenosis at the ICA origins. Electronically Signed   By: Guadlupe SpanishPraneil  Patel M.D.   On: 01/17/2020 20:09   ECHOCARDIOGRAM COMPLETE  Result Date: 01/18/2020    ECHOCARDIOGRAM REPORT   Patient Name:   [facility] Date of Exam: 01/18/2020 Medical Rec #:  [id number]      Height:  60.0 in Accession #:    [id number]     Weight:       138.9 lb Date of Birth:  1940-09-07      BSA:          1.599 m Patient Age:    79 years       BP:           160/92 mmHg Patient Gender: F              HR:           61 bpm. Exam Location:  Inpatient Procedure: 2D Echo, Cardiac Doppler and Color Doppler Indications:    Stroke 434.91 / I163.9  History:        Patient has no prior history of Echocardiogram examinations.                 Risk Factors:Hypertension.  Sonographer:    Eulah Pont RDCS Referring Phys: [phone] Aradhana Gin CALDWELL POWELL JR IMPRESSIONS  1. Left ventricular ejection fraction, by estimation, is 65 to 70%. The left ventricle has normal function. The left ventricle has no regional wall motion abnormalities. Left ventricular diastolic parameters are indeterminate.  2. Right ventricular systolic function is normal. The right ventricular size is normal. There is normal pulmonary artery systolic pressure.  3. The  mitral valve is normal in structure. Trivial mitral valve regurgitation. No evidence of mitral stenosis.  4. The aortic valve is tricuspid. Aortic valve regurgitation is not visualized. No aortic stenosis is present. Comparison(s): No prior Echocardiogram. Conclusion(s)/Recommendation(s): Normal biventricular function without evidence of hemodynamically significant valvular heart disease. No intracardiac source of embolism detected on this transthoracic study. Karmine Kauer transesophageal echocardiogram is recommended to exclude cardiac source of embolism if clinically indicated. FINDINGS  Left Ventricle: Left ventricular ejection fraction, by estimation, is 65 to 70%. The left ventricle has normal function. The left ventricle has no regional wall motion abnormalities. The left ventricular internal cavity size was normal in size. There is  no left ventricular hypertrophy. Left ventricular diastolic parameters are indeterminate. Right Ventricle: The right ventricular size is normal. No increase in right ventricular wall thickness. Right ventricular systolic function is normal. There is normal pulmonary artery systolic pressure. The tricuspid regurgitant velocity is 1.81 m/s, and  with an assumed right atrial pressure of 8 mmHg, the estimated right ventricular systolic pressure is 21.1 mmHg. Left Atrium: Left atrial size was normal in size. Right Atrium: Right atrial size was normal in size. Pericardium: There is no evidence of pericardial effusion. Presence of pericardial fat pad. Mitral Valve: The mitral valve is normal in structure. Trivial mitral valve regurgitation. No evidence of mitral valve stenosis. Tricuspid Valve: The tricuspid valve is normal in structure. Tricuspid valve regurgitation is trivial. No evidence of tricuspid stenosis. Aortic Valve: The aortic valve is tricuspid. Aortic valve regurgitation is not visualized. No aortic stenosis is present. Pulmonic Valve: The pulmonic valve was not well visualized.  Pulmonic valve regurgitation is not visualized. Aorta: The aortic root, ascending aorta and aortic arch are all structurally normal, with no evidence of dilitation or obstruction. Venous: The inferior vena cava was not well visualized. IAS/Shunts: No atrial level shunt detected by color flow Doppler.  LEFT VENTRICLE PLAX 2D LVIDd:         4.10 cm  Diastology LVIDs:         2.50 cm  LV e' medial:    3.94 cm/s LV PW:         1.00 cm  LV E/e' medial:  14.5 LV IVS:        1.00 cm  LV e' lateral:   4.56 cm/s LVOT diam:     1.90 cm  LV E/e' lateral: 12.5 LV SV:         42 LV SV Index:   26 LVOT Area:     2.84 cm  RIGHT VENTRICLE TAPSE (M-mode): 1.9 cm LEFT ATRIUM             Index       RIGHT ATRIUM           Index LA diam:        2.80 cm 1.75 cm/m  RA Area:     12.10 cm LA Vol (A2C):   28.7 ml 17.95 ml/m RA Volume:   26.00 ml  16.26 ml/m LA Vol (A4C):   29.2 ml 18.27 ml/m LA Biplane Vol: 30.3 ml 18.95 ml/m  AORTIC VALVE LVOT Vmax:   71.80 cm/s LVOT Vmean:  49.900 cm/s LVOT VTI:    0.148 m  AORTA Ao Root diam: 2.60 cm Ao Asc diam:  2.40 cm MITRAL VALVE               TRICUSPID VALVE MV Area (PHT): 2.73 cm    TR Peak grad:   13.1 mmHg MV Decel Time: 278 msec    TR Vmax:        181.00 cm/s MV E velocity: 57.00 cm/s MV Brae Gartman velocity: 71.10 cm/s  SHUNTS MV E/Dequarius Jeffries ratio:  0.80        Systemic VTI:  0.15 m                            Systemic Diam: 1.90 cm Jodelle Red MD Electronically signed by Jodelle Red MD Signature Date/Time: 01/18/2020/11:49:35 AM    Final         Scheduled Meds: .  stroke: mapping our early stages of recovery book   Does not apply Once  . aspirin EC  81 mg Oral Daily  . clopidogrel  75 mg Oral Daily  . enoxaparin (LOVENOX) injection  30 mg Subcutaneous Q24H  . insulin aspart  0-5 Units Subcutaneous QHS  . insulin aspart  0-9 Units Subcutaneous TID WC  . rosuvastatin  40 mg Oral Daily   Continuous Infusions: . sodium chloride 75 mL/hr at 01/18/20 0444  . cefTRIAXone  (ROCEPHIN)  IV Stopped (01/17/20 2026)     LOS: 1 day    Time spent: over 30 min    Lacretia Nicks, MD [facility]   To contact the attending provider between 7A-7P or the covering provider during after hours 7P-7A, please log into the web site [email or web address] and access using universal [place] password for that web site. If you do not have the password, please call the hospital operator.  01/18/2020, 2:51 PM

## 2020-01-18 NOTE — Progress Notes (Signed)
  Echocardiogram 2D Echocardiogram has been performed.  Augustine Radar 01/18/2020, 9:20 AM

## 2020-01-18 NOTE — Progress Notes (Signed)
Rehab Admissions Coordinator Note:  Patient was screened by Clois Dupes for appropriateness for an Inpatient Acute Rehab Consult per therapy recs.   At this time, we are recommending Inpatient Rehab consult. I will place order per protocol.  Clois Dupes RN MSN 01/18/2020, 1:09 PM  I can be reached at [phone].

## 2020-01-18 NOTE — Plan of Care (Signed)

## 2020-01-18 NOTE — Evaluation (Signed)
Physical Therapy Evaluation Patient Details Name: Heather [name] MRN: [id number] DOB: 13-Dec-1940 Today's Date: 01/18/2020   History of Present Illness  Pt is a 79 year old woman with PMH of HTN, DM, HLD and medical non compliance admitted on 01/17/20 with confusion.  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   Clinical Impression   Pt presents with generalized weakness, impaired standing balance, possible L visual field deficits, impaired attentiona and safety awareness, and decreased activity tolerance vs baseline. Pt to benefit from acute PT to address deficits. Pt ambulated hallway distance with min +2 assist for steadying and HHA, pt listing to L requiring cuing to correct. At baseline, pt is completely independent and enjoys walking dog, shopping. Pt has a supportive family, would benefit from CIR post-acutely to return to PLOF and independence. PT to progress mobility as tolerated, and will continue to follow acutely.      Follow Up Recommendations CIR    Equipment Recommendations  Other (comment) (tbd)    Recommendations for Other Services       Precautions / Restrictions Precautions Precautions: Fall Precaution Comments: possible L lateral visual field cut, inferior>superior (reassess next session, pt reports deficit due to glasses) Restrictions Weight Bearing Restrictions: No      Mobility  Bed Mobility Overal bed mobility: Needs Assistance Bed Mobility: Supine to Sit;Sit to Supine     Supine to sit: Min assist;HOB elevated Sit to supine: Min assist;HOB elevated   General bed mobility comments: min assist for completion of progression of LEs into and out of bed, scooting to/from EOB with boost assist from bed pad, and boost assist up in bed upon return to supine. Requires repeated verbal cuing to stay on task moving from supine>sit.  Transfers Overall transfer level: Needs  assistance Equipment used: 2 person hand held assist Transfers: Sit to/from Stand Sit to Stand: Min assist;+2 safety/equipment         General transfer comment: min assist for power up, steadying, pt reaching out for HHA vs environment. Increased time to rise and steady.  Ambulation/Gait Ambulation/Gait assistance: Min assist;+2 safety/equipment Gait Distance (Feet): 80 Feet Assistive device: 1 person hand held assist;2 person hand held assist Gait Pattern/deviations: Step-through pattern;Decreased stride length;Drifts right/left;Staggering left;Trunk flexed Gait velocity: decr   General Gait Details: Min assist to steady, pt either requiring +2 HHA or +1 HHA and hallway railing. Verbal cuing for upright posture, maintaining straight trajectory as pt with preference for listing L.  Stairs            Wheelchair Mobility    Modified Rankin (Stroke Patients Only)       Balance Overall balance assessment: Needs assistance Sitting-balance support: No upper extremity supported;Feet supported Sitting balance-Leahy Scale: Fair     Standing balance support: Single extremity supported Standing balance-Leahy Scale: Fair Standing balance comment: able to ambulate with SL support very briefly                             Pertinent Vitals/Pain Pain Assessment: Faces Faces Pain Scale: Hurts a little bit Pain Location: ankles (chronic, per pt) Pain Descriptors / Indicators: Aching Pain Intervention(s): Limited activity within patient's tolerance;Monitored during session    Home Living Family/patient expects to be discharged to:: Private residence Living Arrangements: Alone Available Help at Discharge: Family (children live 45 minutes away) Type of Home: Apartment Home Access: Level  entry     Home Layout: One level Home Equipment: None      Prior Function Level of Independence: Independent         Comments: pt enjoys walking her dog, going shopping. Pt  typically leans on grocery cart for support, but does not use AD at home     Hand Dominance   Dominant Hand: Right    Extremity/Trunk Assessment   Upper Extremity Assessment Upper Extremity Assessment: Defer to OT evaluation    Lower Extremity Assessment Lower Extremity Assessment: Generalized weakness;LLE deficits/detail;RLE deficits/detail (most notably during gait, appears symmetric L vs R) RLE Sensation: WNL (screened) LLE Sensation: WNL (screened)    Cervical / Trunk Assessment Cervical / Trunk Assessment: Normal  Communication   Communication: No difficulties  Cognition Arousal/Alertness: Awake/alert Behavior During Therapy: Flat affect Overall Cognitive Status: Impaired/Different from baseline Area of Impairment: Orientation;Attention;Following commands;Safety/judgement;Problem solving                 Orientation Level: Disoriented to;Situation Current Attention Level: Sustained   Following Commands: Follows one step commands consistently Safety/Judgement: Decreased awareness of deficits;Decreased awareness of safety   Problem Solving: Difficulty sequencing;Requires verbal cues;Requires tactile cues General Comments: Initially pt reports not knowing why she was in hospital, once oriented to CVA pt states "oh yeah, I had a stroke". Requires frequent VCs for staying on task, most notably during supine>sit.      General Comments General comments (skin integrity, edema, etc.): Visual field screen reveals L lateral visual field deficit, but inconsistent responses during testing so may need follow up testing. + L facial droop    Exercises     Assessment/Plan    PT Assessment Patient needs continued PT services  PT Problem List Decreased strength;Decreased mobility;Decreased safety awareness;Decreased activity tolerance;Decreased balance;Decreased knowledge of use of DME;Decreased cognition       PT Treatment Interventions DME instruction;Therapeutic  activities;Gait training;Therapeutic exercise;Patient/family education;Balance training;Functional mobility training;Neuromuscular re-education    PT Goals (Current goals can be found in the Care Plan section)  Acute Rehab PT Goals Patient Stated Goal: go home PT Goal Formulation: With patient/family Time For Goal Achievement: 02/01/20 Potential to Achieve Goals: Good    Frequency Min 4X/week   Barriers to discharge        Co-evaluation PT/OT/SLP Co-Evaluation/Treatment: Yes Reason for Co-Treatment: To address functional/ADL transfers;For patient/therapist safety PT goals addressed during session: Mobility/safety with mobility;Balance         AM-PAC PT "6 Clicks" Mobility  Outcome Measure Help needed turning from your back to your side while in a flat bed without using bedrails?: A Little Help needed moving from lying on your back to sitting on the side of a flat bed without using bedrails?: A Little Help needed moving to and from a bed to a chair (including a wheelchair)?: A Little Help needed standing up from a chair using your arms (e.g., wheelchair or bedside chair)?: A Little Help needed to walk in hospital room?: A Little Help needed climbing 3-5 steps with a railing? : A Lot 6 Click Score: 17    End of Session   Activity Tolerance: Patient tolerated treatment well;Patient limited by fatigue Patient left: in bed;with call bell/phone within reach;with bed alarm set;with family/visitor present Nurse Communication: Mobility status PT Visit Diagnosis: Other abnormalities of gait and mobility (R26.89);Muscle weakness (generalized) (M62.81);Difficulty in walking, not elsewhere classified (R26.2)    Time: 1610-9604 PT Time Calculation (min) (ACUTE ONLY): 32 min   Charges:   PT Evaluation $PT  Eval Low Complexity: 1 Low         Addylin Manke E, PT Acute Rehabilitation Services Pager [phone]  Office [phone]   Nicola Police 01/18/2020, 12:57 PM

## 2020-01-19 ENCOUNTER — Telehealth: Payer: Self-pay

## 2020-01-19 ENCOUNTER — Ambulatory Visit: Payer: Self-pay | Admitting: Family

## 2020-01-19 DIAGNOSIS — I63313 Cerebral infarction due to thrombosis of bilateral middle cerebral arteries: Secondary | ICD-10-CM

## 2020-01-19 LAB — CBC WITH DIFFERENTIAL/PLATELET
Abs Immature Granulocytes: 0.01 10*3/uL (ref 0.00–0.07)
Basophils Absolute: 0 10*3/uL (ref 0.0–0.1)
Basophils Relative: 1 %
Eosinophils Absolute: 0.1 10*3/uL (ref 0.0–0.5)
Eosinophils Relative: 2 %
HCT: 43.5 % (ref 36.0–46.0)
Hemoglobin: 14.2 g/dL (ref 12.0–15.0)
Immature Granulocytes: 0 %
Lymphocytes Relative: 27 %
Lymphs Abs: 1.7 10*3/uL (ref 0.7–4.0)
MCH: 30.3 pg (ref 26.0–34.0)
MCHC: 32.6 g/dL (ref 30.0–36.0)
MCV: 92.8 fL (ref 80.0–100.0)
Monocytes Absolute: 0.5 10*3/uL (ref 0.1–1.0)
Monocytes Relative: 8 %
Neutro Abs: 3.8 10*3/uL (ref 1.7–7.7)
Neutrophils Relative %: 62 %
Platelets: 224 10*3/uL (ref 150–400)
RBC: 4.69 MIL/uL (ref 3.87–5.11)
RDW: 13.5 % (ref 11.5–15.5)
WBC: 6.1 10*3/uL (ref 4.0–10.5)
nRBC: 0 % (ref 0.0–0.2)

## 2020-01-19 LAB — PHOSPHORUS: Phosphorus: 3.1 mg/dL (ref 2.5–4.6)

## 2020-01-19 LAB — COMPREHENSIVE METABOLIC PANEL
ALT: 18 U/L (ref 0–44)
AST: 25 U/L (ref 15–41)
Albumin: 3.3 g/dL — ABNORMAL LOW (ref 3.5–5.0)
Alkaline Phosphatase: 81 U/L (ref 38–126)
Anion gap: 11 (ref 5–15)
BUN: 18 mg/dL (ref 8–23)
CO2: 22 mmol/L (ref 22–32)
Calcium: 9 mg/dL (ref 8.9–10.3)
Chloride: 106 mmol/L (ref 98–111)
Creatinine, Ser: 1.23 mg/dL — ABNORMAL HIGH (ref 0.44–1.00)
GFR calc Af Amer: 49 mL/min — ABNORMAL LOW (ref >60)
GFR calc non Af Amer: 42 mL/min — ABNORMAL LOW (ref >60)
Glucose, Bld: 102 mg/dL — ABNORMAL HIGH (ref 70–99)
Potassium: 3.6 mmol/L (ref 3.5–5.1)
Sodium: 139 mmol/L (ref 135–145)
Total Bilirubin: 0.7 mg/dL (ref 0.3–1.2)
Total Protein: 6.4 g/dL — ABNORMAL LOW (ref 6.5–8.1)

## 2020-01-19 LAB — GLUCOSE, CAPILLARY
Glucose-Capillary: 169 mg/dL — ABNORMAL HIGH (ref 70–99)
Glucose-Capillary: 179 mg/dL — ABNORMAL HIGH (ref 70–99)
Glucose-Capillary: 107 mg/dL — ABNORMAL HIGH (ref 70–99)
Glucose-Capillary: 176 mg/dL — ABNORMAL HIGH (ref 70–99)

## 2020-01-19 LAB — AMMONIA: Ammonia: 22 umol/L (ref 9–35)

## 2020-01-19 LAB — MAGNESIUM: Magnesium: 1.7 mg/dL (ref 1.7–2.4)

## 2020-01-19 LAB — TSH: TSH: 5.309 u[IU]/mL — ABNORMAL HIGH (ref 0.350–4.500)

## 2020-01-19 LAB — VITAMIN B12: Vitamin B-12: 364 pg/mL (ref 180–914)

## 2020-01-19 MED ORDER — AMLODIPINE BESYLATE 5 MG PO TABS
5.0000 mg | ORAL_TABLET | Freq: Once | ORAL | Status: AC
  Administered 2020-01-19: 5 mg via ORAL
  Filled 2020-01-19: qty 1

## 2020-01-19 MED ORDER — AMLODIPINE BESYLATE 10 MG PO TABS
10.0000 mg | ORAL_TABLET | Freq: Every day | ORAL | Status: DC
  Administered 2020-01-20 – 2020-01-22 (×3): 10 mg via ORAL
  Filled 2020-01-19 (×3): qty 1

## 2020-01-19 MED ORDER — CEFAZOLIN SODIUM-DEXTROSE 1-4 GM/50ML-% IV SOLN
1.0000 g | Freq: Three times a day (TID) | INTRAVENOUS | Status: DC
  Administered 2020-01-19 – 2020-01-21 (×6): 1 g via INTRAVENOUS
  Filled 2020-01-19 (×11): qty 50

## 2020-01-19 NOTE — Care Management Important Message (Signed)
Important Message  Patient Details  Name: Heather [name] MRN: [id number] Date of Birth: 1940-12-20   Medicare Important Message Given:  Yes     Dorena Bodo 01/19/2020, 2:38 PM

## 2020-01-19 NOTE — Progress Notes (Signed)
Physical Therapy Treatment Patient Details Name: Heather [name] MRN: [id number] DOB: [birth year] Today's Date: 01/19/2020    History of Present Illness Pt is a 79 year old woman with PMH of HTN, DM, HLD and medical non compliance admitted on 01/17/20 with confusion.  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     PT Comments    Pt continuing to present with very flat affect and attention deficits, requires repeated cuing and increased time to perform mobility tasks. Pt soiled upon PT arrival to room with no pt awareness of it, requires pericare assist this day. Pt ambulated x2 hallway distance with use of RW and min assist to steady, guide pt trajectory as pt with R listing and L inattention. PT continuing to recommend CIR post-acutely.    Follow Up Recommendations  CIR     Equipment Recommendations  Other (comment) (tbd)    Recommendations for Other Services       Precautions / Restrictions Precautions Precautions: Fall Precaution Comments: possible L lateral visual field cut vs inattention, inferior>superior (reassess next session, pt reports deficit due to glasses) Restrictions Weight Bearing Restrictions: No    Mobility  Bed Mobility Overal bed mobility: Needs Assistance Bed Mobility: Supine to Sit     Supine to sit: Min assist;HOB elevated     General bed mobility comments: min assist for trunk elevation and scooting to EOB with use of bed pad. Increased time and effort.  Transfers Overall transfer level: Needs assistance Equipment used: Rolling walker (2 wheeled) Transfers: Sit to/from Stand Sit to Stand: Min assist         General transfer comment: min assist to rise and steady, verbal cuing for hand placement when rising/sitting.  Ambulation/Gait Ambulation/Gait assistance: Min guard;Min assist Gait Distance (Feet): 75 Feet (x2, standing rest break) Assistive device: Rolling  walker (2 wheeled) Gait Pattern/deviations: Step-through pattern;Decreased stride length;Drifts right/left;Trunk flexed;Staggering right Gait velocity: decr   General Gait Details: Min guard for safety, with intermittent min assist to steady, guide pt and RW trajectory as pt with R listing today. Pt requires verbal and tactile cuing to attend to L.   Stairs             Wheelchair Mobility    Modified Rankin (Stroke Patients Only)       Balance Overall balance assessment: Needs assistance   Sitting balance-Leahy Scale: Fair       Standing balance-Leahy Scale: Poor Standing balance comment: reliant on external support, attempted to don mask with bilateral UEs but could not due to unsteadiness                            Cognition Arousal/Alertness: Awake/alert Behavior During Therapy: Flat affect Overall Cognitive Status: Impaired/Different from baseline Area of Impairment: Orientation;Attention;Following commands;Safety/judgement;Problem solving                 Orientation Level: Disoriented to;Situation Current Attention Level: Sustained   Following Commands: Follows one step commands consistently Safety/Judgement: Decreased awareness of deficits;Decreased awareness of safety   Problem Solving: Difficulty sequencing;Requires verbal cues;Requires tactile cues General Comments: Pt unknowingly soiled in urine and stool upon arrival to room, per pt's daughter in room pt with no history of incontinence. Attentional deficits noted, i.e.Pt agreeable to PT, but kept playing candy crush on her ipad x1 minute until reminded about plan to mobilize OOB. Requires repeated verbal  cuing for safe mobility. Pt requires verbal and visual (follow my finger) cuing to attend to L side of hallway.      Exercises General Exercises - Lower Extremity Hip Flexion/Marching: AROM;10 reps;Both;Standing    General Comments        Pertinent Vitals/Pain Pain Assessment:  No/denies pain Pain Intervention(s): Limited activity within patient's tolerance;Monitored during session    Home Living                      Prior Function            PT Goals (current goals can now be found in the care plan section) Acute Rehab PT Goals Patient Stated Goal: go home PT Goal Formulation: With patient/family Time For Goal Achievement: 02/01/20 Potential to Achieve Goals: Good Progress towards PT goals: Progressing toward goals    Frequency    Min 4X/week      PT Plan Current plan remains appropriate    Co-evaluation              AM-PAC PT "6 Clicks" Mobility   Outcome Measure  Help needed turning from your back to your side while in a flat bed without using bedrails?: A Little Help needed moving from lying on your back to sitting on the side of a flat bed without using bedrails?: A Little Help needed moving to and from a bed to a chair (including a wheelchair)?: A Little Help needed standing up from a chair using your arms (e.g., wheelchair or bedside chair)?: A Little Help needed to walk in hospital room?: A Little Help needed climbing 3-5 steps with a railing? : A Lot 6 Click Score: 17    End of Session   Activity Tolerance: Patient tolerated treatment well;Patient limited by fatigue Patient left: with call bell/phone within reach;with bed alarm set;with family/visitor present;in chair;with nursing/sitter in room Nurse Communication: Mobility status PT Visit Diagnosis: Other abnormalities of gait and mobility (R26.89);Muscle weakness (generalized) (M62.81);Difficulty in walking, not elsewhere classified (R26.2)     Time: 1610-9604 PT Time Calculation (min) (ACUTE ONLY): 27 min  Charges:  $Gait Training: 8-22 mins $Therapeutic Activity: 8-22 mins                     Abeni Finchum E, PT Acute Rehabilitation Services Pager [phone]  Office [phone]    Tyrone Apple D Despina Hidden 01/19/2020, 5:27 PM

## 2020-01-19 NOTE — TOC Initial Note (Addendum)
Transition of Care [facility]) - Initial/Assessment Note    Patient Details  Name: Heather [name] MRN: [id number] Date of Birth: [birth year]  Transition of Care [facility]) CM/SW Contact:    Joanne Chars, LCSW Phone Number: 01/19/2020, 12:07 PM  Clinical Narrative:    CSW met with pt and daughter regarding discharge plan.  [facility] does not have beds today, pt still interested in possible CIR admit to [facility].  Choice document given.  Permission given to speak with both patient daughters and to send info to [employer].  Pt is vaccinated.  Daughter asking about POA assistance and MD made spiritual care consult to address this request.   CSW spoke with Claudia Pollock at [facility].  THey do have bed availability today and referral information was faxed.               1330: CSW spoke with Freda Munro at [facility].  They can offer a bed on Monday but not before.  Would need new covid test.     Expected Discharge Plan:  (CIR) Barriers to Discharge: Other (comment) (CIR bed availability)   Patient Goals and CMS Choice Patient states their goals for this hospitalization and ongoing recovery are:: get better, not be dependent CMS Medicare.gov Compare Post Acute Care list provided to:: Patient Represenative (must comment) (daughter) Choice offered to / list presented to : Adult Children  Expected Discharge Plan and Services Expected Discharge Plan:  (CIR)     Post Acute Care Choice: IP Rehab Living arrangements for the past 2 months: Apartment                                      Prior Living Arrangements/Services Living arrangements for the past 2 months: Apartment Lives with:: Self Patient language and need for interpreter reviewed:: Yes Do you feel safe going back to the place where you live?: Yes      Need for Family Participation in Patient Care: No (Comment) Care giver support system in place?: Yes (comment)   Criminal Activity/Legal Involvement Pertinent to Current  Situation/Hospitalization: No - Comment as needed  Activities of Daily Living      Permission Sought/Granted Permission sought to share information with : Facility Sport and exercise psychologist, Family Supports Permission granted to share information with : Yes, Verbal Permission Granted  Share Information with NAME: Sharyn Lull and Amy, daughters  Permission granted to share info w AGENCY: [facility]        Emotional Assessment Appearance:: Appears stated age Attitude/Demeanor/Rapport: Engaged Affect (typically observed): Pleasant Orientation: : Oriented to Self, Oriented to Place, Oriented to  Time, Oriented to Situation Alcohol / Substance Use: Not Applicable Psych Involvement: No (comment)  Admission diagnosis:  Stroke [facility]) [I63.9] Cerebrovascular accident (CVA), unspecified mechanism ([place]) [I63.9] Patient Active Problem List   Diagnosis Date Noted  . Stroke [facility]) 01/17/2020   PCP:  Patient, No Pcp Per Pharmacy:   [facility] [address] [place] [address] Phone: [phone] Fax: [phone]     Social Determinants of Health (SDOH) Interventions    Readmission Risk Interventions No flowsheet data found.

## 2020-01-19 NOTE — Telephone Encounter (Signed)
Post ED Visit - Positive Culture Follow-up  Culture report reviewed by antimicrobial stewardship pharmacist: [facility] []  , Pharm.D. []  Enzo Bi, Pharm.D., BCPS AQ-ID []  , Pharm.D., BCPS [x]  Celedonio Miyamoto, Pharm.D., BCPS []  [place], Garvin Fila.D., BCPS, AAHIVP []  , Pharm.D., BCPS, AAHIVP []  Georgina Pillion, PharmD, BCPS []  , PharmD, BCPS []  [place], PharmD, BCPS []  [place], PharmD []  , PharmD, BCPS []  Estella Husk, PharmD  Pharmacy Team []  Lysle Pearl, PharmD []  , PharmD []  Phillips Climes, PharmD []  , Rph []  Agapito Games) , PharmD []  Verlan Friends, PharmD []  , PharmD []  Mervyn Gay, PharmD []  , PharmD []  Vinnie Level, PharmD []  Wonda Olds, PharmD []  , PharmD []  Len Childs, PharmD   Positive urine culture Treated with Cephalexin, organism sensitive to the same and no further patient follow-up is required at this time.  01/19/2020, 9:36 AM

## 2020-01-19 NOTE — Progress Notes (Signed)
Pharmacy Antibiotic Note  Heather [name] is a 79 y.o. female admitted on 01/17/2020 with UTI.  Pharmacy has been consulted for Cefazolin dosing.  CrCL - 31 ml/min  Plan: D/c Ceftriaxone and narrow to Cefazolin 1 gram IV q8hr Monitor renal function and clinical status   Height: 5' (152.4 cm) Weight: 63 kg (138 lb 14.2 oz) IBW/kg (Calculated) : 45.5  Temp (24hrs), Avg:98.3 F (36.8 C), Min:97.9 F (36.6 C), Max:98.6 F (37 C)  Recent Labs  Lab 01/17/20 1226 01/17/20 1232 01/17/20 1955 01/18/20 0228 01/19/20 0107  WBC 8.6  --  7.0 6.4 6.1  CREATININE 1.62* 1.60* 1.42* 1.31* 1.23*    Estimated Creatinine Clearance: 31.2 mL/min (A) (by C-G formula based on SCr of 1.23 mg/dL (H)).    Allergies  Allergen Reactions  . Iohexol Other (See Comments)    Reaction not recalled    Antimicrobials this admission: Ceftriax  9/8>>9/9 Cefaz 9/10>>  Thank you for allowing pharmacy to be a part of this patient's care.  Jeanella Cara, PharmD, [facility] Clinical Pharmacist Please see AMION for all Pharmacists' Contact Phone Numbers 01/19/2020, 9:43 AM

## 2020-01-19 NOTE — Progress Notes (Signed)
   01/19/20 1335  Clinical Encounter Type  Visited With Other (Comment) (Called patient's room)  Visit Type Other (Comment)  Referral From Nurse  Consult/Referral To Chaplain  Per consult, patient request POA. Called patient's room and no answer.This note was prepared by Deneen Harts.  For questions please contact by phone [phone]

## 2020-01-19 NOTE — Evaluation (Signed)
Speech Language Pathology Evaluation Patient Details Name: Heather [name] MRN: [id number] DOB: 04-Oct-1940 Today's Date: 01/19/2020 Time: 8466-5993 SLP Time Calculation (min) (ACUTE ONLY): 26 min  Problem List:  Patient Active Problem List   Diagnosis Date Noted  . Stroke [facility]) 01/17/2020   Past Medical History:  Past Medical History:  Diagnosis Date  . Hypertension    Past Surgical History: History reviewed. No pertinent surgical history. HPI:  Pt is a 79 year old woman with PMH of HTN, DM, HLD and medical non compliance admitted on 01/17/20 with confusion.  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    Assessment / Plan / Recommendation Clinical Impression  Pt exhibits significant cognitive challenges that are different from baseline status. Daughter present reporting pt lived alone and daugter provided assist only with transportation and grocery shopping. Pt's affect is flat, monotone and appears apathetic- daughter agrees and notices has notes this as well. She is oriented, aware of stroke but impairments/implications are significantly impaired and she could appreciate significant changes compared to baseline. Language comprehension and expression are normal and speech is intelligible with decreased intensity (likely moreso due to affect). Left neglect/inattention with omission of numbers on left side of clock. Her hand placement on clock was inaccurate (mutiple times with cues). Pt needs 24 hour supervision at discharge and continued ST for cognitive impairments at discharge.           SLP Assessment  SLP Recommendation/Assessment: Patient needs continued Speech Lanaguage Pathology Services SLP Visit Diagnosis: Cognitive communication deficit (R41.841)    Follow Up Recommendations  Outpatient SLP;Home health SLP    Frequency and Duration min 2x/week  2 weeks      SLP Evaluation Cognition  Overall  Cognitive Status: Impaired/Different from baseline Arousal/Alertness: Awake/alert Orientation Level: Oriented to person;Oriented to place;Oriented to time;Oriented to situation Attention: Sustained Sustained Attention: Impaired Sustained Attention Impairment: Functional basic;Verbal basic Memory:  (will be specifically assessed) Awareness: Impaired Awareness Impairment: Intellectual impairment;Emergent impairment;Anticipatory impairment Problem Solving: Impaired Problem Solving Impairment: Verbal basic;Functional basic Executive Function:  (all impaired) Safety/Judgment: Impaired       Comprehension  Auditory Comprehension Overall Auditory Comprehension: Appears within functional limits for tasks assessed Visual Recognition/Discrimination Discrimination: Not tested Reading Comprehension Reading Status: Not tested    Expression Expression Primary Mode of Expression: Verbal Verbal Expression Overall Verbal Expression: Appears within functional limits for tasks assessed Pragmatics: Impairment Impairments: Abnormal affect;Monotone Written Expression Dominant Hand: Right Written Expression:  (wrote name only)   Oral / Motor  Oral Motor/Sensory Function Overall Oral Motor/Sensory Function: Other (comment) (trace weakness on left) Facial ROM: Reduced left;Suspected CN VII (facial) dysfunction Facial Symmetry: Abnormal symmetry left;Suspected CN VII (facial) dysfunction Facial Strength: Reduced left;Suspected CN VII (facial) dysfunction Mandible: Within Functional Limits Motor Speech Overall Motor Speech: Impaired Respiration: Within functional limits Phonation: Low vocal intensity Resonance: Within functional limits Articulation: Within functional limitis Intelligibility: Intelligible Motor Planning: Witnin functional limits   GO                    Heather [name] 01/19/2020, 10:50 AM  Heather [name] Heather [name].Ed Nurse, children's  [phone] Office [phone]

## 2020-01-19 NOTE — Progress Notes (Signed)
Inpatient Rehabilitation Admissions Coordinator  I will see patient today, but do not have a bed to admit this patient to today at [facility]. I will alert acute team and TOC.  Ottie Glazier, RN, MSN Rehab Admissions Coordinator [phone] 01/19/2020 9:01 AM

## 2020-01-19 NOTE — Progress Notes (Addendum)
PROGRESS NOTE    Heather [name]  PZW:[id number] DOB: 16-Nov-1940 DOA: 01/17/2020 PCP: Patient, No Pcp Per  No chief complaint on file.  Brief Narrative:  Heather [name] is Heather [name] 79 y.o. female with medical history significant of HTN, DM, HLD presenting with confusion.  Hx obtained from daughter due to the pt's mental status.  Last normal Thursday or Friday.  Heather [name] little wobbly, but not far from normal.  At baseline lives independently, has dog that she walks.  No cane or walker.  Does not drive, but cooks and cleans.  Daughters notices progressive confusion over the weekend.  Today, she said "dad didn't come home", but her husband passed away 10 years ago.  Seems to have Heather [name] flat affect, not normal emotions she usually has.  No fevers, chills, CP, SOB, abdominal pain.  She's not on many meds, but she d/c'd these after she stopped seeing her PCP.  No smoking or etoh.  She's vaccinated against COVID, received both doses of pfizer, last dose in April.   She was seen in the ED on the day prior to admission with negative head imaging.  Symptoms at that time were thought 2/2 UTI.    She was admitted with an acute stroke.  Plan now is for inpatient rehab when bed available (Monday in [facility]).   Assessment & Plan:   Active Problems:   Stroke [facility])  Acute Stroke MRI/MRA head/neck showing Acute Infarcts of the R Basal Ganglia and Adjacent White Matter as well as Small Infarcts of L internal Capsule and lentiform nucleus.  Chronically occluded L vertebral artery.  Mild proximal basilar artery stenosis.  No hemodynamically significant stenosis at ICA origins. Neurology c/s, appreciate recommendations - recommending DAPT x 21 days followed by ASA alone, crestor 40 mg daily, needs cardiac event monitor at discharge to eval for atrial fibrillation Needs neurology referral at discharge A1c 6.4, LDL 239 -> started on crestor No need for additional permissive hypertension per neurology, will gradually reduce  BP PT/OT/SLP -> recommending CIR Echo without intracardiac source of embolism, normal EF (see report)  Acute Metabolic Encephalopathy: Heather [name]&Ox3.  Continues to improve.  Likely 2/2 above. Delirium precautions Follow TSH (slightly elevated, follow outpatient), B12 (wnl), ammonia (wnl) Also with UTI being treated as noted below  Hypertension: no need for permissive hypertension at this point.  Will gradually lower BP.  Start with amlodipine 5 mg -> 10 mg today.  Prn labetalol.    T2DM: A1c 6.4, technically prediabetes range, but she reports prior hx of diabetes.   not on any home meds, will start SSI and follow bg's.  Consider metformin at discharge.  HLD:  LDL 239.  Started on crestor. She's had myalgias before with statins.  Will need coq10 at discharge.  Consider PCSK9 inhibitor if she can't tolerate crestor in future.  [name]. Coli UTI: cx with [name]. Coli sensitive to ancef (9/7 cx, in other chart - pt with 2 charts).  Will narrow to ancef.  Plan for 7 days abx.    Hypokalemia: replace and follow  She needs PCP, TOC consult   DVT prophylaxis: lovenox Code Status: full  Family Communication: daughter at bedside Disposition:   Status is: Inpatient  Remains inpatient appropriate because:Inpatient level of care appropriate due to severity of illness   Dispo: The patient is from: Home              Anticipated d/c is to: CIR  Anticipated d/c date is: 1 day              Patient currently is not medically stable to d/c.       Consultants:   neurology  Procedures:  Echo IMPRESSIONS    1. Left ventricular ejection fraction, by estimation, is 65 to 70%. The  left ventricle has normal function. The left ventricle has no regional  wall motion abnormalities. Left ventricular diastolic parameters are  indeterminate.  2. Right ventricular systolic function is normal. The right ventricular  size is normal. There is normal pulmonary artery systolic pressure.  3. The  mitral valve is normal in structure. Trivial mitral valve  regurgitation. No evidence of mitral stenosis.  4. The aortic valve is tricuspid. Aortic valve regurgitation is not  visualized. No aortic stenosis is present.   Comparison(s): No prior Echocardiogram.   Conclusion(s)/Recommendation(s): Normal biventricular function without  evidence of hemodynamically significant valvular heart disease. No  intracardiac source of embolism detected on this transthoracic study. Heather [name]  transesophageal echocardiogram is  recommended to exclude cardiac source of embolism if clinically indicated.   Antimicrobials:  Anti-infectives (From admission, onward)   Start     Dose/Rate Route Frequency Ordered Stop   01/19/20 0945  ceFAZolin (ANCEF) IVPB 1 g/50 mL premix        1 g 100 mL/hr over 30 Minutes Intravenous Every 8 hours 01/19/20 0938 01/24/20 0559   01/17/20 1900  cefTRIAXone (ROCEPHIN) 1 g in sodium chloride 0.9 % 100 mL IVPB  Status:  Discontinued        1 g 200 mL/hr over 30 Minutes Intravenous Every 24 hours 01/17/20 1847 01/19/20 0745     Subjective: Heather [name]&Ox3 No complaints   Objective: Vitals:   01/18/20 1915 01/18/20 2113 01/19/20 0800 01/19/20 1622  BP:  (!) 159/75 (!) 171/92 (!) 184/90  Pulse:  73 85 82  Resp: (!) 23 16 16 16   Temp:  98.5 F (36.9 C) 97.9 F (36.6 C) 98.2 F (36.8 C)  TempSrc:  Oral    SpO2:  95% 99% 98%  Weight:      Height:        Intake/Output Summary (Last 24 hours) at 01/19/2020 2011 Last data filed at 01/19/2020 1629 Gross per 24 hour  Intake 720 ml  Output 2000 ml  Net -1280 ml   Filed Weights   01/17/20 1611 01/18/20 0020  Weight: 68 kg 63 kg    Examination:  General: No acute distress. Cardiovascular: Heart sounds show Heather [name] regular rate, and rhythm Lungs: Clear to auscultation bilaterally Abdomen: Soft, nontender, nondistended Neurological: Alert and oriented 3. Moves all extremities 4. Mild L facial droop. Skin: Warm and dry. No rashes or  lesions. Extremities: No clubbing or cyanosis. No edema.     Data Reviewed: I have personally reviewed following labs and imaging studies  CBC: Recent Labs  Lab 01/17/20 1226 01/17/20 1232 01/17/20 1955 01/18/20 0228 01/19/20 0107  WBC 8.6  --  7.0 6.4 6.1  NEUTROABS 6.5  --   --  4.1 3.8  HGB 15.3* 15.3* 13.7 13.0 14.2  HCT 47.0* 45.0 41.9 39.7 43.5  MCV 93.8  --  93.1 91.9 92.8  PLT 288  --  232 226 224    Basic Metabolic Panel: Recent Labs  Lab 01/17/20 1226 01/17/20 1232 01/17/20 1955 01/18/20 0228 01/19/20 0107  NA 141 143  --  143 139  K 3.6 3.7  --  3.0* 3.6  CL 104 105  --  108 106  CO2 26  --   --  23 22  GLUCOSE 148* 142*  --  114* 102*  BUN 20 22  --  17 18  CREATININE 1.62* 1.60* 1.42* 1.31* 1.23*  CALCIUM 9.6  --   --  8.8* 9.0  MG  --   --   --  1.9 1.7  PHOS  --   --   --  3.7 3.1    GFR: Estimated Creatinine Clearance: 31.2 mL/min (Heather [name]) (by C-G formula based on SCr of 1.23 mg/dL (H)).  Liver Function Tests: Recent Labs  Lab 01/17/20 1226 01/18/20 0228 01/19/20 0107  AST 25 25 25   ALT 17 16 18   ALKPHOS 91 75 81  BILITOT 0.7 0.7 0.7  PROT 6.8 5.9* 6.4*  ALBUMIN 3.7 3.0* 3.3*    CBG: Recent Labs  Lab 01/18/20 1611 01/18/20 2109 01/19/20 0801 01/19/20 1234 01/19/20 1620  GLUCAP 98 93 169* 179* 107*     Recent Results (from the past 240 hour(s))  SARS Coronavirus 2 by RT PCR (hospital order, performed in [facility]) Nasopharyngeal Nasopharyngeal Swab     Status: None   Collection Time: 01/17/20  4:36 PM   Specimen: Nasopharyngeal Swab  Result Value Ref Range Status   SARS Coronavirus 2 NEGATIVE NEGATIVE Final    Comment: (NOTE) SARS-CoV-2 target nucleic acids are NOT DETECTED.  The SARS-CoV-2 RNA is generally detectable in upper and lower respiratory specimens during the acute phase of infection. The lowest concentration of SARS-CoV-2 viral copies this assay can detect is 250 copies / mL. Heather [name] negative result does  not preclude SARS-CoV-2 infection and should not be used as the sole basis for treatment or other patient management decisions.  Heather [name] negative result may occur with improper specimen collection / handling, submission of specimen other than nasopharyngeal swab, presence of viral mutation(s) within the areas targeted by this assay, and inadequate number of viral copies (<250 copies / mL). Heather [name] negative result must be combined with clinical observations, patient history, and epidemiological information.  Fact Sheet for Patients:   BoilerBrush.com.cyhttps://www.fda.gov/media/136312/download  Fact Sheet for Healthcare Providers: https://pope.com/https://www.fda.gov/media/136313/download  This test is not yet approved or  cleared by the [place] FDA and has been authorized for detection and/or diagnosis of SARS-CoV-2 by FDA under an Emergency Use Authorization (EUA).  This EUA will remain in effect (meaning this test can be used) for the duration of the COVID-19 declaration under Section 564(b)(1) of the Act, 21 U.S.C. section 360bbb-3(b)(1), unless the authorization is terminated or revoked sooner.  Performed at [facility], [address] [place], [place] [address]          Radiology Studies: ECHOCARDIOGRAM COMPLETE  Result Date: 01/18/2020    ECHOCARDIOGRAM REPORT   [facility]   [name] Date of Exam: 01/18/2020 Medical Rec #:  [id number]      Height:       60.0 in Accession #:    [id number]     Weight:       138.9 lb Date of Birth:  03-03-1941      BSA:          1.599 m Patient Age:    79 years       BP:           160/92 mmHg Patient Gender: F              HR:  61 bpm. Exam Location:  Inpatient Procedure: 2D Echo, Cardiac Doppler and Color Doppler Indications:    Stroke 434.91 / I163.9  History:        Patient has no prior history of Echocardiogram examinations.                 Risk Factors:Hypertension.  Sonographer:    Eulah Pont RDCS Referring Phys: [phone] Heather [name] IMPRESSIONS   1. Left ventricular ejection fraction, by estimation, is 65 to 70%. The left ventricle has normal function. The left ventricle has no regional wall motion abnormalities. Left ventricular diastolic parameters are indeterminate.  2. Right ventricular systolic function is normal. The right ventricular size is normal. There is normal pulmonary artery systolic pressure.  3. The mitral valve is normal in structure. Trivial mitral valve regurgitation. No evidence of mitral stenosis.  4. The aortic valve is tricuspid. Aortic valve regurgitation is not visualized. No aortic stenosis is present. Comparison(s): No prior Echocardiogram. Conclusion(s)/Recommendation(s): Normal biventricular function without evidence of hemodynamically significant valvular heart disease. No intracardiac source of embolism detected on this transthoracic study. Heather [name] transesophageal echocardiogram is recommended to exclude cardiac source of embolism if clinically indicated. FINDINGS  Left Ventricle: Left ventricular ejection fraction, by estimation, is 65 to 70%. The left ventricle has normal function. The left ventricle has no regional wall motion abnormalities. The left ventricular internal cavity size was normal in size. There is  no left ventricular hypertrophy. Left ventricular diastolic parameters are indeterminate. Right Ventricle: The right ventricular size is normal. No increase in right ventricular wall thickness. Right ventricular systolic function is normal. There is normal pulmonary artery systolic pressure. The tricuspid regurgitant velocity is 1.81 m/s, and  with an assumed right atrial pressure of 8 mmHg, the estimated right ventricular systolic pressure is 21.1 mmHg. Left Atrium: Left atrial size was normal in size. Right Atrium: Right atrial size was normal in size. Pericardium: There is no evidence of pericardial effusion. Presence of pericardial fat pad. Mitral Valve: The mitral valve is normal in structure. Trivial mitral valve  regurgitation. No evidence of mitral valve stenosis. Tricuspid Valve: The tricuspid valve is normal in structure. Tricuspid valve regurgitation is trivial. No evidence of tricuspid stenosis. Aortic Valve: The aortic valve is tricuspid. Aortic valve regurgitation is not visualized. No aortic stenosis is present. Pulmonic Valve: The pulmonic valve was not well visualized. Pulmonic valve regurgitation is not visualized. Aorta: The aortic root, ascending aorta and aortic arch are all structurally normal, with no evidence of dilitation or obstruction. Venous: The inferior vena cava was not well visualized. IAS/Shunts: No atrial level shunt detected by color flow Doppler.  LEFT VENTRICLE PLAX 2D LVIDd:         4.10 cm  Diastology LVIDs:         2.50 cm  LV [name]' medial:    3.94 cm/s LV PW:         1.00 cm  LV [name]/[name]' medial:  14.5 LV IVS:        1.00 cm  LV [name]' lateral:   4.56 cm/s LVOT diam:     1.90 cm  LV [name]/[name]' lateral: 12.5 LV SV:         42 LV SV Index:   26 LVOT Area:     2.84 cm  RIGHT VENTRICLE TAPSE (M-mode): 1.9 cm LEFT ATRIUM             Index       RIGHT ATRIUM  Index LA diam:        2.80 cm 1.75 cm/m  RA Area:     12.10 cm LA Vol (A2C):   28.7 ml 17.95 ml/m RA Volume:   26.00 ml  16.26 ml/m LA Vol (A4C):   29.2 ml 18.27 ml/m LA Biplane Vol: 30.3 ml 18.95 ml/m  AORTIC VALVE LVOT Vmax:   71.80 cm/s LVOT Vmean:  49.900 cm/s LVOT VTI:    0.148 m  AORTA Ao Root diam: 2.60 cm Ao Asc diam:  2.40 cm MITRAL VALVE               TRICUSPID VALVE MV Area (PHT): 2.73 cm    TR Peak grad:   13.1 mmHg MV Decel Time: 278 msec    TR Vmax:        181.00 cm/s MV [name] velocity: 57.00 cm/s MV Iasha Mccalister velocity: 71.10 cm/s  SHUNTS MV [name]/Kirk Sampley ratio:  0.80        Systemic VTI:  0.15 m                            Systemic Diam: 1.90 cm Heather [name] Red MD Electronically signed by Heather [name] Red MD Signature Date/Time: 01/18/2020/11:49:35 AM    Final         Scheduled Meds: .  stroke: mapping our early stages of recovery  book   Does not apply Once  . amLODipine  5 mg Oral Daily  . aspirin EC  81 mg Oral Daily  . clopidogrel  75 mg Oral Daily  . enoxaparin (LOVENOX) injection  30 mg Subcutaneous Q24H  . insulin aspart  0-5 Units Subcutaneous QHS  . insulin aspart  0-9 Units Subcutaneous TID WC  . polyethylene glycol  17 g Oral Daily  . rosuvastatin  40 mg Oral Daily   Continuous Infusions: .  ceFAZolin (ANCEF) IV 1 g (01/19/20 1629)     LOS: 2 days    Time spent: over 30 min    Lacretia Nicks, MD [facility]   To contact the attending provider between 7A-7P or the covering provider during after hours 7P-7A, please log into the web site [email or web address] and access using universal [place] password for that web site. If you do not have the password, please call the hospital operator.  01/19/2020, 8:11 PM

## 2020-01-20 LAB — COMPREHENSIVE METABOLIC PANEL
ALT: 17 U/L (ref 0–44)
AST: 26 U/L (ref 15–41)
Albumin: 3.3 g/dL — ABNORMAL LOW (ref 3.5–5.0)
Alkaline Phosphatase: 88 U/L (ref 38–126)
Anion gap: 11 (ref 5–15)
BUN: 16 mg/dL (ref 8–23)
CO2: 23 mmol/L (ref 22–32)
Calcium: 9.2 mg/dL (ref 8.9–10.3)
Chloride: 105 mmol/L (ref 98–111)
Creatinine, Ser: 1.22 mg/dL — ABNORMAL HIGH (ref 0.44–1.00)
GFR calc Af Amer: 49 mL/min — ABNORMAL LOW (ref >60)
GFR calc non Af Amer: 42 mL/min — ABNORMAL LOW (ref >60)
Glucose, Bld: 140 mg/dL — ABNORMAL HIGH (ref 70–99)
Potassium: 3.4 mmol/L — ABNORMAL LOW (ref 3.5–5.1)
Sodium: 139 mmol/L (ref 135–145)
Total Bilirubin: 0.6 mg/dL (ref 0.3–1.2)
Total Protein: 6.6 g/dL (ref 6.5–8.1)

## 2020-01-20 LAB — CBC WITH DIFFERENTIAL/PLATELET
Abs Immature Granulocytes: 0.03 10*3/uL (ref 0.00–0.07)
Basophils Absolute: 0 10*3/uL (ref 0.0–0.1)
Basophils Relative: 1 %
Eosinophils Absolute: 0.2 10*3/uL (ref 0.0–0.5)
Eosinophils Relative: 3 %
HCT: 46.1 % — ABNORMAL HIGH (ref 36.0–46.0)
Hemoglobin: 15.5 g/dL — ABNORMAL HIGH (ref 12.0–15.0)
Immature Granulocytes: 1 %
Lymphocytes Relative: 27 %
Lymphs Abs: 1.8 10*3/uL (ref 0.7–4.0)
MCH: 30.8 pg (ref 26.0–34.0)
MCHC: 33.6 g/dL (ref 30.0–36.0)
MCV: 91.7 fL (ref 80.0–100.0)
Monocytes Absolute: 0.7 10*3/uL (ref 0.1–1.0)
Monocytes Relative: 11 %
Neutro Abs: 3.9 10*3/uL (ref 1.7–7.7)
Neutrophils Relative %: 57 %
Platelets: 251 10*3/uL (ref 150–400)
RBC: 5.03 MIL/uL (ref 3.87–5.11)
RDW: 13.2 % (ref 11.5–15.5)
WBC: 6.7 10*3/uL (ref 4.0–10.5)
nRBC: 0 % (ref 0.0–0.2)

## 2020-01-20 LAB — GLUCOSE, CAPILLARY
Glucose-Capillary: 89 mg/dL (ref 70–99)
Glucose-Capillary: 193 mg/dL — ABNORMAL HIGH (ref 70–99)
Glucose-Capillary: 158 mg/dL — ABNORMAL HIGH (ref 70–99)
Glucose-Capillary: 107 mg/dL — ABNORMAL HIGH (ref 70–99)

## 2020-01-20 LAB — MAGNESIUM: Magnesium: 1.8 mg/dL (ref 1.7–2.4)

## 2020-01-20 LAB — PHOSPHORUS: Phosphorus: 3.5 mg/dL (ref 2.5–4.6)

## 2020-01-20 MED ORDER — HYDROXYZINE HCL 10 MG PO TABS: 10.0000 mg | ORAL_TABLET | Freq: Three times a day (TID) | ORAL | Status: DC | PRN

## 2020-01-20 NOTE — Progress Notes (Signed)
PROGRESS NOTE    Heather [name]  LSL:[id number] DOB: 1940-09-30 DOA: 01/17/2020 PCP: Patient, No Pcp Per    Brief Narrative:  79 y.o.femalewith medical history significant ofHTN, DM, HLD presenting with confusion.  Hx obtained from daughter due to the pt's mental status. Last normal Thursday or Friday. A little wobbly, but not far from normal. At baseline lives independently, has dog that she walks. No cane or walker. Does not drive, but cooks and cleans. Daughters notices progressive confusion over the weekend. Today, she said "dad didn't come home", but her husband passed away 10 years ago. Seems to have a flat affect, not normal emotions she usually has. No fevers, chills, CP, SOB, abdominal pain. She's not on many meds, but she d/c'd these after she stopped seeing her PCP. No smoking or etoh. She's vaccinated against COVID, received both doses of pfizer, last dose in April.   She was seen in the ED on the day prior to admission with negative head imaging.  Symptoms at that time were thought 2/2 UTI.    She was admitted with an acute stroke.  Plan now is for inpatient rehab when bed available (Monday in [facility]).   Assessment & Plan:   Active Problems:   Stroke [facility])  Acute Stroke MRI/MRA head/neck showing Acute Infarcts of the R Basal Ganglia and Adjacent White Matter as well as Small Infarcts of L internal Capsule and lentiform nucleus. 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 No hemodynamically significant stenosis at ICA origins. Neurology c/s, appreciate recommendations - recommending DAPT x 21 days followed by ASA alone, crestor 40 mg daily, pt will need cardiac event monitor at discharge to eval for atrial fibrillation Needs neurology referral at discharge A1c 6.4, LDL 239 -> started on crestor No need for additional permissive hypertension per neurology, will gradually normalize bp PT/OT/SLP -> recommending CIR, SW  pending Echo without intracardiac source of embolism, normal EF  Acute Toxic Metabolic Encephalopathy: A&Ox3.  Continues to improve.  Likely 2/2 above. Continue with delirium precautions Follow TSH (slightly elevated, follow outpatient), B12 (wnl), ammonia (wnl) Pt with UTI being treated as per below  Hypertension: no need for permissive hypertension at this point.  Will gradually lower BP.  Start with amlodipine 5 mg -> 10 mg today.  Continue with prn labetalol and cont to titrate bp as tolerated   T2DM: A1c 6.4, technically prediabetes range, but she reports prior hx of diabetes.  Not on any home meds, will start SSI and follow bg's.  Will consider metformin at discharge.  HLD:  LDL 239.  Started on crestor. She's had myalgias before with statins.  Will need coq10 at discharge.  Consider PCSK9 inhibitor if she can't tolerate crestor in future.  E. Coli UTI: cx with e. Coli sensitive to ancef (9/7 cx, in other chart - pt with 2 charts).  Will narrow to ancef. Anticipate total 7 days abx.    Hypokalemia: Potassium 3.4 today, will replace and follow  DVT prophylaxis: Lovenox subq Code Status: Full Family Communication: Pt in room, family not at bedside  Status is: Inpatient  Remains inpatient appropriate because:Unsafe d/c plan   Dispo: The patient is from: Home              Anticipated d/c is to: CIR              Anticipated d/c date is: 2 days  Patient currently is medically stable to d/c. just pending disposition   Consultants:   neurology  Procedures:   2d echo  Antimicrobials: Anti-infectives (From admission, onward)   Start     Dose/Rate Route Frequency Ordered Stop   01/19/20 0945  ceFAZolin (ANCEF) IVPB 1 g/50 mL premix        1 g 100 mL/hr over 30 Minutes Intravenous Every 8 hours 01/19/20 0938 01/24/20 0559   01/17/20 1900  cefTRIAXone (ROCEPHIN) 1 g in sodium chloride 0.9 % 100 mL IVPB  Status:  Discontinued        1 g 200 mL/hr over 30  Minutes Intravenous Every 24 hours 01/17/20 1847 01/19/20 0745       Subjective: Without complaints  Objective: Vitals:   01/19/20 2117 01/19/20 2349 01/20/20 0400 01/20/20 0807  BP: (!) 207/72 (!) 160/112 (!) 165/91 (!) 165/76  Pulse: 74 74 76 76  Resp: 18 18 16 17   Temp: 98 F (36.7 C) 98.4 F (36.9 C) 98.1 F (36.7 C) 98 F (36.7 C)  TempSrc: Oral Oral Oral   SpO2: 98% 98% 98% 99%  Weight:      Height:        Intake/Output Summary (Last 24 hours) at 01/20/2020 1352 Last data filed at 01/20/2020 0400 Gross per 24 hour  Intake 100 ml  Output --  Net 100 ml   Filed Weights   01/17/20 1611 01/18/20 0020  Weight: 68 kg 63 kg    Examination:  General exam: Appears calm and comfortable  Respiratory system: Clear to auscultation. Respiratory effort normal. Cardiovascular system: S1 & S2 heard, Regular Gastrointestinal system: Abdomen is nondistended, soft and nontender. No organomegaly or masses felt. Normal bowel sounds heard. Central nervous system: Alert and oriented. No focal neurological deficits. Extremities: Symmetric 5 x 5 power. Skin: No rashes, lesions  Psychiatry: Judgement and insight appear normal. Mood & affect appropriate.   Data Reviewed: I have personally reviewed following labs and imaging studies  CBC: Recent Labs  Lab 01/17/20 1226 01/17/20 1226 01/17/20 1232 01/17/20 1955 01/18/20 0228 01/19/20 0107 01/20/20 0120  WBC 8.6  --   --  7.0 6.4 6.1 6.7  NEUTROABS 6.5  --   --   --  4.1 3.8 3.9  HGB 15.3*   < > 15.3* 13.7 13.0 14.2 15.5*  HCT 47.0*   < > 45.0 41.9 39.7 43.5 46.1*  MCV 93.8  --   --  93.1 91.9 92.8 91.7  PLT 288  --   --  232 226 224 251   < > = values in this interval not displayed.   Basic Metabolic Panel: Recent Labs  Lab 01/17/20 1226 01/17/20 1226 01/17/20 1232 01/17/20 1955 01/18/20 0228 01/19/20 0107 01/20/20 0120  NA 141  --  143  --  143 139 139  K 3.6  --  3.7  --  3.0* 3.6 3.4*  CL 104  --  105  --  108  106 105  CO2 26  --   --   --  23 22 23   GLUCOSE 148*  --  142*  --  114* 102* 140*  BUN 20  --  22  --  17 18 16   CREATININE 1.62*   < > 1.60* 1.42* 1.31* 1.23* 1.22*  CALCIUM 9.6  --   --   --  8.8* 9.0 9.2  MG  --   --   --   --  1.9 1.7 1.8  PHOS  --   --   --   --  3.7 3.1 3.5   < > = values in this interval not displayed.   GFR: Estimated Creatinine Clearance: 31.5 mL/min (A) (by C-G formula based on SCr of 1.22 mg/dL (H)). Liver Function Tests: Recent Labs  Lab 01/17/20 1226 01/18/20 0228 01/19/20 0107 01/20/20 0120  AST 25 25 25 26   ALT 17 16 18 17   ALKPHOS 91 75 81 88  BILITOT 0.7 0.7 0.7 0.6  PROT 6.8 5.9* 6.4* 6.6  ALBUMIN 3.7 3.0* 3.3* 3.3*   No results for input(s): LIPASE, AMYLASE in the last 168 hours. Recent Labs  Lab 01/19/20 0107  AMMONIA 22   Coagulation Profile: Recent Labs  Lab 01/17/20 1226  INR 1.0   Cardiac Enzymes: No results for input(s): CKTOTAL, CKMB, CKMBINDEX, TROPONINI in the last 168 hours. BNP (last 3 results) No results for input(s): PROBNP in the last 8760 hours. HbA1C: Recent Labs    01/17/20 1828  HGBA1C 6.4*   CBG: Recent Labs  Lab 01/19/20 0801 01/19/20 1234 01/19/20 1620 01/19/20 2112 01/20/20 1209  GLUCAP 169* 179* 107* 176* 89   Lipid Profile: Recent Labs    01/18/20 0228  CHOL 298*  HDL 37*  LDLCALC 239*  TRIG 112  CHOLHDL 8.1   Thyroid Function Tests: Recent Labs    01/19/20 0107  TSH 5.309*   Anemia Panel: Recent Labs    01/19/20 0107  VITAMINB12 364   Sepsis Labs: No results for input(s): PROCALCITON, LATICACIDVEN in the last 168 hours.  Recent Results (from the past 240 hour(s))  SARS Coronavirus 2 by RT PCR (hospital order, performed in [facility] hospital lab) Nasopharyngeal Nasopharyngeal Swab     Status: None   Collection Time: 01/17/20  4:36 PM   Specimen: Nasopharyngeal Swab  Result Value Ref Range Status   SARS Coronavirus 2 NEGATIVE NEGATIVE Final    Comment:  (NOTE) SARS-CoV-2 target nucleic acids are NOT DETECTED.  The SARS-CoV-2 RNA is generally detectable in upper and lower respiratory specimens during the acute phase of infection. The lowest concentration of SARS-CoV-2 viral copies this assay can detect is 250 copies / mL. A negative result does not preclude SARS-CoV-2 infection and should not be used as the sole basis for treatment or other patient management decisions.  A negative result may occur with improper specimen collection / handling, submission of specimen other than nasopharyngeal swab, presence of viral mutation(s) within the areas targeted by this assay, and inadequate number of viral copies (<250 copies / mL). A negative result must be combined with clinical observations, patient history, and epidemiological information.  Fact Sheet for Patients:   BoilerBrush.com.cyhttps://www.fda.gov/media/136312/download  Fact Sheet for Healthcare Providers: https://pope.com/https://www.fda.gov/media/136313/download  This test is not yet approved or  cleared by the [place] FDA and has been authorized for detection and/or diagnosis of SARS-CoV-2 by FDA under an Emergency Use Authorization (EUA).  This EUA will remain in effect (meaning this test can be used) for the duration of the COVID-19 declaration under Section 564(b)(1) of the Act, 21 U.S.C. section 360bbb-3(b)(1), unless the authorization is terminated or revoked sooner.  Performed at [facility], [address] [place], [place] [address]      Radiology Studies: No results found.  Scheduled Meds: .  stroke: mapping our early stages of recovery book   Does not apply Once  . amLODipine  10 mg Oral Daily  . aspirin EC  81 mg Oral Daily  . clopidogrel  75 mg Oral Daily  . enoxaparin (LOVENOX) injection  30 mg Subcutaneous Q24H  . insulin aspart  0-5 Units Subcutaneous QHS  . insulin aspart  0-9 Units Subcutaneous TID WC  . polyethylene glycol  17 g Oral Daily  . rosuvastatin  40 mg Oral  Daily   Continuous Infusions: .  ceFAZolin (ANCEF) IV 1 g (01/20/20 0612)     LOS: 3 days   Rickey Barbara, MD [facility] Pager On Amion  If 7PM-7AM, please contact night-coverage 01/20/2020, 1:52 PM

## 2020-01-21 LAB — COMPREHENSIVE METABOLIC PANEL
ALT: 13 U/L (ref 0–44)
AST: 24 U/L (ref 15–41)
Albumin: 3 g/dL — ABNORMAL LOW (ref 3.5–5.0)
Alkaline Phosphatase: 82 U/L (ref 38–126)
Anion gap: 9 (ref 5–15)
BUN: 17 mg/dL (ref 8–23)
CO2: 27 mmol/L (ref 22–32)
Calcium: 9.4 mg/dL (ref 8.9–10.3)
Chloride: 102 mmol/L (ref 98–111)
Creatinine, Ser: 1.44 mg/dL — ABNORMAL HIGH (ref 0.44–1.00)
GFR calc Af Amer: 40 mL/min — ABNORMAL LOW (ref >60)
GFR calc non Af Amer: 35 mL/min — ABNORMAL LOW (ref >60)
Glucose, Bld: 128 mg/dL — ABNORMAL HIGH (ref 70–99)
Potassium: 3.7 mmol/L (ref 3.5–5.1)
Sodium: 138 mmol/L (ref 135–145)
Total Bilirubin: 0.6 mg/dL (ref 0.3–1.2)
Total Protein: 6.2 g/dL — ABNORMAL LOW (ref 6.5–8.1)

## 2020-01-21 LAB — GLUCOSE, CAPILLARY
Glucose-Capillary: 190 mg/dL — ABNORMAL HIGH (ref 70–99)
Glucose-Capillary: 141 mg/dL — ABNORMAL HIGH (ref 70–99)
Glucose-Capillary: 192 mg/dL — ABNORMAL HIGH (ref 70–99)
Glucose-Capillary: 236 mg/dL — ABNORMAL HIGH (ref 70–99)

## 2020-01-21 MED ORDER — METOPROLOL TARTRATE 25 MG PO TABS
25.0000 mg | ORAL_TABLET | Freq: Two times a day (BID) | ORAL | Status: DC
  Administered 2020-01-21 – 2020-01-22 (×2): 25 mg via ORAL
  Filled 2020-01-21 (×2): qty 1

## 2020-01-21 MED ORDER — HYDRALAZINE HCL 20 MG/ML IJ SOLN
5.0000 mg | INTRAMUSCULAR | Status: DC | PRN
  Filled 2020-01-21: qty 1

## 2020-01-21 MED ORDER — CEFAZOLIN SODIUM-DEXTROSE 1-4 GM/50ML-% IV SOLN
1.0000 g | Freq: Two times a day (BID) | INTRAVENOUS | Status: DC
  Administered 2020-01-21 – 2020-01-22 (×2): 1 g via INTRAVENOUS
  Filled 2020-01-21 (×3): qty 50

## 2020-01-21 NOTE — Progress Notes (Signed)
PROGRESS NOTE    Heather DodrillRuth E [name]  BJY:[id number]:[id number] DOB: [birth year] DOA: 01/17/2020 PCP: Patient, No Pcp Per    Brief Narrative:  79 y.o.femalewith medical history significant ofHTN, DM, HLD presenting with confusion.  Hx obtained from daughter due to the pt's mental status. Last normal Thursday or Friday. A little wobbly, but not far from normal. At baseline lives independently, has dog that she walks. No cane or walker. Does not drive, but cooks and cleans. Daughters notices progressive confusion over the weekend. Today, she said "dad didn't come home", but her husband passed away 10 years ago. Seems to have a flat affect, not normal emotions she usually has. No fevers, chills, CP, SOB, abdominal pain. She's not on many meds, but she d/c'd these after she stopped seeing her PCP. No smoking or etoh. She's vaccinated against COVID, received both doses of pfizer, last dose in April.   She was seen in the ED on the day prior to admission with negative head imaging.  Symptoms at that time were thought 2/2 UTI.    She was admitted with an acute stroke.  Plan now is for inpatient rehab when bed available (Monday [facility]).   Assessment & Plan:   Active Problems:   Stroke [facility](HCC)  Acute Stroke MRI/MRA head/neck showing Acute Infarcts of the R Basal Ganglia and Adjacent White Matter as well as Small Infarcts of L internal Capsule and lentiform nucleus. 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 No hemodynamically significant stenosis at ICA origins. Neurology c/s, appreciate recommendations - recommending DAPT x 21 days followed by ASA alone, crestor 40 mg daily, pt will need cardiac event monitor at discharge to eval for atrial fibrillation Needs neurology referral at discharge A1c 6.4, LDL 239 -> started on crestor No need for additional permissive hypertension per neurology, will gradually normalize bp PT/OT/SLP -> recommending CIR, SW  pending Echo noted  without intracardiac source of embolism, normal EF  Acute Toxic Metabolic Encephalopathy: A&Ox3.  Continues to improve.  Likely 2/2 above. Continue with delirium precautions Follow TSH (slightly elevated, follow outpatient), B12 (wnl), ammonia (wnl) Pt with UTI that is treated as per below  Hypertension: no need for permissive hypertension at this point.  Will gradually lower BP.  Continue10 mg norvasc.  Continue with prn labetalol and cont to titrate bp as tolerated as tolerated BP remains elevated. Have added metoprolol  T2DM: A1c 6.4, technically prediabetes range, but she reports prior hx of diabetes.  Not on any home meds, will start SSI and follow bg's.  Would consider metformin at discharge.  HLD:  LDL 239.  Started on crestor. She's had myalgias before with statins.  Will need coq10 at discharge.  Consider PCSK9 inhibitor if she can't tolerate crestor in future.  E. Coli UTI: cx with e. Coli sensitive to ancef (9/7 cx, in other chart - pt with 2 charts).  Will on ancef. Anticipate total 7 days abx.    Hypokalemia: Will repeat bmet in AM  DVT prophylaxis: Lovenox subq Code Status: Full Family Communication: Pt in room, family currently at bedside  Status is: Inpatient  Remains inpatient appropriate because:Unsafe d/c plan   Dispo: The patient is from: Home              Anticipated d/c is to: CIR              Anticipated d/c date is: 2 days  Patient currently is medically stable to d/c. just pending disposition   Consultants:   neurology  Procedures:   2d echo  Antimicrobials: Anti-infectives (From admission, onward)   Start     Dose/Rate Route Frequency Ordered Stop   01/21/20 2000  ceFAZolin (ANCEF) IVPB 1 g/50 mL premix        1 g 100 mL/hr over 30 Minutes Intravenous Every 12 hours 01/21/20 0933     01/19/20 0945  ceFAZolin (ANCEF) IVPB 1 g/50 mL premix  Status:  Discontinued        1 g 100 mL/hr over 30 Minutes  Intravenous Every 8 hours 01/19/20 0938 01/21/20 0933   01/17/20 1900  cefTRIAXone (ROCEPHIN) 1 g in sodium chloride 0.9 % 100 mL IVPB  Status:  Discontinued        1 g 200 mL/hr over 30 Minutes Intravenous Every 24 hours 01/17/20 1847 01/19/20 0745      Subjective: No complaints this AM  Objective: Vitals:   01/20/20 1602 01/20/20 1602 01/20/20 2116 01/21/20 0752  BP: (!) 176/86 (!) 176/86 (!) 167/86 (!) 170/78  Pulse: 95 95 92 100  Resp: 16 16 18 16   Temp: 98.4 F (36.9 C) 98.4 F (36.9 C) 99.1 F (37.3 C) 98.2 F (36.8 C)  TempSrc:   Oral   SpO2:  98% 93% 99%  Weight:      Height:        Intake/Output Summary (Last 24 hours) at 01/21/2020 1523 Last data filed at 01/21/2020 0517 Gross per 24 hour  Intake --  Output 1150 ml  Net -1150 ml   Filed Weights   01/17/20 1611 01/18/20 0020  Weight: 68 kg 63 kg    Examination: General exam: Awake, laying in bed, in nad Respiratory system: Normal respiratory effort, no wheezing Cardiovascular system: regular rate, s1, s2 Gastrointestinal system: Soft, nondistended, positive BS Central nervous system: CN2-12 grossly intact, strength intact Extremities: Perfused, no clubbing Skin: Normal skin turgor, no notable skin lesions seen Psychiatry: Mood normal // no visual hallucinations   Data Reviewed: I have personally reviewed following labs and imaging studies  CBC: Recent Labs  Lab 01/17/20 1226 01/17/20 1226 01/17/20 1232 01/17/20 1955 01/18/20 0228 01/19/20 0107 01/20/20 0120  WBC 8.6  --   --  7.0 6.4 6.1 6.7  NEUTROABS 6.5  --   --   --  4.1 3.8 3.9  HGB 15.3*   < > 15.3* 13.7 13.0 14.2 15.5*  HCT 47.0*   < > 45.0 41.9 39.7 43.5 46.1*  MCV 93.8  --   --  93.1 91.9 92.8 91.7  PLT 288  --   --  232 226 224 251   < > = values in this interval not displayed.   Basic Metabolic Panel: Recent Labs  Lab 01/17/20 1226 01/17/20 1226 01/17/20 1232 01/17/20 1232 01/17/20 1955 01/18/20 0228 01/19/20 0107  01/20/20 0120 01/21/20 0237  NA 141   < > 143  --   --  143 139 139 138  K 3.6   < > 3.7  --   --  3.0* 3.6 3.4* 3.7  CL 104   < > 105  --   --  108 106 105 102  CO2 26  --   --   --   --  23 22 23 27   GLUCOSE 148*   < > 142*  --   --  114* 102* 140* 128*  BUN 20   < > 22  --   --  17 18 16 17   CREATININE 1.62*   < > 1.60*   < > 1.42* 1.31* 1.23* 1.22* 1.44*  CALCIUM 9.6  --   --   --   --  8.8* 9.0 9.2 9.4  MG  --   --   --   --   --  1.9 1.7 1.8  --   PHOS  --   --   --   --   --  3.7 3.1 3.5  --    < > = values in this interval not displayed.   GFR: Estimated Creatinine Clearance: 26.7 mL/min (A) (by C-G formula based on SCr of 1.44 mg/dL (H)). Liver Function Tests: Recent Labs  Lab 01/17/20 1226 01/18/20 0228 01/19/20 0107 01/20/20 0120 01/21/20 0237  AST 25 25 25 26 24   ALT 17 16 18 17 13   ALKPHOS 91 75 81 88 82  BILITOT 0.7 0.7 0.7 0.6 0.6  PROT 6.8 5.9* 6.4* 6.6 6.2*  ALBUMIN 3.7 3.0* 3.3* 3.3* 3.0*   No results for input(s): LIPASE, AMYLASE in the last 168 hours. Recent Labs  Lab 01/19/20 0107  AMMONIA 22   Coagulation Profile: Recent Labs  Lab 01/17/20 1226  INR 1.0   Cardiac Enzymes: No results for input(s): CKTOTAL, CKMB, CKMBINDEX, TROPONINI in the last 168 hours. BNP (last 3 results) No results for input(s): PROBNP in the last 8760 hours. HbA1C: No results for input(s): HGBA1C in the last 72 hours. CBG: Recent Labs  Lab 01/20/20 1614 01/20/20 1716 01/20/20 2111 01/21/20 0751 01/21/20 1148  GLUCAP 193* 158* 107* 190* 141*   Lipid Profile: No results for input(s): CHOL, HDL, LDLCALC, TRIG, CHOLHDL, LDLDIRECT in the last 72 hours. Thyroid Function Tests: Recent Labs    01/19/20 0107  TSH 5.309*   Anemia Panel: Recent Labs    01/19/20 0107  VITAMINB12 364   Sepsis Labs: No results for input(s): PROCALCITON, LATICACIDVEN in the last 168 hours.  Recent Results (from the past 240 hour(s))  SARS Coronavirus 2 by RT PCR (hospital  order, performed in [facility] hospital lab) Nasopharyngeal Nasopharyngeal Swab     Status: None   Collection Time: 01/17/20  4:36 PM   Specimen: Nasopharyngeal Swab  Result Value Ref Range Status   SARS Coronavirus 2 NEGATIVE NEGATIVE Final    Comment: (NOTE) SARS-CoV-2 target nucleic acids are NOT DETECTED.  The SARS-CoV-2 RNA is generally detectable in upper and lower respiratory specimens during the acute phase of infection. The lowest concentration of SARS-CoV-2 viral copies this assay can detect is 250 copies / mL. A negative result does not preclude SARS-CoV-2 infection and should not be used as the sole basis for treatment or other patient management decisions.  A negative result may occur with improper specimen collection / handling, submission of specimen other than nasopharyngeal swab, presence of viral mutation(s) within the areas targeted by this assay, and inadequate number of viral copies (<250 copies / mL). A negative result must be combined with clinical observations, patient history, and epidemiological information.  Fact Sheet for Patients:   03/20/20  Fact Sheet for Healthcare Providers: [facility]  This test is not yet approved or  cleared by the 03/18/20 FDA and has been authorized for detection and/or diagnosis of SARS-CoV-2 by FDA under an Emergency Use Authorization (EUA).  This EUA will remain in effect (meaning this test can be used) for the duration of the COVID-19 declaration under Section 564(b)(1) of the Act, 21 U.S.C. section 360bbb-3(b)(1), unless the  authorization is terminated or revoked sooner.  Performed at [facility], [address] [place], [place] [address]      Radiology Studies: No results found.  Scheduled Meds: .  stroke: mapping our early stages of recovery book   Does not apply Once  . amLODipine  10 mg Oral Daily  . aspirin EC  81 mg Oral Daily  .  clopidogrel  75 mg Oral Daily  . enoxaparin (LOVENOX) injection  30 mg Subcutaneous Q24H  . insulin aspart  0-5 Units Subcutaneous QHS  . insulin aspart  0-9 Units Subcutaneous TID WC  . metoprolol tartrate  25 mg Oral BID  . polyethylene glycol  17 g Oral Daily  . rosuvastatin  40 mg Oral Daily   Continuous Infusions: .  ceFAZolin (ANCEF) IV       LOS: 4 days   Rickey Barbara, MD [facility] Pager On Amion  If 7PM-7AM, please contact night-coverage 01/21/2020, 3:23 PM

## 2020-01-21 NOTE — Progress Notes (Signed)
Pharmacy Antibiotic Note  Heather [name] is a 79 y.o. female admitted on 01/17/2020 with UTI.  Pharmacy has been consulted for Cefazolin dosing.    Patient initially with CrCL of 31 ml/min with elevated Scr. Today, Scr increased to 1.44 with CrCl of 26.7 ml/min. Remains afebrile at this time. Plan to reduce cefazolin 1g to q12h from q8h given renal function.   Plan: Decrease Cefazolin to 1 gram IV q12hr Monitor renal function and clinical status   Height: 5' (152.4 cm) Weight: 63 kg (138 lb 14.2 oz) IBW/kg (Calculated) : 45.5  Temp (24hrs), Avg:98.5 F (36.9 C), Min:98.2 F (36.8 C), Max:99.1 F (37.3 C)  Recent Labs  Lab 01/17/20 1226 01/17/20 1232 01/17/20 1955 01/18/20 0228 01/19/20 0107 01/20/20 0120 01/21/20 0237  WBC 8.6  --  7.0 6.4 6.1 6.7  --   CREATININE 1.62*   < > 1.42* 1.31* 1.23* 1.22* 1.44*   < > = values in this interval not displayed.    Estimated Creatinine Clearance: 26.7 mL/min (A) (by C-G formula based on SCr of 1.44 mg/dL (H)).    Allergies  Allergen Reactions  . Iohexol Other (See Comments)    Reaction not recalled    Antimicrobials this admission: Ceftriax  9/8>>9/9 Cefaz 9/10>>(9/14)  Thank you for allowing pharmacy to be a part of this patient's care.  Trixie Rude, PharmD PGY1 Acute Care Pharmacy Resident 01/21/2020 9:30 AM  Please check [email or web address] for unit-specific pharmacy phone numbers.

## 2020-01-22 LAB — GLUCOSE, CAPILLARY
Glucose-Capillary: 170 mg/dL — ABNORMAL HIGH (ref 70–99)
Glucose-Capillary: 196 mg/dL — ABNORMAL HIGH (ref 70–99)

## 2020-01-22 LAB — COMPREHENSIVE METABOLIC PANEL
ALT: 13 U/L (ref 0–44)
AST: 28 U/L (ref 15–41)
Albumin: 3 g/dL — ABNORMAL LOW (ref 3.5–5.0)
Alkaline Phosphatase: 93 U/L (ref 38–126)
Anion gap: 7 (ref 5–15)
BUN: 22 mg/dL (ref 8–23)
CO2: 28 mmol/L (ref 22–32)
Calcium: 9.2 mg/dL (ref 8.9–10.3)
Chloride: 104 mmol/L (ref 98–111)
Creatinine, Ser: 1.25 mg/dL — ABNORMAL HIGH (ref 0.44–1.00)
GFR calc Af Amer: 48 mL/min — ABNORMAL LOW (ref >60)
GFR calc non Af Amer: 41 mL/min — ABNORMAL LOW (ref >60)
Glucose, Bld: 117 mg/dL — ABNORMAL HIGH (ref 70–99)
Potassium: 3.5 mmol/L (ref 3.5–5.1)
Sodium: 139 mmol/L (ref 135–145)
Total Bilirubin: 0.4 mg/dL (ref 0.3–1.2)
Total Protein: 6.1 g/dL — ABNORMAL LOW (ref 6.5–8.1)

## 2020-01-22 LAB — SARS CORONAVIRUS 2 BY RT PCR (HOSPITAL ORDER, PERFORMED IN ~~LOC~~ HOSPITAL LAB): SARS Coronavirus 2: NEGATIVE

## 2020-01-22 MED ORDER — CLOPIDOGREL BISULFATE 75 MG PO TABS: 75.0000 mg | ORAL_TABLET | Freq: Every day | ORAL | 0 refills | Status: AC

## 2020-01-22 MED ORDER — CEPHALEXIN 500 MG PO CAPS: 500.0000 mg | ORAL_CAPSULE | Freq: Four times a day (QID) | ORAL | 0 refills | Status: AC

## 2020-01-22 MED ORDER — METFORMIN HCL 500 MG PO TABS: 500.0000 mg | ORAL_TABLET | Freq: Two times a day (BID) | ORAL | 0 refills | Status: DC

## 2020-01-22 MED ORDER — ROSUVASTATIN CALCIUM 40 MG PO TABS
40.0000 mg | ORAL_TABLET | Freq: Every day | ORAL | 0 refills | Status: DC
Start: 2020-01-23 — End: 2020-02-13

## 2020-01-22 MED ORDER — HYDROXYZINE HCL 10 MG PO TABS: 10.0000 mg | ORAL_TABLET | Freq: Three times a day (TID) | ORAL | 0 refills | Status: DC | PRN

## 2020-01-22 MED ORDER — AMLODIPINE BESYLATE 10 MG PO TABS
10.0000 mg | ORAL_TABLET | Freq: Every day | ORAL | 0 refills | Status: DC
Start: 2020-01-23 — End: 2020-03-05

## 2020-01-22 MED ORDER — ASPIRIN 81 MG PO TBEC: 81.0000 mg | DELAYED_RELEASE_TABLET | Freq: Every day | ORAL | 0 refills | Status: AC

## 2020-01-22 MED ORDER — METOPROLOL TARTRATE 25 MG PO TABS
25.0000 mg | ORAL_TABLET | Freq: Two times a day (BID) | ORAL | 0 refills | Status: DC
Start: 2020-01-22 — End: 2020-03-05

## 2020-01-22 NOTE — Progress Notes (Addendum)
   01/22/20 1210  Clinical Encounter Type  Visited With Patient  Visit Type Initial  Referral From Nurse  Consult/Referral To Chaplain  Visited with patient and her daughter requesting Advanced Directive education. Patient will decide on  which daughter will be her healthcare POA and will have nurse contact spiritual care office when she is ready. Daughter also stated patient suppose to be transferred to rehab facility in [facility] and request address. Advised I will let unit secretary know of her inquiry. Per Fabian November no notes on discharge. Patient's daughter was not in the room when I returned. This note was prepared by Deneen Harts, M.Div.  For questions please contact by phone [phone]

## 2020-01-22 NOTE — Discharge Summary (Signed)
Physician Discharge Summary  Heather [name] XQJ:[id number] DOB: [birth year] DOA: 01/17/2020  PCP: Patient, No Pcp Per  Admit date: 01/17/2020 Discharge date: 01/22/2020  Admitted From: Home Disposition:  CIR  Recommendations for Outpatient Follow-up:  1. Follow up with PCP in 1-2 weeks 2. Follow up with Neurology as scheduled 3. Follow up with Cardiology for outpatient event monitor as scheuled  Discharge Condition:Stable CODE STATUS:Full Diet recommendation: heart healthy, diabetic  Brief/Interim Summary: 79 y.o.femalewith medical history significant ofHTN, DM, HLD presenting with confusion.  Initial hx obtained from daughter due to the pt's mental status. Last normal Thursday or Friday. A little wobbly, but not far from normal. At baseline lives independently, has dog that she walks. No cane or walker. Does not drive, but cooks and cleans. Daughters notices progressive confusion over the weekend. Today, she said "dad didn't come home", but her husband passed away 10 years ago. Seems to have a flat affect, not normal emotions she usually has. No fevers, chills, CP, SOB, abdominal pain. She's not on many meds, but she d/c'd these after she stopped seeing her PCP. No smoking or etoh. She's vaccinated against COVID, received both doses of pfizer, last dose in April.   She was seen in the ED on the day prior to admission with negative head imaging. Symptoms at that time were thought 2/2 UTI.   Discharge Diagnoses:  Active Problems:   Stroke [facility])  Acute Stroke MRI/MRA head/neck showing Acute Infarcts of the R Basal Ganglia and Adjacent White Matter as well as Small Infarcts of L internal Capsule and lentiform nucleus. 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 No hemodynamically significant stenosis at ICA origins. Neurology c/s, appreciate recommendations - recommending DAPT x 21 days (finish plavix on 9/30) followed by ASA alone,  crestor 40 mg daily, pt will need cardiac event monitor at discharge to eval for atrial fibrillation Needs neurology referral at discharge A1c 6.4, LDL 239 -> started on crestor No need for additional permissive hypertension per neurology, will gradually normalize bp PT/OT/SLP -> recommending CIR, SW pending Echo noted  without intracardiac source of embolism, normal EF  Acute Toxic Metabolic Encephalopathy: A&Ox3.Continues to improve. Likely 2/2 above. Continue with delirium precautions Follow TSH(slightly elevated, follow outpatient), B12(wnl), ammonia(wnl) Pt with UTI that is treated as per below  Hypertension: no need for permissive hypertension at this point. Will gradually lower BP. Continue10 mg norvasc. Have added metoprolol. Continue to titrate BP meds with goal of normotension  T2DM: A1c 6.4, technically prediabetes range, but she reports prior hx of diabetes.Not on any home meds, will start SSI and follow bg's. Will prescribe metformin at discharge.  HLD: LDL 239. Started on crestor. She's had myalgias before with statins. Will need coq10 at discharge. Consider PCSK9 inhibitor if she can't tolerate crestor in future.  E. Coli UTI: cx with e. Coli sensitive to ancef (9/7 cx, in other chart- pt with 2 charts). Will on ancef. Complete 2 more days of keflex on d/c  Discharge Instructions  Discharge Instructions    Ambulatory referral to Neurology   Complete by: As directed    Follow up with stroke clinic NP (Jessica Vanschaick or Darrol Angel, if both not available, consider Manson Allan, or Ahern) at [facility] in about 4 weeks. Thanks.     Allergies as of 01/22/2020      Reactions   Iohexol Other (See Comments)   Reaction not recalled      Medication List    TAKE these  medications   amLODipine 10 MG tablet Commonly known as: NORVASC Take 1 tablet (10 mg total) by mouth daily. Start taking on: January 23, 2020   aspirin 81 MG EC tablet Take 1  tablet (81 mg total) by mouth daily. Swallow whole. Start taking on: January 23, 2020 What changed:   medication strength  how much to take  when to take this  additional instructions   cephALEXin 500 MG capsule Commonly known as: KEFLEX Take 1 capsule (500 mg total) by mouth 4 (four) times daily for 10 days. What changed: You were already taking a medication with the same name, and this prescription was added. Make sure you understand how and when to take each.   cephALEXin 500 MG capsule Commonly known as: KEFLEX Take 1 capsule (500 mg total) by mouth 4 (four) times daily for 2 days. FOR 7 DAYS What changed: Another medication with the same name was added. Make sure you understand how and when to take each.   clopidogrel 75 MG tablet Commonly known as: PLAVIX Take 1 tablet (75 mg total) by mouth daily for 17 days.   hydrOXYzine 10 MG tablet Commonly known as: ATARAX/VISTARIL Take 1 tablet (10 mg total) by mouth 3 (three) times daily as needed for anxiety.   metFORMIN 500 MG tablet Commonly known as: GLUCOPHAGE Take 1 tablet (500 mg total) by mouth 2 (two) times daily with a meal.   metoprolol tartrate 25 MG tablet Commonly known as: LOPRESSOR Take 1 tablet (25 mg total) by mouth 2 (two) times daily.   rosuvastatin 40 MG tablet Commonly known as: CRESTOR Take 1 tablet (40 mg total) by mouth daily. Start taking on: January 23, 2020       Follow-up Information    [facility]. Schedule an appointment as soon as possible for a visit in 4 week(s).   Specialty: Neurology Contact information: [address] [place] [address] [phone]       Follow up with PCP in 1-2 weeks Follow up.        Follow up with Cardiology as scheduled for outpatient event monitor Follow up.              Allergies  Allergen Reactions  . Iohexol Other (See Comments)    Reaction not recalled     Consultations:  Neurology  Procedures/Studies: CT HEAD WO CONTRAST  Result Date: 01/17/2020 CLINICAL DATA:  Stroke, follow-up EXAM: CT HEAD WITHOUT CONTRAST TECHNIQUE: Contiguous axial images were obtained from the base of the skull through the vertex without intravenous contrast. COMPARISON:  01/16/2020 FINDINGS: Brain: There is no acute intracranial hemorrhage or mass effect. Hypoattenuation is again identified at the right caudate and lentiform nucleus. No new loss of gray-white differentiation. Additional patchy and confluent areas of hypoattenuation in the supratentorial white matter are nonspecific but probably reflect stable chronic microvascular ischemic changes. Ventricles are stable in caliber. Vascular: There is atherosclerotic calcification at the skull base. Skull: Calvarium is unremarkable. Sinuses/Orbits: No acute finding. Other: None. IMPRESSION: Evolving acute to subacute right basal ganglia infarct. No acute intracranial hemorrhage. Electronically Signed   By: Guadlupe Spanish M.D.   On: 01/17/2020 14:12   MR ANGIO HEAD WO CONTRAST  Result Date: 01/17/2020 CLINICAL DATA:  Confusion and gait difficulty EXAM: MRI HEAD WITHOUT CONTRAST MRA HEAD WITHOUT CONTRAST MRA NECK WITHOUT CONTRAST TECHNIQUE: Multiplanar, multiecho pulse sequences of the brain and surrounding structures were obtained without intravenous contrast. Angiographic images of the Circle of Willis  were obtained using MRA technique without intravenous contrast. Angiographic images of the neck were obtained using MRA technique without intravenous contrast. Carotid stenosis measurements (when applicable) are obtained utilizing NASCET criteria, using the distal internal carotid diameter as the denominator. COMPARISON:  Recent CT imaging FINDINGS: MRI HEAD Brain: There is reduced diffusion involving the right caudate and lentiform nuclei and adjacent white matter as seen on CT. Additionally, there are small foci of reduced  diffusion contralaterally involving the internal capsule and lentiform nucleus. No evidence of intracranial hemorrhage. No significant mass effect. Additional patchy and confluent areas of T2 hyperintensity in the supratentorial and pontine white matter are nonspecific but probably reflect moderate to advanced chronic microvascular ischemic changes. There is no hydrocephalus. No extra-axial fluid collection. No intracranial mass. Vascular: Loss of the expected flow void of the visualized left vertebral artery. Major vessel flow voids at the skull base are otherwise preserved. Skull and upper cervical spine: Normal marrow signal is preserved. Sinuses/Orbits: Paranasal sinuses are aerated. Orbits are unremarkable. Other: Sella is unremarkable.  Mastoid air cells are clear. MRA HEAD Intracranial internal carotid arteries are patent with atherosclerotic irregularity. Middle and anterior cerebral arteries are patent. Right A1 ACA is dominant. Left A1 ACA is hypoplastic or absent. Intracranial right vertebral artery is patent. There is no flow related enhancement of the intracranial left vertebral artery. There is a patent left AICA/PICA. Basilar artery is patent with mild stenosis proximally. Posterior cerebral arteries are patent. Bilateral posterior communicating arteries are present and are the predominant supply of the posterior cerebral arteries. MRA NECK Common, internal, and external carotid arteries are patent. There is atherosclerotic plaque at the ICA origins with minimal stenosis. Extracranial right vertebral artery is patent. There is no flow related enhancement of the left vertebral artery. 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 Additional small acute infarcts of the left internal capsule and lentiform nucleus. 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 No  hemodynamically significant stenosis at the ICA origins. Electronically Signed   By: Guadlupe Spanish M.D.   On: 01/17/2020 20:09   MR ANGIO NECK WO CONTRAST  Result Date: 01/17/2020 CLINICAL DATA:  Confusion and gait difficulty EXAM: MRI HEAD WITHOUT CONTRAST MRA HEAD WITHOUT CONTRAST MRA NECK WITHOUT CONTRAST TECHNIQUE: Multiplanar, multiecho pulse sequences of the brain and surrounding structures were obtained without intravenous contrast. Angiographic images of the Circle of Willis were obtained using MRA technique without intravenous contrast. Angiographic images of the neck were obtained using MRA technique without intravenous contrast. Carotid stenosis measurements (when applicable) are obtained utilizing NASCET criteria, using the distal internal carotid diameter as the denominator. COMPARISON:  Recent CT imaging FINDINGS: MRI HEAD Brain: There is reduced diffusion involving the right caudate and lentiform nuclei and adjacent white matter as seen on CT. Additionally, there are small foci of reduced diffusion contralaterally involving the internal capsule and lentiform nucleus. No evidence of intracranial hemorrhage. No significant mass effect. Additional patchy and confluent areas of T2 hyperintensity in the supratentorial and pontine white matter are nonspecific but probably reflect moderate to advanced chronic microvascular ischemic changes. There is no hydrocephalus. No extra-axial fluid collection. No intracranial mass. Vascular: Loss of the expected flow void of the visualized left vertebral artery. Major vessel flow voids at the skull base are otherwise preserved. Skull and upper cervical spine: Normal marrow signal is preserved. Sinuses/Orbits: Paranasal sinuses are aerated. Orbits are unremarkable. Other: Sella is  unremarkable.  Mastoid air cells are clear. MRA HEAD Intracranial internal carotid arteries are patent with atherosclerotic irregularity. Middle and anterior cerebral arteries are patent.  Right A1 ACA is dominant. Left A1 ACA is hypoplastic or absent. Intracranial right vertebral artery is patent. There is no flow related enhancement of the intracranial left vertebral artery. There is a patent left AICA/PICA. Basilar artery is patent with mild stenosis proximally. Posterior cerebral arteries are patent. Bilateral posterior communicating arteries are present and are the predominant supply of the posterior cerebral arteries. MRA NECK Common, internal, and external carotid arteries are patent. There is atherosclerotic plaque at the ICA origins with minimal stenosis. Extracranial right vertebral artery is patent. There is no flow related enhancement of the left vertebral artery. 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 Additional small acute infarcts of the left internal capsule and lentiform nucleus. 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 No hemodynamically significant stenosis at the ICA origins. Electronically Signed   By: Guadlupe SpanishPraneil  Patel M.D.   On: 01/17/2020 20:09   MR BRAIN WO CONTRAST  Result Date: 01/17/2020 CLINICAL DATA:  Confusion and gait difficulty EXAM: MRI HEAD WITHOUT CONTRAST MRA HEAD WITHOUT CONTRAST MRA NECK WITHOUT CONTRAST TECHNIQUE: Multiplanar, multiecho pulse sequences of the brain and surrounding structures were obtained without intravenous contrast. Angiographic images of the Circle of Willis were obtained using MRA technique without intravenous contrast. Angiographic images of the neck were obtained using MRA technique without intravenous contrast. Carotid stenosis measurements (when applicable) are obtained utilizing NASCET criteria, using the distal internal carotid diameter as the denominator. COMPARISON:  Recent CT imaging FINDINGS: MRI HEAD Brain: There is reduced diffusion involving the right caudate and lentiform nuclei  and adjacent white matter as seen on CT. Additionally, there are small foci of reduced diffusion contralaterally involving the internal capsule and lentiform nucleus. No evidence of intracranial hemorrhage. No significant mass effect. Additional patchy and confluent areas of T2 hyperintensity in the supratentorial and pontine white matter are nonspecific but probably reflect moderate to advanced chronic microvascular ischemic changes. There is no hydrocephalus. No extra-axial fluid collection. No intracranial mass. Vascular: Loss of the expected flow void of the visualized left vertebral artery. Major vessel flow voids at the skull base are otherwise preserved. Skull and upper cervical spine: Normal marrow signal is preserved. Sinuses/Orbits: Paranasal sinuses are aerated. Orbits are unremarkable. Other: Sella is unremarkable.  Mastoid air cells are clear. MRA HEAD Intracranial internal carotid arteries are patent with atherosclerotic irregularity. Middle and anterior cerebral arteries are patent. Right A1 ACA is dominant. Left A1 ACA is hypoplastic or absent. Intracranial right vertebral artery is patent. There is no flow related enhancement of the intracranial left vertebral artery. There is a patent left AICA/PICA. Basilar artery is patent with mild stenosis proximally. Posterior cerebral arteries are patent. Bilateral posterior communicating arteries are present and are the predominant supply of the posterior cerebral arteries. MRA NECK Common, internal, and external carotid arteries are patent. There is atherosclerotic plaque at the ICA origins with minimal stenosis. Extracranial right vertebral artery is patent. There is no flow related enhancement of the left vertebral artery. 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 Additional small acute infarcts of the left internal capsule and lentiform nucleus. 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 Likely  chronically occluded left vertebral artery. Mild proximal basilar artery stenosis. No hemodynamically significant stenosis at the  ICA origins. Electronically Signed   By: Guadlupe Spanish M.D.   On: 01/17/2020 20:09   ECHOCARDIOGRAM COMPLETE  Result Date: 01/18/2020    ECHOCARDIOGRAM REPORT   Patient Name:   Heather [name] [facility] Date of Exam: 01/18/2020 Medical Rec #:  [id number]      Height:       60.0 in Accession #:    [id number]     Weight:       138.9 lb Date of Birth:  [birth year]      BSA:          1.599 m Patient Age:    79 years       BP:           160/92 mmHg Patient Gender: F              HR:           61 bpm. Exam Location:  Inpatient Procedure: 2D Echo, Cardiac Doppler and Color Doppler Indications:    Stroke 434.91 / I163.9  History:        Patient has no prior history of Echocardiogram examinations.                 Risk Factors:Hypertension.  Sonographer:    Eulah Pont RDCS Referring Phys: [phone] A CALDWELL POWELL JR IMPRESSIONS  1. Left ventricular ejection fraction, by estimation, is 65 to 70%. The left ventricle has normal function. The left ventricle has no regional wall motion abnormalities. Left ventricular diastolic parameters are indeterminate.  2. Right ventricular systolic function is normal. The right ventricular size is normal. There is normal pulmonary artery systolic pressure.  3. The mitral valve is normal in structure. Trivial mitral valve regurgitation. No evidence of mitral stenosis.  4. The aortic valve is tricuspid. Aortic valve regurgitation is not visualized. No aortic stenosis is present. Comparison(s): No prior Echocardiogram. Conclusion(s)/Recommendation(s): Normal biventricular function without evidence of hemodynamically significant valvular heart disease. No intracardiac source of embolism detected on this transthoracic study. A transesophageal echocardiogram is recommended to exclude cardiac source of embolism if clinically indicated. FINDINGS  Left Ventricle: Left ventricular  ejection fraction, by estimation, is 65 to 70%. The left ventricle has normal function. The left ventricle has no regional wall motion abnormalities. The left ventricular internal cavity size was normal in size. There is  no left ventricular hypertrophy. Left ventricular diastolic parameters are indeterminate. Right Ventricle: The right ventricular size is normal. No increase in right ventricular wall thickness. Right ventricular systolic function is normal. There is normal pulmonary artery systolic pressure. The tricuspid regurgitant velocity is 1.81 m/s, and  with an assumed right atrial pressure of 8 mmHg, the estimated right ventricular systolic pressure is 21.1 mmHg. Left Atrium: Left atrial size was normal in size. Right Atrium: Right atrial size was normal in size. Pericardium: There is no evidence of pericardial effusion. Presence of pericardial fat pad. Mitral Valve: The mitral valve is normal in structure. Trivial mitral valve regurgitation. No evidence of mitral valve stenosis. Tricuspid Valve: The tricuspid valve is normal in structure. Tricuspid valve regurgitation is trivial. No evidence of tricuspid stenosis. Aortic Valve: The aortic valve is tricuspid. Aortic valve regurgitation is not visualized. No aortic stenosis is present. Pulmonic Valve: The pulmonic valve was not well visualized. Pulmonic valve regurgitation is not visualized. Aorta: The aortic root, ascending aorta and aortic arch are all structurally normal, with no evidence of dilitation or obstruction. Venous: The inferior vena cava was not well visualized. IAS/Shunts: No atrial  level shunt detected by color flow Doppler.  LEFT VENTRICLE PLAX 2D LVIDd:         4.10 cm  Diastology LVIDs:         2.50 cm  LV e' medial:    3.94 cm/s LV PW:         1.00 cm  LV E/e' medial:  14.5 LV IVS:        1.00 cm  LV e' lateral:   4.56 cm/s LVOT diam:     1.90 cm  LV E/e' lateral: 12.5 LV SV:         42 LV SV Index:   26 LVOT Area:     2.84 cm  RIGHT  VENTRICLE TAPSE (M-mode): 1.9 cm LEFT ATRIUM             Index       RIGHT ATRIUM           Index LA diam:        2.80 cm 1.75 cm/m  RA Area:     12.10 cm LA Vol (A2C):   28.7 ml 17.95 ml/m RA Volume:   26.00 ml  16.26 ml/m LA Vol (A4C):   29.2 ml 18.27 ml/m LA Biplane Vol: 30.3 ml 18.95 ml/m  AORTIC VALVE LVOT Vmax:   71.80 cm/s LVOT Vmean:  49.900 cm/s LVOT VTI:    0.148 m  AORTA Ao Root diam: 2.60 cm Ao Asc diam:  2.40 cm MITRAL VALVE               TRICUSPID VALVE MV Area (PHT): 2.73 cm    TR Peak grad:   13.1 mmHg MV Decel Time: 278 msec    TR Vmax:        181.00 cm/s MV E velocity: 57.00 cm/s MV A velocity: 71.10 cm/s  SHUNTS MV E/A ratio:  0.80        Systemic VTI:  0.15 m                            Systemic Diam: 1.90 cm Jodelle Red MD Electronically signed by Jodelle Red MD Signature Date/Time: 01/18/2020/11:49:35 AM    Final      Subjective: Without complaints  Discharge Exam: Vitals:   01/22/20 0721 01/22/20 0737  BP: (!) 153/74 (!) 179/84  Pulse: 83 79  Resp: 18 19  Temp: 98.6 F (37 C) 98.7 F (37.1 C)  SpO2: 97% 99%   Vitals:   01/21/20 2027 01/22/20 0100 01/22/20 0721 01/22/20 0737  BP: (!) 131/113  (!) 153/74 (!) 179/84  Pulse: (!) 110  83 79  Resp:  Temp: 99 F (37.2 C)  98.6 F (37 C) 98.7 F (37.1 C)  TempSrc: Oral     SpO2: 98%  97% 99%  Weight:      Height:        General: Pt is alert, awake, not in acute distress Cardiovascular: RRR, S1/S2 +, no rubs, no gallops Respiratory: CTA bilaterally, no wheezing, no rhonchi Abdominal: Soft, NT, ND, bowel sounds + Extremities: no edema, no cyanosis   The results of significant diagnostics from this hospitalization (including imaging, microbiology, ancillary and laboratory) are listed below for reference.     Microbiology: Recent Results (from the past 240 hour(s))  SARS Coronavirus 2 by RT PCR (hospital order, performed in [facility] hospital lab) Nasopharyngeal  Nasopharyngeal Swab     Status: None  Collection Time: 01/17/20  4:36 PM   Specimen: Nasopharyngeal Swab  Result Value Ref Range Status   SARS Coronavirus 2 NEGATIVE NEGATIVE Final    Comment: (NOTE) SARS-CoV-2 target nucleic acids are NOT DETECTED.  The SARS-CoV-2 RNA is generally detectable in upper and lower respiratory specimens during the acute phase of infection. The lowest concentration of SARS-CoV-2 viral copies this assay can detect is 250 copies / mL. A negative result does not preclude SARS-CoV-2 infection and should not be used as the sole basis for treatment or other patient management decisions.  A negative result may occur with improper specimen collection / handling, submission of specimen other than nasopharyngeal swab, presence of viral mutation(s) within the areas targeted by this assay, and inadequate number of viral copies (<250 copies / mL). A negative result must be combined with clinical observations, patient history, and epidemiological information.  Fact Sheet for Patients:   BoilerBrush.com.cy  Fact Sheet for Healthcare Providers: https://pope.com/  This test is not yet approved or  cleared by the [place] FDA and has been authorized for detection and/or diagnosis of SARS-CoV-2 by FDA under an Emergency Use Authorization (EUA).  This EUA will remain in effect (meaning this test can be used) for the duration of the COVID-19 declaration under Section 564(b)(1) of the Act, 21 U.S.C. section 360bbb-3(b)(1), unless the authorization is terminated or revoked sooner.  Performed at [facility], [address] [place], [place] [address]   SARS Coronavirus 2 by RT PCR (hospital order, performed in [facility] hospital lab) Nasopharyngeal Nasopharyngeal Swab     Status: None   Collection Time: 01/22/20  9:51 AM   Specimen: Nasopharyngeal Swab  Result Value Ref Range Status   SARS Coronavirus 2  NEGATIVE NEGATIVE Final    Comment: (NOTE) SARS-CoV-2 target nucleic acids are NOT DETECTED.  The SARS-CoV-2 RNA is generally detectable in upper and lower respiratory specimens during the acute phase of infection. The lowest concentration of SARS-CoV-2 viral copies this assay can detect is 250 copies / mL. A negative result does not preclude SARS-CoV-2 infection and should not be used as the sole basis for treatment or other patient management decisions.  A negative result may occur with improper specimen collection / handling, submission of specimen other than nasopharyngeal swab, presence of viral mutation(s) within the areas targeted by this assay, and inadequate number of viral copies (<250 copies / mL). A negative result must be combined with clinical observations, patient history, and epidemiological information.  Fact Sheet for Patients:   BoilerBrush.com.cy  Fact Sheet for Healthcare Providers: https://pope.com/  This test is not yet approved or  cleared by the [place] FDA and has been authorized for detection and/or diagnosis of SARS-CoV-2 by FDA under an Emergency Use Authorization (EUA).  This EUA will remain in effect (meaning this test can be used) for the duration of the COVID-19 declaration under Section 564(b)(1) of the Act, 21 U.S.C. section 360bbb-3(b)(1), unless the authorization is terminated or revoked sooner.  Performed at [facility], [address], [place], [place] [address]      Labs: BNP (last 3 results) No results for input(s): BNP in the last 8760 hours. Basic Metabolic Panel: Recent Labs  Lab 01/18/20 0228 01/19/20 0107 01/20/20 0120 01/21/20 0237 01/22/20 0112  NA 143 139 139 138 139  K 3.0* 3.6 3.4* 3.7 3.5  CL 108 106 105 102 104  CO2 23 22 23 27 28   GLUCOSE 114* 102* 140* 128* 117*  BUN CREATININE 1.31* 1.23* 1.22* 1.44* 1.25*  CALCIUM 8.8* 9.0 9.2 9.4  9.2  MG 1.9 1.7 1.8  --   --   PHOS 3.7 3.1 3.5  --   --    Liver Function Tests: Recent Labs  Lab 01/18/20 0228 01/19/20 0107 01/20/20 0120 01/21/20 0237 01/22/20 0112  AST ALT ALKPHOS 75 81 88 82 93  BILITOT 0.7 0.7 0.6 0.6 0.4  PROT 5.9* 6.4* 6.6 6.2* 6.1*  ALBUMIN 3.0* 3.3* 3.3* 3.0* 3.0*   No results for input(s): LIPASE, AMYLASE in the last 168 hours. Recent Labs  Lab 01/19/20 0107  AMMONIA 22   CBC: Recent Labs  Lab 01/17/20 1226 01/17/20 1226 01/17/20 1232 01/17/20 1955 01/18/20 0228 01/19/20 0107 01/20/20 0120  WBC 8.6  --   --  7.0 6.4 6.1 6.7  NEUTROABS 6.5  --   --   --  4.1 3.8 3.9  HGB 15.3*   < > 15.3* 13.7 13.0 14.2 15.5*  HCT 47.0*   < > 45.0 41.9 39.7 43.5 46.1*  MCV 93.8  --   --  93.1 91.9 92.8 91.7  PLT 288  --   --  232 226 224 251   < > = values in this interval not displayed.   Cardiac Enzymes: No results for input(s): CKTOTAL, CKMB, CKMBINDEX, TROPONINI in the last 168 hours. BNP: Invalid input(s): POCBNP CBG: Recent Labs  Lab 01/21/20 1148 01/21/20 1552 01/21/20 2024 01/22/20 0736 01/22/20 1135  GLUCAP 141* 192* 236* 170* 196*   D-Dimer No results for input(s): DDIMER in the last 72 hours. Hgb A1c No results for input(s): HGBA1C in the last 72 hours. Lipid Profile No results for input(s): CHOL, HDL, LDLCALC, TRIG, CHOLHDL, LDLDIRECT in the last 72 hours. Thyroid function studies No results for input(s): TSH, T4TOTAL, T3FREE, THYROIDAB in the last 72 hours.  Invalid input(s): FREET3 Anemia work up No results for input(s): VITAMINB12, FOLATE, FERRITIN, TIBC, IRON, RETICCTPCT in the last 72 hours. Urinalysis No results found for: COLORURINE, APPEARANCEUR, LABSPEC, PHURINE, GLUCOSEU, HGBUR, BILIRUBINUR, KETONESUR, PROTEINUR, UROBILINOGEN, NITRITE, LEUKOCYTESUR Sepsis Labs Invalid input(s): PROCALCITONIN,  WBC,  LACTICIDVEN Microbiology Recent Results (from the past 240 hour(s))  SARS  Coronavirus 2 by RT PCR (hospital order, performed in [facility] hospital lab) Nasopharyngeal Nasopharyngeal Swab     Status: None   Collection Time: 01/17/20  4:36 PM   Specimen: Nasopharyngeal Swab  Result Value Ref Range Status   SARS Coronavirus 2 NEGATIVE NEGATIVE Final    Comment: (NOTE) SARS-CoV-2 target nucleic acids are NOT DETECTED.  The SARS-CoV-2 RNA is generally detectable in upper and lower respiratory specimens during the acute phase of infection. The lowest concentration of SARS-CoV-2 viral copies this assay can detect is 250 copies / mL. A negative result does not preclude SARS-CoV-2 infection and should not be used as the sole basis for treatment or other patient management decisions.  A negative result may occur with improper specimen collection / handling, submission of specimen other than nasopharyngeal swab, presence of viral mutation(s) within the areas targeted by this assay, and inadequate number of viral copies (<250 copies / mL). A negative result must be combined with clinical observations, patient history, and epidemiological information.  Fact Sheet for Patients:   BoilerBrush.com.cy  Fact Sheet for Healthcare Providers: https://pope.com/  This test is not yet approved or  cleared by the [place] FDA  and has been authorized for detection and/or diagnosis of SARS-CoV-2 by FDA under an Emergency Use Authorization (EUA).  This EUA will remain in effect (meaning this test can be used) for the duration of the COVID-19 declaration under Section 564(b)(1) of the Act, 21 U.S.C. section 360bbb-3(b)(1), unless the authorization is terminated or revoked sooner.  Performed at [facility], [address] [place], [place] [address]   SARS Coronavirus 2 by RT PCR (hospital order, performed in [facility] hospital lab) Nasopharyngeal Nasopharyngeal Swab     Status: None   Collection Time: 01/22/20  9:51  AM   Specimen: Nasopharyngeal Swab  Result Value Ref Range Status   SARS Coronavirus 2 NEGATIVE NEGATIVE Final    Comment: (NOTE) SARS-CoV-2 target nucleic acids are NOT DETECTED.  The SARS-CoV-2 RNA is generally detectable in upper and lower respiratory specimens during the acute phase of infection. The lowest concentration of SARS-CoV-2 viral copies this assay can detect is 250 copies / mL. A negative result does not preclude SARS-CoV-2 infection and should not be used as the sole basis for treatment or other patient management decisions.  A negative result may occur with improper specimen collection / handling, submission of specimen other than nasopharyngeal swab, presence of viral mutation(s) within the areas targeted by this assay, and inadequate number of viral copies (<250 copies / mL). A negative result must be combined with clinical observations, patient history, and epidemiological information.  Fact Sheet for Patients:   BoilerBrush.com.cy  Fact Sheet for Healthcare Providers: https://pope.com/  This test is not yet approved or  cleared by the [place] FDA and has been authorized for detection and/or diagnosis of SARS-CoV-2 by FDA under an Emergency Use Authorization (EUA).  This EUA will remain in effect (meaning this test can be used) for the duration of the COVID-19 declaration under Section 564(b)(1) of the Act, 21 U.S.C. section 360bbb-3(b)(1), unless the authorization is terminated or revoked sooner.  Performed at [facility], [address], [place], [place] [address]    Time spent: 30 min  SIGNED:   Rickey Barbara, MD  [facility] 01/22/2020, 2:04 PM  If 7PM-7AM, please contact night-coverage

## 2020-01-22 NOTE — TOC Transition Note (Signed)
Transition of Care [facility]) - CM/SW Discharge Note   Patient Details  Name: TAQUANA BARTLEY MRN: [id number] Date of Birth: 1940/05/12  [facility] CM/SW Contact:  Lorri Frederick, LCSW Phone Number: 01/22/2020, 1:41 PM   Clinical Narrative:   Pt to transfer to Rehab Center at [facility].  CSW spoke with Brunilda Payor and Elita Quick at [facility].  Negative covid test faxed.  PTAR called [phone].  RN call report to [phone].    Final next level of care: IP Rehab Facility ([facility]) Barriers to Discharge: Barriers Resolved   Patient Goals and CMS Choice Patient states their goals for this hospitalization and ongoing recovery are:: get better, not be dependent CMS Medicare.gov Compare Post Acute Care list provided to:: Patient Represenative (must comment) (daughter) Choice offered to / list presented to : Adult Children  Discharge Placement                Patient to be transferred to facility by: PTAR Name of family member notified: Amy, daughter Patient and family notified of of transfer: 01/22/20  Discharge Plan and Services     Post Acute Care Choice: IP Rehab          DME Arranged: N/A         HH Arranged: NA          Social Determinants of Health (SDOH) Interventions     Readmission Risk Interventions No flowsheet data found.

## 2020-01-22 NOTE — Progress Notes (Signed)
Report Called to [place] at [facility].

## 2020-01-23 ENCOUNTER — Encounter (HOSPITAL_BASED_OUTPATIENT_CLINIC_OR_DEPARTMENT_OTHER): Payer: Self-pay | Admitting: *Deleted

## 2020-02-06 ENCOUNTER — Other Ambulatory Visit: Payer: Self-pay | Admitting: *Deleted

## 2020-02-06 ENCOUNTER — Encounter: Payer: Self-pay | Admitting: *Deleted

## 2020-02-06 NOTE — Patient Outreach (Signed)
[facility]) Care Management  02/06/2020  Heather [name] [birth year] [id number]   RED ON EMMI ALERT - Stroke Day # 13 Date: 9/27 Red Alert Reason: Didn't go to follow up and Unsure if follow up is scheduled   Outreach attempt #1, successful.  Identity verified.  This care manager introduced self and stated purpose of call.  [facility] care management services explained.  She places daughter on phone to assist with assessment of needs.  Daughter report member wasn't discharged directly home, went to [facility] for rehab, sent home from rehab yesterday.    Social: Member report she was living on her own until she was hospitalized.  She is now living with her daughter Heather [name]. Heather [name] has taken this week off to help get member settled into the home.  Will go back to work next week, member's other children and grandchildren will provide assistance.  Heather [name] state member will not be left alone, one of the grandchildren is a Engineer, civil (consulting).    Member is requiring assistance with bathing and dressing.  She uses a walker, will have home health PT/OT starting today through [facility].  Conditions: Has history of HTN, diabetes, stroke, and new diagnosis of dementia.  Medications: Reviewed with daughter.  Some medications listed in chart does not correlate with medications in the home.  She also state that some of the meds are too expensive for member to afford.  She has been discussing having member apply for Medicare Part D to help with med cost.  She is open to having pharmacy team do reconciliation and review for financial assistance.  Appointments: New PCP appointment with [facility] on 10/5 and with neurology on 10/13.  Daughter will provide transportation.    Advance Directives: Daughter report discussion has been held with member regarding POA and advanced Directives however she is not ready to complete paperwork.  Encouraged to consider and notify this care manager when ready to  proceed.   Plan: RN CM will send member EMMI information regarding stroke recovery.  Will place [facility] pharmacy referral.  Will follow up within the next 2 weeks.  Goals Addressed            This Visit's Progress   . [facility] - Keep or Improve My Strength       Follow Up Date 03/07/20   - arrange in-home help services - increase activity or exercise time a little every week - know who to call for help if I fall    Why is this important?   Before the stroke you probably did not think much about being safe when you are up and about.  Now, it may be harder for you to get around.  It may also be easier for you to trip or fall.  It is common to have muscle weakness after a stroke. You may also feel like you cannot control an arm or leg.  It will be helpful to work with a physical therapist to get your strength and muscle control back.  It is good to stay as active as you can. Walking and stretching help you stay strong and flexible.  The physical therapist will develop an exercise program just for you.     Notes: Confirmed patient will have PT and family with her throughout the day    . [facility] - Make and Keep All Appointments       Follow Up Date within the next 2 weeks   - ask  family or friend for a ride - call to cancel if needed - keep a calendar with appointment dates    Why is this important?   Part of staying healthy is seeing the doctor for follow-up care.  If you forget your appointments, there are some things you can do to stay on track.    Notes: New PCP appointment and Neurology scheduled      Kemper Durie, RN, MSN [facility] Care Management  [facility] Manager [phone]

## 2020-02-07 NOTE — Patient Outreach (Signed)
Referral from Kemper Durie, RN [facility] for Medication assistance.

## 2020-02-13 ENCOUNTER — Telehealth: Payer: Self-pay | Admitting: *Deleted

## 2020-02-13 ENCOUNTER — Encounter: Payer: Self-pay | Admitting: Family

## 2020-02-13 ENCOUNTER — Ambulatory Visit (INDEPENDENT_AMBULATORY_CARE_PROVIDER_SITE_OTHER): Payer: Medicare Other | Admitting: Family

## 2020-02-13 ENCOUNTER — Other Ambulatory Visit: Payer: Self-pay

## 2020-02-13 VITALS — BP 136/80 | HR 57 | Temp 97.1°F | Resp 16 | Ht 60.0 in | Wt 140.6 lb

## 2020-02-13 DIAGNOSIS — Z8673 Personal history of transient ischemic attack (TIA), and cerebral infarction without residual deficits: Secondary | ICD-10-CM

## 2020-02-13 DIAGNOSIS — I10 Essential (primary) hypertension: Secondary | ICD-10-CM

## 2020-02-13 DIAGNOSIS — R600 Localized edema: Secondary | ICD-10-CM

## 2020-02-13 DIAGNOSIS — E119 Type 2 diabetes mellitus without complications: Secondary | ICD-10-CM | POA: Diagnosis not present

## 2020-02-13 DIAGNOSIS — I639 Cerebral infarction, unspecified: Secondary | ICD-10-CM

## 2020-02-13 DIAGNOSIS — E785 Hyperlipidemia, unspecified: Secondary | ICD-10-CM

## 2020-02-13 DIAGNOSIS — R2681 Unsteadiness on feet: Secondary | ICD-10-CM

## 2020-02-13 DIAGNOSIS — Z1159 Encounter for screening for other viral diseases: Secondary | ICD-10-CM

## 2020-02-13 NOTE — Telephone Encounter (Signed)
Peter with [facility] called requesting verbal orders for PT. 2x2wks and 1x6wks.   Verbal order ok (patient established today) Please Advise.

## 2020-02-13 NOTE — Progress Notes (Signed)
Provider: Marlowe Sax FNP-C   Gian Ybarra, Nelda Bucks, NP  Patient Care Team: Arcangel Minion, Nelda Bucks, NP as PCP - General (Family Medicine) System, Provider Not In Valente David, RN as [place] [facility]  Extended Emergency Contact Information Primary Emergency Contact: [place] Phone: [phone] Relation: Daughter Secondary Emergency Contact: [place] Mobile Phone: [phone] Relation: Daughter  Code Status:  Full Code  Goals of care: Advanced Directive information Advanced Directives 02/13/2020  Does Patient Have a Medical Advance Directive? No  Would patient like information on creating a medical advance directive? No - Patient declined     Chief Complaint  Patient presents with   Establish Care    New Patient.    HPI:  Pt is a 79 y.o. 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 She was treated for UTI suspected confusion was due to UTI.No leukocytosis.I gm of rocephin was given and continued on Keflex.wellness referral given for a PCP.she was also advised to follow up with Kidney specialist for slight elevation of her Creatinine.Head CT scan was without any acute findings but showed evidence of an old lacunar infarct. She was advised to go to [facility] for an MRI if no improvement in 2 days.Her condition was stable and was discharge home. She was admitted from 01/17/2020 - 01/22/2020 at [facility]  for worsening confusion after she told her daughter that " didn't come home" but husband pased away 10 yrs ago.she had a flat affect unlike her usually self.MRI of head/neck showed acute infacts of the right basal ganglia and adjacent white matter as wel as small infarcts of internal capsule and lentform nucleus with chronically occluded L vertebral artery.mild proximal basilar artery stenosis.Neurology was consulted recommended DAPT x 21 days to finish plavix on 9/30 followed by ASA alone,crestor 40 mg daily.Cardiac event monitor at discharge to evaluate for atrial Afib.Also referred to outpatient Neurology at discharge.ECHO done was without intracardiac source of embolism.EF was normal.  Hypertension neuro recommended gradually lower B/p permissive hypertension. Type 2 DM - no CBG log for review latest Hgb A1C 6.4 not on any medication.Metoformin prescribed at discharge.   Hyperlipidemia - Crestor was initiated.she had mylagias before.PCSK9 to be considered if unable to tolerate statin.  Daughter states patient upset because she had to move from her apartment to live with daughter.she states would lime to have her computer from her apartment.Has a tablet that she can use in the meantime. She declines feeling depressed.No facial drooping,slurred speech does have some numbness on left hand and leg.walks with a walker.has Physical therapy that continues to work with her.   She completed her antibiotics Cefuroxime 500 mg tablet twice daily.     Past Medical History:  Diagnosis Date   Diabetes mellitus without complication (HCC)    High cholesterol    History of stroke 2021   Hypertension    History reviewed. No pertinent surgical history.  Allergies  Allergen Reactions   Iohexol      Desc:  Shaking and sore throat, Onset Date: [id number]    Iohexol Other (See Comments)    Reaction not recalled    Allergies as of 02/13/2020      Reactions   Iohexol     Desc: Shaking and sore throat,  Onset Date: [id number]   Iohexol Other (See Comments)   Reaction not recalled      Medication List       Accurate as of February 13, 2020  2:31 PM. If you have any questions, ask your nurse or doctor.        STOP taking these medications   cephALEXin 500 MG capsule Commonly known as: KEFLEX Stopped by: Sandrea Hughs, NP   fenofibrate 160 MG tablet Stopped by: Sandrea Hughs, NP   hydrOXYzine 10 MG tablet Commonly known as: ATARAX/VISTARIL Stopped by: Sandrea Hughs, NP   losartan-hydrochlorothiazide 100-12.5 MG tablet Commonly known as: HYZAAR Stopped by: Sandrea Hughs, NP   predniSONE 10 MG tablet Commonly known as: DELTASONE Stopped by: Sandrea Hughs, NP   rosuvastatin 40 MG tablet Commonly known as: CRESTOR Stopped by: Sandrea Hughs, NP     TAKE these medications   amLODipine 10 MG tablet Commonly known as: NORVASC Take 1 tablet (10 mg total) by mouth daily. What changed: Another medication with the same name was removed. Continue taking this medication, and follow the directions you see here. Changed by: Sandrea Hughs, NP   aspirin 81 MG EC tablet Take 1 tablet (81 mg total) by mouth daily. Swallow whole.   atorvastatin 80 MG tablet Commonly known as: LIPITOR Take 80 mg by mouth daily.   cefUROXime 500 MG tablet Commonly known as: CEFTIN Take 500 mg by mouth 2 (two) times daily with a meal.   clopidogrel 75 MG tablet Commonly known as: PLAVIX Take 75 mg by mouth daily.   LOSARTAN POTASSIUM PO Take 75 mg by mouth daily.   metFORMIN 500 MG tablet Commonly known as: GLUCOPHAGE Take 500 mg by mouth daily. What changed: Another medication with the same name was removed. Continue taking this medication, and follow the directions you see here. Changed by: Sandrea Hughs, NP   metoprolol tartrate 25 MG tablet Commonly known as: LOPRESSOR Take 1 tablet (25 mg total) by mouth 2 (two) times daily.       Review of Systems  Constitutional:  Negative for appetite change, chills, fatigue and fever.  HENT: Negative for congestion, hearing loss, postnasal drip, rhinorrhea, sinus pressure, sinus pain, sneezing, sore throat and trouble swallowing.        Seasonal allergies   Eyes: Positive for visual disturbance. Negative for pain, discharge and itching.  Respiratory: Negative for cough, chest tightness, shortness of breath and wheezing.   Cardiovascular: Negative for chest pain, palpitations and leg swelling.  Gastrointestinal: Negative for abdominal distention, abdominal pain, blood in stool, constipation, diarrhea, nausea and vomiting.  Endocrine: Negative for cold intolerance, heat intolerance, polydipsia, polyphagia and polyuria.  Genitourinary: Negative for difficulty urinating, dysuria, flank pain, frequency, urgency, vaginal bleeding and vaginal discharge.  Musculoskeletal: Positive for gait problem. Negative for arthralgias, back pain, joint swelling and myalgias.  Skin: Negative for color change, pallor and rash.  Neurological: Positive for numbness. Negative for dizziness, seizures, speech difficulty, weakness, light-headedness and headaches.  Hematological: Does not bruise/bleed easily.  Psychiatric/Behavioral: Negative for agitation, behavioral problems, hallucinations and sleep disturbance. The patient is not nervous/anxious.     Immunization History  Administered Date(s) Administered   Influenza-Unspecified  05/12/2019   PFIZER SARS-COV-2 Vaccination 07/25/2019, 08/17/2019   Pertinent  Health Maintenance Due  Topic Date Due   DEXA SCAN  Never done   PNA vac Low Risk Adult (1 of 2 - PCV13) Never done   INFLUENZA VACCINE  12/10/2019   Fall Risk  02/13/2020  Falls in the past year? 1  Number falls in past yr: 0  Injury with Fall? 0    Vitals:   02/13/20 1327  BP: 136/80  Pulse: (!) 57  Resp: 16  Temp: (!) 97.1 F (36.2 C)  SpO2: 97%  Weight: 140 lb 9.6 oz (63.8 kg)  Height: 5' (1.524 m)   Body mass  index is 27.46 kg/m. Physical Exam Vitals reviewed.  Constitutional:      General: She is not in acute distress.    Appearance: She is overweight.  HENT:     Head: Normocephalic.     Right Ear: Tympanic membrane, ear canal and external ear normal. There is no impacted cerumen.     Left Ear: Tympanic membrane, ear canal and external ear normal. There is no impacted cerumen.     Nose: Nose normal. No congestion or rhinorrhea.     Mouth/Throat:     Mouth: Mucous membranes are moist.     Pharynx: Oropharynx is clear. No oropharyngeal exudate or posterior oropharyngeal erythema.  Eyes:     General: No scleral icterus.       Right eye: No discharge.        Left eye: No discharge.     Extraocular Movements: Extraocular movements intact.     Conjunctiva/sclera: Conjunctivae normal.     Pupils: Pupils are equal, round, and reactive to light.  Neck:     Vascular: No carotid bruit.  Cardiovascular:     Rate and Rhythm: Normal rate and regular rhythm.     Pulses: Normal pulses.     Heart sounds: Normal heart sounds. No murmur heard.  No friction rub. No gallop.   Pulmonary:     Effort: Pulmonary effort is normal. No respiratory distress.     Breath sounds: Normal breath sounds. No wheezing, rhonchi or rales.  Chest:     Chest wall: No tenderness.  Abdominal:     General: Bowel sounds are normal. There is no distension.     Palpations: Abdomen is soft. There is no mass.     Tenderness: There is no abdominal tenderness. There is no right CVA tenderness, left CVA tenderness, guarding or rebound.  Musculoskeletal:        General: No swelling or tenderness. Normal range of motion.     Cervical back: Normal range of motion. No rigidity or tenderness.     Right lower leg: Edema present.     Left lower leg: Edema present.  Lymphadenopathy:     Cervical: No cervical adenopathy.  Skin:    General: Skin is warm and dry.     Coloration: Skin is not pale.     Findings: No bruising, erythema or  rash.  Neurological:     Mental Status: She is alert.     Sensory: No sensory deficit.     Motor: Weakness present.     Coordination: Coordination normal.     Gait: Gait abnormal.  Psychiatric:        Mood and Affect: Mood is depressed.        Speech: Speech normal.        Behavior: Behavior normal.  Thought Content: Thought content normal.        Cognition and Memory: Memory is impaired.        Judgment: Judgment normal.    Labs reviewed: Recent Labs    01/18/20 0228 01/18/20 0228 01/19/20 0107 01/19/20 0107 01/20/20 0120 01/21/20 0237 01/22/20 0112  NA 143   < > 139   < > 139 138 139  K 3.0*   < > 3.6   < > 3.4* 3.7 3.5  CL 108   < > 106   < > 105 102 104  CO2 23   < > 22   < > _0 GLUCOSE 114*   < > 102*   < > 140* 128* 117*  BUN 17   < > 18   < > _1 CREATININE 1.31*   < > 1.23*   < > 1.22* 1.44* 1.25*  CALCIUM 8.8*   < > 9.0   < > 9.2 9.4 9.2  MG 1.9  --  1.7  --  1.8  --   --   PHOS 3.7  --  3.1  --  3.5  --   --    < > = values in this interval not displayed.   Recent Labs    01/20/20 0120 01/21/20 0237 01/22/20 0112  AST _2 ALT _3 ALKPHOS 88 82 93  BILITOT 0.6 0.6 0.4  PROT 6.6 6.2* 6.1*  ALBUMIN 3.3* 3.0* 3.0*   Recent Labs    01/18/20 0228 01/19/20 0107 01/20/20 0120  WBC 6.4 6.1 6.7  NEUTROABS 4.1 3.8 3.9  HGB 13.0 14.2 15.5*  HCT 39.7 43.5 46.1*  MCV 91.9 92.8 91.7  PLT 226 224 251   Lab Results  Component Value Date   TSH 5.309 (H) 01/19/2020   Lab Results  Component Value Date   HGBA1C 6.4 (H) 01/17/2020   Lab Results  Component Value Date   CHOL 298 (H) 01/18/2020   HDL 37 (L) 01/18/2020   LDLCALC 239 (H) 01/18/2020   TRIG 112 01/18/2020   CHOLHDL 8.1 01/18/2020    Significant Diagnostic Results in last 30 days:  CT HEAD WO CONTRAST  Result Date: 01/17/2020 CLINICAL DATA:  Stroke, follow-up EXAM: CT HEAD WITHOUT CONTRAST TECHNIQUE: Contiguous axial images were obtained from the base of the  skull through the vertex without intravenous contrast. COMPARISON:  01/16/2020 FINDINGS: Brain: There is no acute intracranial hemorrhage or mass effect. Hypoattenuation is again identified at the right caudate and lentiform nucleus. No new loss of gray-white differentiation. Additional patchy and confluent areas of hypoattenuation in the supratentorial white matter are nonspecific but probably reflect stable chronic microvascular ischemic changes. Ventricles are stable in caliber. Vascular: There is atherosclerotic calcification at the skull base. Skull: Calvarium is unremarkable. Sinuses/Orbits: No acute finding. Other: None. IMPRESSION: Evolving acute to subacute right basal ganglia infarct. No acute intracranial hemorrhage. Electronically Signed   By: Macy Mis M.D.   On: 01/17/2020 14:12   CT Head Wo Contrast  Result Date: 01/16/2020 CLINICAL DATA:  Mental status changes EXAM: CT HEAD WITHOUT CONTRAST TECHNIQUE: Contiguous axial images were obtained from the base of the skull through the vertex without intravenous contrast. COMPARISON:  None. FINDINGS: Brain: Low-density seen within the right basal ganglia compatible with lacunar infarct. This could be acute or subacute. Chronic small vessel disease changes throughout the deep white matter. No hemorrhage or hydrocephalus. Vascular: No hyperdense vessel or unexpected  calcification. Skull: No acute calvarial abnormality. Sinuses/Orbits: Visualized paranasal sinuses and mastoids clear. Orbital soft tissues unremarkable. Other: None IMPRESSION: Low-density in the right basal ganglia compatible with age indeterminate lacunar infarct. Cannot exclude acute or subacute infarct. 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 Electronically Signed   By: Charlett Nose M.D.   On: 01/16/2020 20:15   MR ANGIO HEAD WO CONTRAST  Result Date: 01/17/2020 CLINICAL DATA:  Confusion and gait difficulty EXAM: MRI HEAD WITHOUT CONTRAST MRA HEAD WITHOUT  CONTRAST MRA NECK WITHOUT CONTRAST TECHNIQUE: Multiplanar, multiecho pulse sequences of the brain and surrounding structures were obtained without intravenous contrast. Angiographic images of the Circle of Willis were obtained using MRA technique without intravenous contrast. Angiographic images of the neck were obtained using MRA technique without intravenous contrast. Carotid stenosis measurements (when applicable) are obtained utilizing NASCET criteria, using the distal internal carotid diameter as the denominator. COMPARISON:  Recent CT imaging FINDINGS: MRI HEAD Brain: There is reduced diffusion involving the right caudate and lentiform nuclei and adjacent white matter as seen on CT. Additionally, there are small foci of reduced diffusion contralaterally involving the internal capsule and lentiform nucleus. No evidence of intracranial hemorrhage. No significant mass effect. Additional patchy and confluent areas of T2 hyperintensity in the supratentorial and pontine white matter are nonspecific but probably reflect moderate to advanced chronic microvascular ischemic changes. There is no hydrocephalus. No extra-axial fluid collection. No intracranial mass. Vascular: Loss of the expected flow void of the visualized left vertebral artery. Major vessel flow voids at the skull base are otherwise preserved. Skull and upper cervical spine: Normal marrow signal is preserved. Sinuses/Orbits: Paranasal sinuses are aerated. Orbits are unremarkable. Other: Sella is unremarkable.  Mastoid air cells are clear. MRA HEAD Intracranial internal carotid arteries are patent with atherosclerotic irregularity. Middle and anterior cerebral arteries are patent. Right A1 ACA is dominant. Left A1 ACA is hypoplastic or absent. Intracranial right vertebral artery is patent. There is no flow related enhancement of the intracranial left vertebral artery. There is a patent left AICA/PICA. Basilar artery is patent with mild stenosis  proximally. Posterior cerebral arteries are patent. Bilateral posterior communicating arteries are present and are the predominant supply of the posterior cerebral arteries. MRA NECK Common, internal, and external carotid arteries are patent. There is atherosclerotic plaque at the ICA origins with minimal stenosis. Extracranial right vertebral artery is patent. There is no flow related enhancement of the left vertebral artery. 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 Additional small acute infarcts of the left internal capsule and lentiform nucleus. 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 No hemodynamically significant stenosis at the ICA origins. Electronically Signed   By: Guadlupe Spanish M.D.   On: 01/17/2020 20:09   MR ANGIO NECK WO CONTRAST  Result Date: 01/17/2020 CLINICAL DATA:  Confusion and gait difficulty EXAM: MRI HEAD WITHOUT CONTRAST MRA HEAD WITHOUT CONTRAST MRA NECK WITHOUT CONTRAST TECHNIQUE: Multiplanar, multiecho pulse sequences of the brain and surrounding structures were obtained without intravenous contrast. Angiographic images of the Circle of Willis were obtained using MRA technique without intravenous contrast. Angiographic images of the neck were obtained using MRA technique without intravenous contrast. Carotid stenosis measurements (when applicable) are obtained utilizing NASCET criteria, using the distal internal carotid diameter as the denominator. COMPARISON:  Recent CT imaging FINDINGS: MRI HEAD Brain: There is reduced diffusion involving the right caudate and lentiform nuclei and  adjacent white matter as seen on CT. Additionally, there are small foci of reduced diffusion contralaterally involving the internal capsule and lentiform nucleus. No evidence of intracranial hemorrhage. No significant mass effect. Additional patchy and  confluent areas of T2 hyperintensity in the supratentorial and pontine white matter are nonspecific but probably reflect moderate to advanced chronic microvascular ischemic changes. There is no hydrocephalus. No extra-axial fluid collection. No intracranial mass. Vascular: Loss of the expected flow void of the visualized left vertebral artery. Major vessel flow voids at the skull base are otherwise preserved. Skull and upper cervical spine: Normal marrow signal is preserved. Sinuses/Orbits: Paranasal sinuses are aerated. Orbits are unremarkable. Other: Sella is unremarkable.  Mastoid air cells are clear. MRA HEAD Intracranial internal carotid arteries are patent with atherosclerotic irregularity. Middle and anterior cerebral arteries are patent. Right A1 ACA is dominant. Left A1 ACA is hypoplastic or absent. Intracranial right vertebral artery is patent. There is no flow related enhancement of the intracranial left vertebral artery. There is a patent left AICA/PICA. Basilar artery is patent with mild stenosis proximally. Posterior cerebral arteries are patent. Bilateral posterior communicating arteries are present and are the predominant supply of the posterior cerebral arteries. MRA NECK Common, internal, and external carotid arteries are patent. There is atherosclerotic plaque at the ICA origins with minimal stenosis. Extracranial right vertebral artery is patent. There is no flow related enhancement of the left vertebral artery. 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 Additional small acute infarcts of the left internal capsule and lentiform nucleus. 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 No hemodynamically significant stenosis at the ICA origins. Electronically Signed   By: Guadlupe Spanish M.D.   On: 01/17/2020 20:09   MR BRAIN WO CONTRAST  Result Date:  01/17/2020 CLINICAL DATA:  Confusion and gait difficulty EXAM: MRI HEAD WITHOUT CONTRAST MRA HEAD WITHOUT CONTRAST MRA NECK WITHOUT CONTRAST TECHNIQUE: Multiplanar, multiecho pulse sequences of the brain and surrounding structures were obtained without intravenous contrast. Angiographic images of the Circle of Willis were obtained using MRA technique without intravenous contrast. Angiographic images of the neck were obtained using MRA technique without intravenous contrast. Carotid stenosis measurements (when applicable) are obtained utilizing NASCET criteria, using the distal internal carotid diameter as the denominator. COMPARISON:  Recent CT imaging FINDINGS: MRI HEAD Brain: There is reduced diffusion involving the right caudate and lentiform nuclei and adjacent white matter as seen on CT. Additionally, there are small foci of reduced diffusion contralaterally involving the internal capsule and lentiform nucleus. No evidence of intracranial hemorrhage. No significant mass effect. Additional patchy and confluent areas of T2 hyperintensity in the supratentorial and pontine white matter are nonspecific but probably reflect moderate to advanced chronic microvascular ischemic changes. There is no hydrocephalus. No extra-axial fluid collection. No intracranial mass. Vascular: Loss of the expected flow void of the visualized left vertebral artery. Major vessel flow voids at the skull base are otherwise preserved. Skull and upper cervical spine: Normal marrow signal is preserved. Sinuses/Orbits: Paranasal sinuses are aerated. Orbits are unremarkable. Other: Sella is unremarkable.  Mastoid air cells are clear. MRA HEAD Intracranial internal carotid arteries are patent with atherosclerotic irregularity. Middle and anterior cerebral arteries are patent. Right A1 ACA is dominant. Left A1 ACA is hypoplastic or absent. Intracranial right vertebral artery is patent. There is no flow related enhancement of the intracranial left  vertebral artery. There is a patent left AICA/PICA.  Basilar artery is patent with mild stenosis proximally. Posterior cerebral arteries are patent. Bilateral posterior communicating arteries are present and are the predominant supply of the posterior cerebral arteries. MRA NECK Common, internal, and external carotid arteries are patent. There is atherosclerotic plaque at the ICA origins with minimal stenosis. Extracranial right vertebral artery is patent. There is no flow related enhancement of the left vertebral artery. 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 Additional small acute infarcts of the left internal capsule and lentiform nucleus. 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 No hemodynamically significant stenosis at the ICA origins. Electronically Signed   By: Macy Mis M.D.   On: 01/17/2020 20:09   DG Chest Port 1 View  Result Date: 01/16/2020 CLINICAL DATA:  Altered level of consciousness EXAM: PORTABLE CHEST 1 VIEW COMPARISON:  05/07/2013 FINDINGS: Single frontal view of the chest demonstrates a stable cardiac silhouette. Ectasias of the thoracic aorta, with mild diffuse atherosclerosis. Continued calcified pleural plaques. No airspace disease, effusion, or pneumothorax. IMPRESSION: 1. No acute intrathoracic process. Electronically Signed   By: Randa Ngo M.D.   On: 01/16/2020 20:25   ECHOCARDIOGRAM COMPLETE  Result Date: 01/18/2020    ECHOCARDIOGRAM REPORT   Patient Name:   ROSELANI GRAJEDA [facility] Date of Exam: 01/18/2020 Medical Rec #:  [id number]      Height:       60.0 in Accession #:    [id number]     Weight:       138.9 lb Date of Birth:  06/11/1940      BSA:          1.599 m Patient Age:    57 years       BP:           160/92 mmHg Patient Gender: F              HR:           61 bpm. Exam Location:  Inpatient Procedure: 2D Echo, Cardiac Doppler  and Color Doppler Indications:    Stroke 434.91 / I163.9  History:        Patient has no prior history of Echocardiogram examinations.                 Risk Factors:Hypertension.  Sonographer:    Bernadene Person RDCS Referring Phys: [id number] A CALDWELL [place]  1. Left ventricular ejection fraction, by estimation, is 65 to 70%. The left ventricle has normal function. The left ventricle has no regional wall motion abnormalities. Left ventricular diastolic parameters are indeterminate.  2. Right ventricular systolic function is normal. The right ventricular size is normal. There is normal pulmonary artery systolic pressure.  3. The mitral valve is normal in structure. Trivial mitral valve regurgitation. No evidence of mitral stenosis.  4. The aortic valve is tricuspid. Aortic valve regurgitation is not visualized. No aortic stenosis is present. Comparison(s): No prior Echocardiogram. Conclusion(s)/Recommendation(s): Normal biventricular function without evidence of hemodynamically significant valvular heart disease. No intracardiac source of embolism detected on this transthoracic study. A transesophageal echocardiogram is recommended to exclude cardiac source of embolism if clinically indicated. FINDINGS  Left Ventricle: Left ventricular ejection fraction, by estimation, is 65 to 70%. The left ventricle has normal function. The left ventricle has no regional wall motion abnormalities. The left ventricular internal cavity size was normal in size. There is  no left ventricular hypertrophy. Left ventricular diastolic  parameters are indeterminate. Right Ventricle: The right ventricular size is normal. No increase in right ventricular wall thickness. Right ventricular systolic function is normal. There is normal pulmonary artery systolic pressure. The tricuspid regurgitant velocity is 1.81 m/s, and  with an assumed right atrial pressure of 8 mmHg, the estimated right ventricular systolic pressure is 51.0 mmHg.  Left Atrium: Left atrial size was normal in size. Right Atrium: Right atrial size was normal in size. Pericardium: There is no evidence of pericardial effusion. Presence of pericardial fat pad. Mitral Valve: The mitral valve is normal in structure. Trivial mitral valve regurgitation. No evidence of mitral valve stenosis. Tricuspid Valve: The tricuspid valve is normal in structure. Tricuspid valve regurgitation is trivial. No evidence of tricuspid stenosis. Aortic Valve: The aortic valve is tricuspid. Aortic valve regurgitation is not visualized. No aortic stenosis is present. Pulmonic Valve: The pulmonic valve was not well visualized. Pulmonic valve regurgitation is not visualized. Aorta: The aortic root, ascending aorta and aortic arch are all structurally normal, with no evidence of dilitation or obstruction. Venous: The inferior vena cava was not well visualized. IAS/Shunts: No atrial level shunt detected by color flow Doppler.  LEFT VENTRICLE PLAX 2D LVIDd:         4.10 cm  Diastology LVIDs:         2.50 cm  LV e' medial:    3.94 cm/s LV PW:         1.00 cm  LV E/e' medial:  14.5 LV IVS:        1.00 cm  LV e' lateral:   4.56 cm/s LVOT diam:     1.90 cm  LV E/e' lateral: 12.5 LV SV:         42 LV SV Index:   26 LVOT Area:     2.84 cm  RIGHT VENTRICLE TAPSE (M-mode): 1.9 cm LEFT ATRIUM             Index       RIGHT ATRIUM           Index LA diam:        2.80 cm 1.75 cm/m  RA Area:     12.10 cm LA Vol (A2C):   28.7 ml 17.95 ml/m RA Volume:   26.00 ml  16.26 ml/m LA Vol (A4C):   29.2 ml 18.27 ml/m LA Biplane Vol: 30.3 ml 18.95 ml/m  AORTIC VALVE LVOT Vmax:   71.80 cm/s LVOT Vmean:  49.900 cm/s LVOT VTI:    0.148 m  AORTA Ao Root diam: 2.60 cm Ao Asc diam:  2.40 cm MITRAL VALVE               TRICUSPID VALVE MV Area (PHT): 2.73 cm    TR Peak grad:   13.1 mmHg MV Decel Time: 278 msec    TR Vmax:        181.00 cm/s MV E velocity: 57.00 cm/s MV A velocity: 71.10 cm/s  SHUNTS MV E/A ratio:  0.80        Systemic  VTI:  0.15 m                            Systemic Diam: 1.90 cm Buford Dresser MD Electronically signed by Buford Dresser MD Signature Date/Time: 01/18/2020/11:49:35 AM    Final     Assessment/Plan 1. Essential hypertension B/p at goal today.continue on amlodipine,losartan and metoprolol. - AMB Referral to [facility] for medical assistance  program went for 1-2 months without her blood pressure medication because she was unable to pay for medication.  - CBC with Differential/Platelet - CMP with eGFR([facility]) - TSH - Lipid Panel  2. Hyperlipidemia LDL goal <100 Recent LDL not at goal.Crestor was initiated during hospital admission.  - AMB Referral to [facility] [facility] for medication assistance.  - Lipid Panel  3. Type 2 diabetes mellitus without complication, without long-term current use of insulin (HCC) Lab Results  Component Value Date   HGBA1C 6.4 (H) 01/17/2020   No CBG for review.  Metformin was initiated  500 mg tablet daily. - AMB Referral to [place] [facility] - TSH - Lipid Panel - Hemoglobin A1c - Ambulatory referral to Podiatry  4. History of stroke Status post hospital admission for stroke continue on metoprolol ,ASA,lossrtan  - AMB Referral to [place] [facility] fro assistance buying medication  - CBC with Differential/Platelet - CMP with eGFR([facility]) - follow up with Neurologist as directed  5. Unsteady gait - continue to walk with walker  - continue with PT/OT  - AMB Referral to [place] [facility]  6. Encounter for hepatitis C screening test for low risk patient Low risk  - Hep C Antibody  7. Bilateral lower extremity edema Bilateral lower extremities 1-2 + edema. - Keep legs elevated when seated  - continue on furosemide e  - Compression stockings knee high on the morning and remove at bedtime.   Family/ staff Communication: Reviewed plan of care with patient and daughter   Labs/tests ordered:  - TSH - Lipid Panel -  Hemoglobin A1c - CBC with Differential/Platelet - CMP with eGFR([facility]) - Hep C Antibody  Next Appointment : 4 months for medical management of chronic issues   Sandrea Hughs, NP

## 2020-02-14 LAB — COMPLETE METABOLIC PANEL WITH GFR
AG Ratio: 1.5 (calc) (ref 1.0–2.5)
ALT: 15 U/L (ref 6–29)
AST: 19 U/L (ref 10–35)
Albumin: 4 g/dL (ref 3.6–5.1)
Alkaline phosphatase (APISO): 116 U/L (ref 37–153)
BUN/Creatinine Ratio: 12 (calc) (ref 6–22)
BUN: 16 mg/dL (ref 7–25)
CO2: 26 mmol/L (ref 20–32)
Calcium: 9.5 mg/dL (ref 8.6–10.4)
Chloride: 103 mmol/L (ref 98–110)
Creat: 1.31 mg/dL — ABNORMAL HIGH (ref 0.60–0.93)
GFR, Est African American: 45 mL/min/{1.73_m2} — ABNORMAL LOW (ref >=60)
GFR, Est Non African American: 39 mL/min/{1.73_m2} — ABNORMAL LOW (ref >=60)
Globulin: 2.7 g/dL (calc) (ref 1.9–3.7)
Glucose, Bld: 108 mg/dL — ABNORMAL HIGH (ref 65–99)
Potassium: 3.6 mmol/L (ref 3.5–5.3)
Sodium: 140 mmol/L (ref 135–146)
Total Bilirubin: 0.4 mg/dL (ref 0.2–1.2)
Total Protein: 6.7 g/dL (ref 6.1–8.1)

## 2020-02-14 LAB — HEMOGLOBIN A1C
Hgb A1c MFr Bld: 6.6 % of total Hgb — ABNORMAL HIGH (ref <5.7)
Mean Plasma Glucose: 143 (calc)
eAG (mmol/L): 7.9 (calc)

## 2020-02-14 LAB — CBC WITH DIFFERENTIAL/PLATELET
Absolute Monocytes: 469 cells/uL (ref 200–950)
Basophils Absolute: 62 cells/uL (ref 0–200)
Basophils Relative: 0.9 %
Eosinophils Absolute: 290 cells/uL (ref 15–500)
Eosinophils Relative: 4.2 %
HCT: 44.2 % (ref 35.0–45.0)
Hemoglobin: 15 g/dL (ref 11.7–15.5)
Lymphs Abs: 1939 cells/uL (ref 850–3900)
MCH: 30.5 pg (ref 27.0–33.0)
MCHC: 33.9 g/dL (ref 32.0–36.0)
MCV: 90 fL (ref 80.0–100.0)
MPV: 11.1 fL (ref 7.5–12.5)
Monocytes Relative: 6.8 %
Neutro Abs: 4140 cells/uL (ref 1500–7800)
Neutrophils Relative %: 60 %
Platelets: 339 10*3/uL (ref 140–400)
RBC: 4.91 10*6/uL (ref 3.80–5.10)
RDW: 13.1 % (ref 11.0–15.0)
Total Lymphocyte: 28.1 %
WBC: 6.9 10*3/uL (ref 3.8–10.8)

## 2020-02-14 LAB — LIPID PANEL
Cholesterol: 191 mg/dL (ref <200)
HDL: 48 mg/dL — ABNORMAL LOW (ref >=50)
LDL Cholesterol (Calc): 125 mg/dL (calc) — ABNORMAL HIGH
Non-HDL Cholesterol (Calc): 143 mg/dL (calc) — ABNORMAL HIGH (ref <130)
Total CHOL/HDL Ratio: 4 (calc) (ref <5.0)
Triglycerides: 85 mg/dL (ref <150)

## 2020-02-14 LAB — HEPATITIS C ANTIBODY
Hepatitis C Ab: NONREACTIVE
SIGNAL TO CUT-OFF: 0.01 (ref <1.00)

## 2020-02-14 LAB — TSH: TSH: 2.39 mIU/L (ref 0.40–4.50)

## 2020-02-14 NOTE — Telephone Encounter (Signed)
Verbal orders given to [facility] with [facility].

## 2020-02-14 NOTE — Telephone Encounter (Signed)
Patient seen 02/13/2020 okay to give orders for PT.

## 2020-02-16 NOTE — Telephone Encounter (Signed)
Kayla from [facility] asked for verbal OT orders and they were given.

## 2020-02-20 ENCOUNTER — Other Ambulatory Visit: Payer: Self-pay | Admitting: *Deleted

## 2020-02-20 ENCOUNTER — Telehealth: Payer: Self-pay | Admitting: *Deleted

## 2020-02-20 NOTE — Patient Outreach (Signed)
[facility]) Care Management  02/20/2020  Heather [name] 1940/10/08 [id number]   Call placed to member's daughter to follow up on stroke recovery and to complete initial assessment.  No answer, HIPAA compliant voice message left.  Will follow up within the next 3-4 business days.  Kemper Durie, [place], MSN [facility] Care Management  [facility] Manager [phone]

## 2020-02-20 NOTE — Telephone Encounter (Signed)
Theora Gianotti with [facility] called requesting Verbal orders for Speech Therapy 1x1, 2x4, 1x3 Verbal orders given.

## 2020-02-21 ENCOUNTER — Encounter: Payer: Self-pay | Admitting: Adult Health

## 2020-02-21 ENCOUNTER — Ambulatory Visit (INDEPENDENT_AMBULATORY_CARE_PROVIDER_SITE_OTHER): Payer: Medicare Other | Admitting: Adult Health

## 2020-02-21 ENCOUNTER — Other Ambulatory Visit: Payer: Self-pay

## 2020-02-21 VITALS — BP 170/78 | HR 63 | Ht 60.0 in | Wt 138.0 lb

## 2020-02-21 DIAGNOSIS — E119 Type 2 diabetes mellitus without complications: Secondary | ICD-10-CM

## 2020-02-21 DIAGNOSIS — E785 Hyperlipidemia, unspecified: Secondary | ICD-10-CM | POA: Diagnosis not present

## 2020-02-21 DIAGNOSIS — I639 Cerebral infarction, unspecified: Secondary | ICD-10-CM

## 2020-02-21 DIAGNOSIS — I10 Essential (primary) hypertension: Secondary | ICD-10-CM | POA: Diagnosis not present

## 2020-02-21 NOTE — Progress Notes (Signed)
[facility] [address] [place]. [place] [id number] [phone]       HOSPITAL FOLLOW UP NOTE  [name] Date of Birth:  December 04, 1940 Medical Record Number:  [id number]   Reason for Referral:  hospital stroke follow up    SUBJECTIVE:   CHIEF COMPLAINT:  Chief Complaint  Patient presents with  . Hospitalization Follow-up    tx rm, daughter Heather [name], daughter states pt has confusion, tremors, and mood problem, right hand numbness, difficulty walking and speaking     HPI:   Heather [name] is a 79 y.o. female w/pmh of HTN, HLD, diabetes, medical non compliance who presents with confusion and gait difficulty. Her gait difficulty started on 01/12/20 and confusion started on 01/16/20. She was taken to an OSH on Tuesday where she was diagnosed with a UTI + CTH was done that revealed and old stroke in her brain. She was sent home on oral abx and presented to [facility] on 01/17/20 due to continued confusion and gait difficulty noted to be swerving to the left side while walking.  Personally reviewed recent hospitalization pertinent progress notes, lab work and imaging with summary provided.  Evaluated by Dr. Roda ShuttersXu with stroke work-up revealing acute right basal ganglia stroke and small acute stroke of the left internal capsule and lentiform nucleus likely secondary to small vessel disease given uncontrolled risk factors of HTN and HLD and controlled DM.  However, given bilateral nature of her stroke, it was recommended to obtain cardiac event monitor to rule out atrial fibrillation.  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  EF 65 to 70%.  Per note, patient apparently self discontinued all of her medications as she did not like her PCP which she is on the past.  She was started on DAPT for 21 days and then aspirin alone as well as Crestor for LDL 239 with prior history of statin myalgia.  Elevated SBP during admission with known HTN  with long-term BP goal normotensive range.  Controlled DM with A1c 6.4 and restarted Metformin at discharge as this was previously thought discontinued.  No prior stroke history.  Residual deficits left facial droop, decreased left hand dexterity, slight weakness of left proximal lower extremity and cognitive impairment.  She was evaluated by therapy and transferred to rehab center [facility] for functional decline on 9/13.  Today, 02/21/2020, Heather [name] is being seen for hospital follow-up accompanied by her daughter.  Per patient, she does not have any residual deficits or symptoms.  Reports working with home health therapy and when asked what they are working with her on she states " nothing really".  She is ambulating with a rolling walker which she was not using previously.  She is currently residing with her older daughter not present at today's visit.  She was previously living in her own apartment independently. Per daughter, she continues to have cognitive impairment, slurred speech, upper extremity shaking, weakness, hand numbness and gait impairment.  She is able to dress independently but per daughter, has difficulty getting her to bathe routinely.  Denies new or worsening stroke/TIA symptoms.  She has completed 3 weeks DAPT and remains on aspirin alone without bleeding or bruising.  Recently established care with PCP who discontinued Crestor and initiated atorvastatin 80 mg daily.  She appears to be tolerating well without side effects.  Blood pressure today 181/74 and on recheck 170/78.  She reports monitoring at home and similar  to today's reading.  They have not been called to complete cardiac event monitor at this time.  No further concerns.   ROS:   14 system review of systems performed and negative with exception of those listed in HPI  PMH:  Past Medical History:  Diagnosis Date  . Diabetes mellitus without complication (HCC)   . High cholesterol   . History of stroke  2021  . Hypertension     PSH: No past surgical history on file.  Social History:  Social History   Socioeconomic History  . Marital status: Single    Spouse name: Not on file  . Number of children: Not on file  . Years of education: Not on file  . Highest education level: Not on file  Occupational History  . Not on file  Tobacco Use  . Smoking status: Never Smoker  . Smokeless tobacco: Never Used  Vaping Use  . Vaping Use: Never used  Substance and Sexual Activity  . Alcohol use: No  . Drug use: No  . Sexual activity: Never  Other Topics Concern  . Not on file  Social History Narrative   Tobacco use, amount per day now: None.   Past tobacco use, amount per day: None.   How many years did you use tobacco: N/A   Alcohol use (drinks per week): None.   Diet:   Do you drink/eat things with caffeine: Coffee.   Marital status: Widowed                                 What year were you married? 1961   Do you live in a house, apartment, assisted living, condo, trailer, etc.? House.   Is it one or more stories? 2   How many persons live in your home? 2   Do you have pets in your home?( please list) Dogs ( 2 )   Highest Level of education completed? 11th Grade.   Current or past profession: [employer].   Do you exercise? No.                                 Type and how often?   Do you have a living will? No   Do you have a DNR form?   No                                If not, do you want to discuss one?   Do you have signed POA/HPOA forms?  No                    If so, please bring to you appointment      Do you have any difficulty bathing or dressing yourself? Yes   Do you have any difficulty preparing food or eating? Yes   Do you have any difficulty managing your medications? Yes   Do you have any difficulty managing your finances? Yes   Do you have any difficulty affording your medications? Yes   Social Determinants of Health   Financial Resource Strain:   . Difficulty  of Paying Living Expenses: Not on file  Food Insecurity: No Food Insecurity  . Worried About Programme researcher, broadcasting/film/video in the Last Year: Never true  . Ran Out of Food in  the Last Year: Never true  Transportation Needs: No Transportation Needs  . Lack of Transportation (Medical): No  . Lack of Transportation (Non-Medical): No  Physical Activity:   . Days of Exercise per Week: Not on file  . Minutes of Exercise per Session: Not on file  Stress:   . Feeling of Stress : Not on file  Social Connections:   . Frequency of Communication with Friends and Family: Not on file  . Frequency of Social Gatherings with Friends and Family: Not on file  . Attends Religious Services: Not on file  . Active Member of Clubs or Organizations: Not on file  . Attends Banker Meetings: Not on file  . Marital Status: Not on file  Intimate Partner Violence:   . Fear of Current or Ex-Partner: Not on file  . Emotionally Abused: Not on file  . Physically Abused: Not on file  . Sexually Abused: Not on file    Family History:  Family History  Problem Relation Age of Onset  . Stroke Mother   . Lung cancer Father   . Cancer Daughter   . Hypertension Daughter   . High Cholesterol Daughter     Medications:   Current Outpatient Medications on File Prior to Visit  Medication Sig Dispense Refill  . acetaminophen (TYLENOL) 325 MG tablet Take by mouth.    Marland Kitchen amLODipine (NORVASC) 10 MG tablet Take 1 tablet (10 mg total) by mouth daily. 30 tablet 0  . aspirin EC 81 MG EC tablet Take 1 tablet (81 mg total) by mouth daily. Swallow whole. 30 tablet 0  . atorvastatin (LIPITOR) 80 MG tablet Take 80 mg by mouth daily.    . cefUROXime (CEFTIN) 500 MG tablet Take 500 mg by mouth 2 (two) times daily with a meal.     . clopidogrel (PLAVIX) 75 MG tablet Take 75 mg by mouth daily.    Marland Kitchen LOSARTAN POTASSIUM PO Take 75 mg by mouth daily.    . metFORMIN (GLUCOPHAGE) 500 MG tablet Take 500 mg by mouth daily.    . metoprolol  tartrate (LOPRESSOR) 25 MG tablet Take 1 tablet (25 mg total) by mouth 2 (two) times daily. 60 tablet 0   No current facility-administered medications on file prior to visit.    Allergies:   Allergies  Allergen Reactions  . Iohexol      Desc: Shaking and sore throat, Onset Date: [id number]   . Iohexol Other (See Comments)    Reaction not recalled      OBJECTIVE:  Physical Exam  Vitals:   02/21/20 1310  BP: (!) 181/74  Pulse: 63  Weight: 138 lb (62.6 kg)  Height: 5' (1.524 m)   Body mass index is 26.95 kg/m. No exam data present  General: well developed, well nourished,  pleasant elderly Caucasian female, seated, in no evident distress Head: head normocephalic and atraumatic.   Neck: supple with no carotid or supraclavicular bruits Cardiovascular: regular rate and rhythm, no murmurs Musculoskeletal: no deformity Skin:  no rash/petichiae Vascular:  Normal pulses all extremities   Neurologic Exam Mental Status: Awake and fully alert.   Mild dysarthria without evidence of aphasia.  Disoriented to place and time. Recent and remote memory impaired. Attention span, concentration and fund of knowledge impaired. Mood and affect flat but cooperative.  Recall 0/3.  Difficulty with serial additions.  Four-legged animal naming 5 in 60 seconds. Cranial Nerves: Fundoscopic exam reveals sharp disc margins. Pupils equal, briskly reactive to light. Extraocular movements  full without nystagmus. Visual fields full to confrontation. Hearing intact. Facial sensation intact. Face, tongue, palate moves normally and symmetrically.  Motor: Normal bulk and tone. Normal strength in all tested extremity muscles except slightly decreased left hand dexterity, left hip flexor weakness and ankle dorsiflexion weakness. Sensory.: intact to touch , pinprick , position and vibratory sensation except decreased vibratory sensation RLE distally.  Coordination: Rapid alternating movements normal in all extremities  except decreased left hand. Finger-to-nose and heel-to-shin performed accurately bilaterally. Gait and Station: Arises from chair without difficulty. Stance is hunched. Gait demonstrates  short shuffled steps with unsteadiness and use of rolling walker Reflexes: 1+ and symmetric. Toes downgoing.     NIHSS  3 Modified Rankin  3     ASSESSMENT: Heather [name] is a 79 y.o. year old female presented with confusion, left-sided weakness, HTN and gait difficulty on 01/17/2020 with stroke work-up revealing right BG infarct and left internal capsule punctate infarcts likely secondary to small vessel disease with uncontrolled risk factors.  However, given bilateral nature of stroke, Dr. Roda Shutters recommended cardiac event monitor outpatient to rule out atrial fibrillation.  Vascular risk factors include HTN, HLD, DM, L VA occlusion chronic and medication noncompliance.      PLAN:  1. R BG and L IC strokes :  a. Residual deficit: Mild left hand, hip flexor and ankle dorsiflexion weakness, bilateral hand numbness/tingling, mild dysarthria, gait impairment and cognitive impairment with behaviors.  Continue working with home health therapy and potentially transition to outpatient therapy once completed.   b. Continue aspirin 81 mg daily  and atorvastatin 80 mg daily for secondary stroke prevention.  c. Will reach out to cardiology to request further assistance with cardiac monitor -order placed at hospital discharge d. Discussed secondary stroke prevention measures and importance of close PCP follow up for aggressive stroke risk factor management  2. HTN: BP goal <130/90.  Elevated today and per patient, uncontrolled at home but unsure of accuracy of this,.  Reports compliance with amlodipine and metoprolol.  Recommend continued monitoring at home and to follow-up with PCP if remains elevated 3. HLD: LDL goal <70. Recent LDL 125 previously 239 during hospitalization.  Started on Crestor 40 mg daily during  hospitalization but PCP recently changed to atorvastatin 80 mg daily.  Continue current statin with routine monitoring and prescribing by PCP 4. DMII: A1c goal<7.0. Recent A1c 6.6 previously 6.4 during hospitalization. On Metformin per PCP    Follow up in 3 months or call earlier if needed   I spent 45 minutes of face-to-face and non-face-to-face time with patient and daughter.  This included previsit chart review, lab review, study review, order entry, electronic health record documentation, patient education regarding recent stroke including etiology, residual deficits, importance of managing stroke risk factors and answered all questions to patient and daughters satisfaction     Ihor Austin, [facility]  [facility] [address] [place], [place] [address]  Phone [phone] Fax [phone] Note: This document was prepared with digital dictation and possible smart phrase technology. Any transcriptional errors that result from this process are unintentional.

## 2020-02-21 NOTE — Progress Notes (Signed)
I agree with the above plan 

## 2020-02-21 NOTE — Patient Instructions (Signed)
Your residual deficits will likely continue to improve as you work with therapy. Typically you will see your greatest improvement over the first 3 months but may see additional improvement up to 6 months and even a year. Working with therapy and doing exercises on your own will give you the greatest recovery  We will reach out to cardiology regarding cardiac monitor to rule out atrial fibrillation as potential stroke cause  Continue aspirin 81 mg daily  and atorvastatin 80mg  daily for secondary stroke prevention  Continue to follow up with PCP regarding cholesterol, blood pressure and diabetes management  Maintain strict control of hypertension with blood pressure goal below 130/90, diabetes with hemoglobin A1c goal below 7.0% and cholesterol with LDL cholesterol (bad cholesterol) goal below 70 mg/dL.      Followup in the future with me in 3 months or call earlier if needed      Thank you for coming to see at [facility]. I hope we have been able to provide you high quality care today.  You may receive a patient satisfaction survey over the next few weeks. We would appreciate your feedback and comments so that we may continue to improve ourselves and the health of our patients.

## 2020-02-22 ENCOUNTER — Other Ambulatory Visit: Payer: Self-pay | Admitting: Neurology

## 2020-02-22 ENCOUNTER — Telehealth: Payer: Self-pay | Admitting: Adult Health

## 2020-02-22 NOTE — Telephone Encounter (Signed)
Pt's daughter [name], on [place] called stating that the pt's Physical Therapist said there was a change on the pt's medications. Daughter would like to know why the pt's clopidogrel (PLAVIX) 75 MG tablet is not showing on the chart. Please advise.

## 2020-02-23 ENCOUNTER — Other Ambulatory Visit: Payer: Self-pay | Admitting: *Deleted

## 2020-02-23 NOTE — Patient Outreach (Signed)
[facility]) Care Management  02/23/2020  Heather [name] October 24, 1940 [id number]   Call placed to member to follow up on ongoing stroke recovery, no answer.  Unable to leave message.  Call then placed to daughter, unsuccessful, HIPAA compliant voice message left.   Update:  Voice message received from daughter, requesting call back after 5pm once she is off work.  Call placed to daughter per her request, she report she does not feel member is improving mentally.  State member is still emotional, forgetful, and restless, especially in the evenings.  She does acknowledge that member was also diagnosed with dementia, also feel member may be showing signs of Parkinson's.  Was seen by Neuro on 10/13, follow up in 3 months.  Member has continued to work with PT, using a walker for ambulation however daughter remains concerned about her progress.  She is interested in having additional support and another assessment of needs in the home.  She is requesting palliative care referral, will also place referral to [facility].  She denies any urgent needs at this time.    Will place referrals to [facility] and [facility].  Will follow up with member/daughter within the next month.  Goals Addressed            This Visit's Progress   . [facility] - Keep or Improve My Strength   On track    Follow Up Date 03/07/20   - arrange in-home help services - increase activity or exercise time a little every week - know who to call for help if I fall    Why is this important?   Before the stroke you probably did not think much about being safe when you are up and about.  Now, it may be harder for you to get around.  It may also be easier for you to trip or fall.  It is common to have muscle weakness after a stroke. You may also feel like you cannot control an arm or leg.  It will be helpful to work with a physical therapist to get your strength and muscle control back.  It is good to  stay as active as you can. Walking and stretching help you stay strong and flexible.  The physical therapist will develop an exercise program just for you.     Notes: Confirmed patient will have PT and family with her throughout the day    . [facility] - Make and Keep All Appointments   On track    Follow Up Date within the next 2 weeks   - ask family or friend for a ride - call to cancel if needed - keep a calendar with appointment dates    Why is this important?   Part of staying healthy is seeing the doctor for follow-up care.  If you forget your appointments, there are some things you can do to stay on track.    Notes: New PCP appointment and Neurology scheduled      Kemper Durie, RN, MSN [facility] Care Management  [facility] Manager [phone]

## 2020-02-27 NOTE — Telephone Encounter (Signed)
Called the daughter back and explained that as previously discussed in the last visit she is able to come off the plavix. Advised Jessica dc the medication and that is why this was taken off med list. apologized in the delay in responding as I just wanting to confirm this with Shanda Bumps and she was out for vacation. She was appreciative for the call back

## 2020-02-27 NOTE — Telephone Encounter (Signed)
Aspirin and Plavix was only to be continued for duration of 21 days and then aspirin alone.  She does not need to continue Plavix which was discussed at recent visit.  Thank you.

## 2020-03-05 ENCOUNTER — Other Ambulatory Visit: Payer: Self-pay | Admitting: *Deleted

## 2020-03-05 MED ORDER — AMLODIPINE BESYLATE 10 MG PO TABS: 10.0000 mg | ORAL_TABLET | Freq: Every day | ORAL | 1 refills | Status: DC

## 2020-03-05 MED ORDER — METOPROLOL TARTRATE 25 MG PO TABS: 25.0000 mg | ORAL_TABLET | Freq: Two times a day (BID) | ORAL | 1 refills | Status: DC

## 2020-03-05 MED ORDER — LOSARTAN POTASSIUM 25 MG PO TABS: 75.0000 mg | ORAL_TABLET | Freq: Every day | ORAL | 1 refills | Status: DC

## 2020-03-05 MED ORDER — ATORVASTATIN CALCIUM 80 MG PO TABS
80.0000 mg | ORAL_TABLET | Freq: Every day | ORAL | 1 refills | Status: DC
Start: 2020-03-05 — End: 2020-06-21

## 2020-03-05 NOTE — Telephone Encounter (Signed)
Patient daughter requested refills.  Pended Rx's and sent to [facility] for approval due to HIGH ALERT Warning.

## 2020-03-08 ENCOUNTER — Other Ambulatory Visit: Payer: Self-pay

## 2020-03-08 MED ORDER — METFORMIN HCL 500 MG PO TABS
500.0000 mg | ORAL_TABLET | Freq: Every day | ORAL | 1 refills | Status: DC
Start: 2020-03-08 — End: 2020-04-02

## 2020-03-08 MED ORDER — CEFUROXIME AXETIL 500 MG PO TABS
500.0000 mg | ORAL_TABLET | Freq: Two times a day (BID) | ORAL | 1 refills | Status: DC
Start: 2020-03-08 — End: 2020-05-01

## 2020-03-08 NOTE — Telephone Encounter (Signed)
request for refill for antibiotics received are you taking antibiotics daily.I've no records indicating that you are taking it on a regular basis.please clarify.

## 2020-03-08 NOTE — Telephone Encounter (Signed)
rx sent to pharmacy by e-script  

## 2020-03-12 ENCOUNTER — Other Ambulatory Visit: Payer: Self-pay

## 2020-03-12 ENCOUNTER — Ambulatory Visit (INDEPENDENT_AMBULATORY_CARE_PROVIDER_SITE_OTHER): Payer: Medicare Other | Admitting: Podiatry

## 2020-03-12 DIAGNOSIS — M79674 Pain in right toe(s): Secondary | ICD-10-CM

## 2020-03-12 DIAGNOSIS — L02612 Cutaneous abscess of left foot: Secondary | ICD-10-CM

## 2020-03-12 DIAGNOSIS — B351 Tinea unguium: Secondary | ICD-10-CM | POA: Diagnosis not present

## 2020-03-12 DIAGNOSIS — M79675 Pain in left toe(s): Secondary | ICD-10-CM

## 2020-03-12 DIAGNOSIS — M2062 Acquired deformities of toe(s), unspecified, left foot: Secondary | ICD-10-CM | POA: Diagnosis not present

## 2020-03-12 DIAGNOSIS — L03032 Cellulitis of left toe: Secondary | ICD-10-CM

## 2020-03-12 DIAGNOSIS — M21619 Bunion of unspecified foot: Secondary | ICD-10-CM | POA: Diagnosis not present

## 2020-03-12 DIAGNOSIS — L84 Corns and callosities: Secondary | ICD-10-CM | POA: Diagnosis not present

## 2020-03-12 DIAGNOSIS — I639 Cerebral infarction, unspecified: Secondary | ICD-10-CM | POA: Diagnosis not present

## 2020-03-12 MED ORDER — DOXYCYCLINE HYCLATE 100 MG PO TABS: 100.0000 mg | ORAL_TABLET | Freq: Two times a day (BID) | ORAL | 0 refills | Status: DC

## 2020-03-13 NOTE — Progress Notes (Signed)
Subjective:   Patient ID: Heather [name], female   DOB: 79 y.o.   MRN: [id number]   HPI 79 year old female presents the office with her daughter for concerns of a painful spot on the tip of the left second toe.  The area is pressure in shoes and pressure.  Hurts consistently.  Denies any drainage or pus but has been somewhat red and swollen to the tip of the toe.  No recent treatment.  Also asking for nails be trimmed as she cannot trim herself they are thick and discolored.   Review of Systems  All other systems reviewed and are negative.  Past Medical History:  Diagnosis Date  . Diabetes mellitus without complication (HCC)   . High cholesterol   . History of stroke 2021  . Hypertension     No past surgical history on file.   Current Outpatient Medications:  .  acetaminophen (TYLENOL) 325 MG tablet, Take by mouth., Disp: , Rfl:  .  amLODipine (NORVASC) 10 MG tablet, Take 1 tablet (10 mg total) by mouth daily., Disp: 90 tablet, Rfl: 1 .  atorvastatin (LIPITOR) 80 MG tablet, Take 1 tablet (80 mg total) by mouth daily., Disp: 90 tablet, Rfl: 1 .  cefUROXime (CEFTIN) 500 MG tablet, Take 1 tablet (500 mg total) by mouth 2 (two) times daily with a meal., Disp: 180 tablet, Rfl: 1 .  doxycycline (VIBRA-TABS) 100 MG tablet, Take 1 tablet (100 mg total) by mouth 2 (two) times daily., Disp: 20 tablet, Rfl: 0 .  losartan (COZAAR) 25 MG tablet, Take 3 tablets (75 mg total) by mouth daily., Disp: 270 tablet, Rfl: 1 .  metFORMIN (GLUCOPHAGE) 500 MG tablet, Take 1 tablet (500 mg total) by mouth daily., Disp: 90 tablet, Rfl: 1 .  metoprolol tartrate (LOPRESSOR) 25 MG tablet, Take 1 tablet (25 mg total) by mouth 2 (two) times daily., Disp: 180 tablet, Rfl: 1  Allergies  Allergen Reactions  . Iohexol      Desc: Shaking and sore throat, Onset Date: [id number]   . Iohexol Other (See Comments)    Reaction not recalled         Objective:  Physical Exam  General: AAO x3, NAD  Dermatological: At  the distal aspect of the left second toe there is a preulcerative area with scab present with tip with localized edema and erythema to the tip of the toe there is no drainage or pus or ascending cellulitis.  Nails are hypertrophic, dystrophic and discolored with yellow-brown discoloration x10.  Tenderness nails 1-5 bilaterally.    Vascular: Dorsalis Pedis artery and Posterior Tibial artery pedal pulses are 2/4 bilateral with immedate capillary fill time.  There is no pain with calf compression, swelling, warmth, erythema.   Neruologic: Grossly intact via light touch bilateral.   Musculoskeletal: Bunion deformities present left side worse than right on the left side the second toe is overlapping the hallux.  There is resulting the pressure point the tip of the toe resulting in a preulcerative area.  Muscular strength 5/5 in all groups tested bilateral.  Gait: Unassisted, Nonantalgic.       Assessment:   79 year old female with preulcerative wound left second toe, localized cellulitis; symptomatic onychomycosis     Plan:  -Treatment options discussed including all alternatives, risks, and complications -Etiology of symptoms were discussed -Recommend antibiotic ointment dressing changes daily.  Offloading offloading pads were dispensed.  Prescribed doxycycline given the erythema. -Debrided nails x10 without complications or bleeding  *X-ray next appointment if  still having symptoms left foot  Vivi Barrack DPM

## 2020-03-15 ENCOUNTER — Telehealth: Payer: Self-pay

## 2020-03-15 NOTE — Telephone Encounter (Signed)
Late enrty- incoming call received yesterday from Lanette with [facility] whom was performing an audit and noticed that a plan of care with the start date 02/07/2020-04/06/2020 was not signed by Dr.Reed.  I was able to view scanned document under media and noticed that Dinah (pcp) signed the first page of document which indicated she reviewed document yet as Lanette mentioned the last page that required attending physician's signature and date was left blank.  Per Lanette this document needs to be signed by Dr.Reed, yet she is curious as to why, if Carilyn Goodpasture thought she was to sign why she left that last page blank.  I had mentioned that patient did not establish care with [facility] until 02/13/2020 which was after the start date of care. That could be a possible reason or it could be a mishap.   I spoke with Dr.Reed about the situation yesterday and she signed paperwork. Paperwork faxed back to [phone]

## 2020-03-20 ENCOUNTER — Telehealth: Payer: Self-pay | Admitting: *Deleted

## 2020-03-20 ENCOUNTER — Other Ambulatory Visit: Payer: Self-pay | Admitting: *Deleted

## 2020-03-20 ENCOUNTER — Encounter: Payer: Self-pay | Admitting: Family

## 2020-03-20 NOTE — Telephone Encounter (Signed)
Will continue to be the PCP if okay with patient and POA.

## 2020-03-20 NOTE — Telephone Encounter (Signed)
Fran notified and agreed.  °

## 2020-03-20 NOTE — Patient Outreach (Signed)
[facility]) Care Management  03/20/2020  Heather [name] May 07, 1941 [id number]   Call placed to member's daughter, Sharyn Lull.  She report member has still been improving medically, continue to work with home health for PT and OT for increased strength.  She and other sister remain concerned about member's memory as well as Parkinson's symptoms.  These concerns were discussed during last visit with neurology, no new meds started, will have follow up on February.  Will also have follow up with PCP in February.    Daughter continue to voice concern about needing additional support, particularly around help with ADL's.  Member is refusing to bathe/shower despite safety issues being addressed (has hand held shower head, shower chair, and grab bars).  Made aware that referral has been placed to [facility] for home based palliative care per family's request.  Denies any other concerns at this time.  Encouraged to contact this care manager with questions.  Will follow up with daughter within the next month.  If active with [facility], will close case.  Goals Addressed            This Visit's Progress   . [facility] - Keep or Improve My Strength   On track    Follow Up Date 05/09/2020   - arrange in-home help services - increase activity or exercise time a little every week - know who to call for help if I fall    Why is this important?   Before the stroke you probably did not think much about being safe when you are up and about.  Now, it may be harder for you to get around.  It may also be easier for you to trip or fall.  It is common to have muscle weakness after a stroke. You may also feel like you cannot control an arm or leg.  It will be helpful to work with a physical therapist to get your strength and muscle control back.  It is good to stay as active as you can. Walking and stretching help you stay strong and flexible.  The physical therapist will develop an  exercise program just for you.     Notes: Confirmed patient will have PT and family with her throughout the day  11/10 - remains active with PT/OT for strength training    . [facility] - Make and Keep All Appointments   On track    Follow Up Date 12/10   - ask family or friend for a ride - call to cancel if needed - keep a calendar with appointment dates    Why is this important?   Part of staying healthy is seeing the doctor for follow-up care.  If you forget your appointments, there are some things you can do to stay on track.    Notes: New PCP appointment and Neurology scheduled - met  11/10 - New referral to Palliative Care, pending appointment      Valente David, RN, MSN [place] Manager [phone]

## 2020-03-20 NOTE — Progress Notes (Signed)
   Patient has been referred for the [facility], [facility]'s home-based palliative care service.  Attempting to contact patient's daughter to schedule visit.  Ellard Artis Referral Specialist [phone]

## 2020-03-20 NOTE — Telephone Encounter (Signed)
Heather [name] with [facility] called stating that [facility] referred patient for Palliative Care Services.   Hospice is wanting to know if you will be the attending.  Please Advise.

## 2020-03-21 ENCOUNTER — Encounter: Payer: Self-pay | Admitting: *Deleted

## 2020-03-21 NOTE — Progress Notes (Signed)
   Patient's initial [facility] visit is scheduled for Tuesday, 03/27/20 at 10am at patient's home.  [facility] is a division of [facility]'s home-based palliative care program.  Thank you for allowing [place] to participate in this patient's care.  Ellard Artis Referrals Specialist [phone]

## 2020-04-01 ENCOUNTER — Telehealth: Payer: Self-pay | Admitting: *Deleted

## 2020-04-01 NOTE — Telephone Encounter (Signed)
Theora Gianotti with [facility] called requesting verbal orders to continue speech therapy 1x4wks.  Verbal orders given.

## 2020-04-02 ENCOUNTER — Other Ambulatory Visit: Payer: Self-pay

## 2020-04-02 ENCOUNTER — Encounter: Payer: Self-pay | Admitting: Family

## 2020-04-02 ENCOUNTER — Ambulatory Visit (INDEPENDENT_AMBULATORY_CARE_PROVIDER_SITE_OTHER): Payer: Medicare Other | Admitting: Family

## 2020-04-02 VITALS — BP 158/80 | HR 75 | Temp 96.9°F | Resp 16 | Ht 60.0 in | Wt 140.0 lb

## 2020-04-02 DIAGNOSIS — I639 Cerebral infarction, unspecified: Secondary | ICD-10-CM | POA: Diagnosis not present

## 2020-04-02 DIAGNOSIS — R600 Localized edema: Secondary | ICD-10-CM

## 2020-04-02 DIAGNOSIS — E119 Type 2 diabetes mellitus without complications: Secondary | ICD-10-CM

## 2020-04-02 DIAGNOSIS — F321 Major depressive disorder, single episode, moderate: Secondary | ICD-10-CM | POA: Diagnosis not present

## 2020-04-02 DIAGNOSIS — I10 Essential (primary) hypertension: Secondary | ICD-10-CM

## 2020-04-02 MED ORDER — METFORMIN HCL 500 MG PO TABS: 500.0000 mg | ORAL_TABLET | Freq: Two times a day (BID) | ORAL | 1 refills | Status: DC

## 2020-04-02 MED ORDER — SERTRALINE HCL 25 MG PO TABS: 25.0000 mg | ORAL_TABLET | Freq: Every day | ORAL | 0 refills | Status: DC

## 2020-04-02 MED ORDER — LOSARTAN POTASSIUM 100 MG PO TABS: 100.0000 mg | ORAL_TABLET | Freq: Every day | ORAL | 0 refills | Status: DC

## 2020-04-02 MED ORDER — CLONIDINE HCL 0.1 MG PO TABS
0.1000 mg | ORAL_TABLET | Freq: Once | ORAL | Status: AC
  Administered 2020-04-02: 0.1 mg via ORAL
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
## 2020-04-02 NOTE — Patient Instructions (Addendum)
- wear compression stockings in the morning and take off at bedtime for bilateral leg swellings. - Keep legs elevated when seated.  - Notify provider if depression symptoms worsen  - check your blood pressure daily and notify provider if B/p > 140/90    Major Depressive Disorder, Adult Major depressive disorder (MDD) is a mental health condition. MDD often makes you feel sad, hopeless, or helpless. MDD can also cause symptoms in your body. MDD can affect your:  Work.  School.  Relationships.  Other normal activities. MDD can range from mild to very bad. It may occur once (single episode MDD). It can also occur many times (recurrent MDD). The main symptoms of MDD often include:  Feeling sad, depressed, or irritable most of the time.  Loss of interest. MDD symptoms also include:  Sleeping too much or too little.  Eating too much or too little.  A change in your weight.  Feeling tired (fatigue) or having low energy.  Feeling worthless.  Feeling guilty.  Trouble making decisions.  Trouble thinking clearly.  Thoughts of suicide or harming others.  Feeling weak.  Feeling agitated.  Keeping yourself from being around other people (isolation). Follow these instructions at home: Activity  Do these things as told by your doctor: ? Go back to your normal activities. ? Exercise regularly. ? Spend time outdoors. Alcohol  Talk with your doctor about how alcohol can affect your antidepressant medicines.  Do not drink alcohol. Or, limit how much alcohol you drink. ? This means no more than 1 drink a day for nonpregnant women and 2 drinks a day for men. One drink equals one of these:  12 oz of beer.  5 oz of wine.  1 oz of hard liquor. General instructions  Take over-the-counter and prescription medicines only as told by your doctor.  Eat a healthy diet.  Get plenty of sleep.  Find activities that you enjoy. Make time to do them.  Think about joining a  support group. Your doctor may be able to suggest a group for you.  Keep all follow-up visits as told by your doctor. This is important. Where to find more information:  The [employer] on Mental Illness: ? www.nami.org  [place] [employer] of Mental Health: ? http://www.maynard.net/  National Suicide Prevention Lifeline: ? 256-318-5993. This is free, 24-hour help. Contact a doctor if:  Your symptoms get worse.  You have new symptoms. Get help right away if:  You self-harm.  You see, hear, taste, smell, or feel things that are not present (hallucinate). If you ever feel like you may hurt yourself or others, or have thoughts about taking your own life, get help right away. You can go to your nearest emergency department or call:  Your local emergency services (911 in the [place]  A suicide crisis helpline, such as the National Suicide Prevention Lifeline: ? 272-862-9941. This is open 24 hours a day. This information is not intended to replace advice given to you by your health care provider. Make sure you discuss any questions you have with your health care provider. Document Revised: 04/09/2017 Document Reviewed: 01/12/2016 Elsevier Patient Education  2020 Elsevier Inc. Sertraline tablets What is this medicine? SERTRALINE (SER tra leen) is used to treat depression. It may also be used to treat obsessive compulsive disorder, panic disorder, post-trauma stress, premenstrual dysphoric disorder (PMDD) or social anxiety. This medicine may be used for other purposes; ask your health care provider or pharmacist if you have questions. COMMON BRAND NAME(S):  Zoloft What should I tell my health care provider before I take this medicine? They need to know if you have any of these conditions:  bleeding disorders  bipolar disorder or a family history of bipolar disorder  glaucoma  heart disease  high blood pressure  history of irregular heartbeat  history of low levels of  calcium, magnesium, or potassium in the blood  if you often drink alcohol  liver disease  receiving electroconvulsive therapy  seizures  suicidal thoughts, plans, or attempt; a previous suicide attempt by you or a family member  take medicines that treat or prevent blood clots  thyroid disease  an unusual or allergic reaction to sertraline, other medicines, foods, dyes, or preservatives  pregnant or trying to get pregnant  breast-feeding How should I use this medicine? Take this medicine by mouth with a glass of water. Follow the directions on the prescription label. You can take it with or without food. Take your medicine at regular intervals. Do not take your medicine more often than directed. Do not stop taking this medicine suddenly except upon the advice of your doctor. Stopping this medicine too quickly may cause serious side effects or your condition may worsen. A special MedGuide will be given to you by the pharmacist with each prescription and refill. Be sure to read this information carefully each time. Talk to your pediatrician regarding the use of this medicine in children. While this drug may be prescribed for children as young as 7 years for selected conditions, precautions do apply. Overdosage: If you think you have taken too much of this medicine contact a poison control center or emergency room at once. NOTE: This medicine is only for you. Do not share this medicine with others. What if I miss a dose? If you miss a dose, take it as soon as you can. If it is almost time for your next dose, take only that dose. Do not take double or extra doses. What may interact with this medicine? Do not take this medicine with any of the following medications:  cisapride  dronedarone  linezolid  MAOIs like Carbex, Eldepryl, Marplan, Nardil, and Parnate  methylene blue (injected into a vein)  pimozide  thioridazine This medicine may also interact with the following  medications:  alcohol  amphetamines  aspirin and aspirin-like medicines  certain medicines for depression, anxiety, or psychotic disturbances  certain medicines for fungal infections like ketoconazole, fluconazole, posaconazole, and itraconazole  certain medicines for irregular heart beat like flecainide, quinidine, propafenone  certain medicines for migraine headaches like almotriptan, eletriptan, frovatriptan, naratriptan, rizatriptan, sumatriptan, zolmitriptan  certain medicines for sleep  certain medicines for seizures like carbamazepine, valproic acid, phenytoin  certain medicines that treat or prevent blood clots like warfarin, enoxaparin, dalteparin  cimetidine  digoxin  diuretics  fentanyl  isoniazid  lithium  NSAIDs, medicines for pain and inflammation, like ibuprofen or naproxen  other medicines that prolong the QT interval (cause an abnormal heart rhythm) like dofetilide  rasagiline  safinamide  supplements like St. John's wort, kava kava, valerian  tolbutamide  tramadol  tryptophan This list may not describe all possible interactions. Give your health care provider a list of all the medicines, herbs, non-prescription drugs, or dietary supplements you use. Also tell them if you smoke, drink alcohol, or use illegal drugs. Some items may interact with your medicine. What should I watch for while using this medicine? Tell your doctor if your symptoms do not get better or if they get worse.  Visit your doctor or health care professional for regular checks on your progress. Because it may take several weeks to see the full effects of this medicine, it is important to continue your treatment as prescribed by your doctor. Patients and their families should watch out for new or worsening thoughts of suicide or depression. Also watch out for sudden changes in feelings such as feeling anxious, agitated, panicky, irritable, hostile, aggressive, impulsive, severely  restless, overly excited and hyperactive, or not being able to sleep. If this happens, especially at the beginning of treatment or after a change in dose, call your health care professional. Bonita Quin may get drowsy or dizzy. Do not drive, use machinery, or do anything that needs mental alertness until you know how this medicine affects you. Do not stand or sit up quickly, especially if you are an older patient. This reduces the risk of dizzy or fainting spells. Alcohol may interfere with the effect of this medicine. Avoid alcoholic drinks. Your mouth may get dry. Chewing sugarless gum or sucking hard candy, and drinking plenty of water may help. Contact your doctor if the problem does not go away or is severe. What side effects may I notice from receiving this medicine? Side effects that you should report to your doctor or health care professional as soon as possible:  allergic reactions like skin rash, itching or hives, swelling of the face, lips, or tongue  anxious  black, tarry stools  changes in vision  elevated mood, decreased need for sleep, racing thoughts, impulsive behavior  eye pain  fast, irregular heartbeat  feeling faint or lightheaded, falls  feeling agitated, angry, or irritable  hallucination, loss of contact with reality  painful or prolonged erections  restlessness, pacing, inability to keep still  seizures  stiff muscles  suicidal thoughts or other mood changes  unusual bleeding or bruising  unusually weak or tired  vomiting Side effects that usually do not require medical attention (report to your doctor or health care professional if they continue or are bothersome):  change in appetite or weight  change in sex drive or performance  diarrhea  increased sweating  indigestion, nausea  tremors This list may not describe all possible side effects. Call your doctor for medical advice about side effects. You may report side effects to FDA at  1-800-FDA-1088. Where should I keep my medicine? Keep out of the reach of children. Store at room temperature between 15 and 30 degrees C (59 and 86 degrees F). Throw away any unused medicine after the expiration date. NOTE: This sheet is a summary. It may not cover all possible information. If you have questions about this medicine, talk to your doctor, pharmacist, or health care provider.  2020 Elsevier/Gold Standard (2018-04-19 10:09:27)

## 2020-04-07 DIAGNOSIS — Z9181 History of falling: Secondary | ICD-10-CM

## 2020-04-07 DIAGNOSIS — I10 Essential (primary) hypertension: Secondary | ICD-10-CM | POA: Diagnosis not present

## 2020-04-07 DIAGNOSIS — Z87440 Personal history of urinary (tract) infections: Secondary | ICD-10-CM

## 2020-04-07 DIAGNOSIS — R770 Abnormality of albumin: Secondary | ICD-10-CM | POA: Diagnosis not present

## 2020-04-07 DIAGNOSIS — Z7982 Long term (current) use of aspirin: Secondary | ICD-10-CM

## 2020-04-07 DIAGNOSIS — I69318 Other symptoms and signs involving cognitive functions following cerebral infarction: Secondary | ICD-10-CM | POA: Diagnosis not present

## 2020-04-07 DIAGNOSIS — Z7984 Long term (current) use of oral hypoglycemic drugs: Secondary | ICD-10-CM

## 2020-04-07 DIAGNOSIS — E119 Type 2 diabetes mellitus without complications: Secondary | ICD-10-CM | POA: Diagnosis not present

## 2020-04-09 ENCOUNTER — Telehealth: Payer: Self-pay

## 2020-04-09 NOTE — Telephone Encounter (Signed)
Message left on clinical intake voicemail:   Kizzie Bane with [facility] called to request verbal orders for occupational therapy 1 time a week for 4 weeks. Per Dorathy Daft ok to leave a detailed message if no answer when call returned.    I called Kayla back left a detailed message: Per [employer] standing order, verbal order given. Message will be sent to patient's provider as a FYI.

## 2020-04-09 NOTE — Telephone Encounter (Signed)
I spoke with [name], patients daughter and she verbalized understanding of Dinah's reply.

## 2020-04-09 NOTE — Telephone Encounter (Signed)
Another incoming call came in from [facility] stating she was at the patients home and b/p is 152/80 in right arm sitting. Patient was told her b/p parameters were > 140/90.  Patient is not in any acute distress  Please advise

## 2020-04-09 NOTE — Telephone Encounter (Signed)
Noted  

## 2020-04-09 NOTE — Telephone Encounter (Signed)
Check Blood pressure two hours after taking blood pressure medication if still persistently > 140 for three reading will need follow up at the office.

## 2020-04-16 ENCOUNTER — Other Ambulatory Visit: Payer: Self-pay | Admitting: *Deleted

## 2020-04-16 NOTE — Patient Outreach (Signed)
[facility]) Care Management  04/16/2020  Heather [name] Nov 17, 1940 [id number]   Call placed to [facility] to confirm member is not active with palliative care program.  Informed that member was admitted to their service on 11/17.  Will close case at this time due to being active with another case management program.  Kemper Durie, RN, MSN [facility] Care Management  [facility] Manager [phone]

## 2020-04-28 ENCOUNTER — Other Ambulatory Visit: Payer: Self-pay | Admitting: Family

## 2020-04-28 DIAGNOSIS — F321 Major depressive disorder, single episode, moderate: Secondary | ICD-10-CM

## 2020-04-29 ENCOUNTER — Telehealth: Payer: Self-pay | Admitting: *Deleted

## 2020-04-29 NOTE — Telephone Encounter (Signed)
Theora Gianotti with [facility] called requesting verbal orders to continue Speech Therapy 1x4wks.  Verbal orders given.

## 2020-04-30 ENCOUNTER — Telehealth: Payer: Self-pay | Admitting: *Deleted

## 2020-04-30 NOTE — Telephone Encounter (Signed)
Heather [name] with [facility] called requesting verbal orders for OT 1x3wks.  Verbal orders given.

## 2020-05-01 ENCOUNTER — Other Ambulatory Visit: Payer: Self-pay

## 2020-05-01 ENCOUNTER — Encounter: Payer: Self-pay | Admitting: Family

## 2020-05-01 ENCOUNTER — Ambulatory Visit (INDEPENDENT_AMBULATORY_CARE_PROVIDER_SITE_OTHER): Payer: Medicare Other | Admitting: Family

## 2020-05-01 VITALS — BP 140/80 | HR 72 | Temp 97.2°F | Ht 60.0 in | Wt 137.8 lb

## 2020-05-01 DIAGNOSIS — F321 Major depressive disorder, single episode, moderate: Secondary | ICD-10-CM

## 2020-05-01 DIAGNOSIS — I10 Essential (primary) hypertension: Secondary | ICD-10-CM

## 2020-05-01 DIAGNOSIS — Z780 Asymptomatic menopausal state: Secondary | ICD-10-CM

## 2020-05-01 DIAGNOSIS — Z23 Encounter for immunization: Secondary | ICD-10-CM

## 2020-05-01 DIAGNOSIS — L853 Xerosis cutis: Secondary | ICD-10-CM

## 2020-05-01 DIAGNOSIS — I639 Cerebral infarction, unspecified: Secondary | ICD-10-CM | POA: Diagnosis not present

## 2020-05-01 DIAGNOSIS — R600 Localized edema: Secondary | ICD-10-CM | POA: Diagnosis not present

## 2020-05-01 MED ORDER — AQUAPHOR EX OINT: TOPICAL_OINTMENT | Freq: Two times a day (BID) | CUTANEOUS | 0 refills | Status: DC

## 2020-05-01 MED ORDER — TETANUS-DIPHTH-ACELL PERTUSSIS 5-2-15.5 LF-MCG/0.5 IM SUSP: 0.5000 mL | Freq: Once | INTRAMUSCULAR | 0 refills | Status: AC

## 2020-05-01 NOTE — Progress Notes (Signed)
Provider: Richarda Blade FNP-C  Deyanira Fesler, Donalee Citrin, NP  Patient Care Team: Brandolyn Shortridge, Donalee Citrin, NP as PCP - General (Family Medicine) System, Provider Not In [place] as Registered Nurse [facility] and Palliative Medicine)  Extended Emergency Contact Information Primary Emergency Contact: Gordan Payment [place] Mobile Phone: [phone] Relation: Daughter Secondary Emergency Contact: Patsy [place] Mobile Phone: [phone] Relation: Daughter  Code Status:  Full Code  Goals of care: Advanced Directive information Advanced Directives 05/01/2020  Does Patient Have a Medical Advance Directive? Yes  Type of Advance Directive Out of facility DNR (pink MOST or yellow form)  Does patient want to make changes to medical advance directive? No - Patient declined  Would patient like information on creating a medical advance directive? -  Pre-existing out of facility DNR order (yellow form or pink MOST form) Yellow form placed in chart (order not valid for inpatient use)     Chief Complaint  Patient presents with  . Follow-up    1 month follow up. Patient is following up on depression.Patient states she doesn't see a difference with taking the Zoloft.  . Best Practice Recommendations    Tetanus/Tdap, Pneumonia vaccine, COVID-19 booster  . Quality Metric Gaps    Eye exam, Dexa scan    HPI:  Pt is a 79 y.o. female seen today for an acute visit for one month follow up for depression,High blood pressure and back pain.she was here 04/02/2020 for depression reported crying whenever she was watching sad news on TV.Also had a hard time adjusting living with her daughter since she moved in due to worsening memory issues.sertraline 25 mg tablet was started.she is here with her daughter.States depression symptoms have improved with sertraline.Has not started back on her knitting.   Her Blood pressure was elevated required clonidine which came down to 158/80 on previous  visit. Blood pressure within normal today. Had lower extremities edema last visit.Knee high compression stockings were ordered but states having difficulty doning on and off the stockings so she has not been wearing them.States keeps her legs elevated when seated.she denies any cough or shortness of breath.  She is due for Tdap vaccine and Pneumococcal vaccine.Also due for Bone density.  Has had two COVID-19 vaccine but not the booster.  Also due for her annual eye exam but states was seen by eye specialist several months ago this year.states Ophthalmology office is in [place] but does not recall the name.tried to search on her phone but could not find.She will try to search at home then notify provider to update her chart.will need to obtain records.   She continues to exercise with Physical /occupational Therapy.still using walker.Does not feel safe using a cane.    Past Medical History:  Diagnosis Date  . Diabetes mellitus without complication (HCC)   . High cholesterol   . History of stroke 2021  . Hypertension    History reviewed. No pertinent surgical history.  Allergies  Allergen Reactions  . Iohexol      Desc: Shaking and sore throat, Onset Date: [id number]   . Iohexol Other (See Comments)    Reaction not recalled    Outpatient Encounter Medications as of 05/01/2020  Medication Sig  . acetaminophen (TYLENOL) 325 MG tablet Take by mouth.  Marland Kitchen amLODipine (NORVASC) 10 MG tablet Take 1 tablet (10 mg total) by mouth daily.  Marland Kitchen atorvastatin (LIPITOR) 80 MG tablet Take 1 tablet (80 mg total) by mouth daily.  Marland Kitchen losartan (COZAAR) 100 MG  tablet Take 1 tablet (100 mg total) by mouth daily.  . metFORMIN (GLUCOPHAGE) 500 MG tablet Take 1 tablet (500 mg total) by mouth 2 (two) times daily with a meal.  . metoprolol tartrate (LOPRESSOR) 25 MG tablet Take 1 tablet (25 mg total) by mouth 2 (two) times daily.  . sertraline (ZOLOFT) 25 MG tablet TAKE 1 TABLET BY MOUTH AT BEDTIME  . mineral  oil-hydrophilic petrolatum (AQUAPHOR) ointment Apply topically in the morning and at bedtime. To legs  . Tdap (ADACEL) 09-09-13.5 LF-MCG/0.5 injection Inject 0.5 mLs into the muscle once for 1 dose.  . [DISCONTINUED] cefUROXime (CEFTIN) 500 MG tablet Take 1 tablet (500 mg total) by mouth 2 (two) times daily with a meal.  . [DISCONTINUED] doxycycline (VIBRA-TABS) 100 MG tablet Take 1 tablet (100 mg total) by mouth 2 (two) times daily.  . [DISCONTINUED] Tdap (ADACEL) 09-09-13.5 LF-MCG/0.5 injection Inject 0.5 mLs into the muscle once.   No facility-administered encounter medications on file as of 05/01/2020.    Review of Systems  Constitutional: Negative for appetite change, chills, fatigue and fever.  Respiratory: Negative for cough, chest tightness, shortness of breath and wheezing.   Cardiovascular: Positive for leg swelling. Negative for chest pain and palpitations.  Gastrointestinal: Negative for abdominal distention, abdominal pain, constipation, diarrhea, nausea and vomiting.  Musculoskeletal: Positive for gait problem. Negative for joint swelling and myalgias.  Skin: Negative for color change, pallor and rash.  Neurological: Negative for dizziness, speech difficulty, light-headedness and headaches.  Psychiatric/Behavioral: Negative for agitation and behavioral problems. The patient is not nervous/anxious.        Previous depression symptoms has improved     Immunization History  Administered Date(s) Administered  . Influenza, High Dose Seasonal PF 02/05/2020  . Influenza-Unspecified 05/12/2019  . PFIZER SARS-COV-2 Vaccination 07/25/2019, 08/17/2019   Pertinent  Health Maintenance Due  Topic Date Due  . FOOT EXAM  Never done  . OPHTHALMOLOGY EXAM  Never done  . DEXA SCAN  Never done  . PNA vac Low Risk Adult (1 of 2 - PCV13) Never done  . HEMOGLOBIN A1C  08/13/2020  . INFLUENZA VACCINE  Completed   Fall Risk  04/02/2020 02/13/2020  Falls in the past year? 0 1  Number falls in  past yr: 0 0  Injury with Fall? 0 0   Functional Status Survey:    Vitals:   05/01/20 1309  BP: 140/80  Pulse: 72  Temp: (!) 97.2 F (36.2 C)  SpO2: 99%  Weight: 137 lb 12.8 oz (62.5 kg)  Height: 5' (1.524 m)   Body mass index is 26.91 kg/m. Physical Exam Vitals reviewed.  Constitutional:      General: She is not in acute distress.    Appearance: She is not ill-appearing.  HENT:     Head: Normocephalic.  Eyes:     General: No scleral icterus.       Right eye: No discharge.        Left eye: No discharge.     Extraocular Movements: Extraocular movements intact.     Conjunctiva/sclera: Conjunctivae normal.     Pupils: Pupils are equal, round, and reactive to light.  Cardiovascular:     Rate and Rhythm: Normal rate and regular rhythm.     Pulses: Normal pulses.     Heart sounds: Normal heart sounds. No murmur heard. No friction rub. No gallop.   Pulmonary:     Effort: Pulmonary effort is normal. No respiratory distress.     Breath sounds:  Normal breath sounds. No wheezing, rhonchi or rales.  Chest:     Chest wall: No tenderness.  Abdominal:     General: Bowel sounds are normal. There is no distension.     Palpations: Abdomen is soft. There is no mass.     Tenderness: There is no abdominal tenderness. There is no right CVA tenderness, left CVA tenderness, guarding or rebound.  Musculoskeletal:        General: No swelling or tenderness.     Comments: Bilateral lower extremities trace -1+ edema  Unsteady gait walks with Rolling walker.   Skin:    General: Skin is warm and dry.     Coloration: Skin is not pale.     Findings: No erythema or rash.     Comments: Bilateral lower extremities skin dry and flaky.   Neurological:     Mental Status: She is alert. Mental status is at baseline.     Cranial Nerves: No cranial nerve deficit.     Motor: Weakness present.     Gait: Gait abnormal.     Comments: Numbness   Psychiatric:        Mood and Affect: Mood normal.         Speech: Speech normal.        Behavior: Behavior normal.        Thought Content: Thought content normal.     Labs reviewed: Recent Labs    01/18/20 0228 01/19/20 0107 01/20/20 0120 01/21/20 0237 01/22/20 0112 02/13/20 1525  NA 143 139 139 138 139 140  K 3.0* 3.6 3.4* 3.7 3.5 3.6  CL 108 106 105 102 104 103  CO2 23 22 23 27 28 26   GLUCOSE 114* 102* 140* 128* 117* 108*  BUN 17 18 16 17 22 16   CREATININE 1.31* 1.23* 1.22* 1.44* 1.25* 1.31*  CALCIUM 8.8* 9.0 9.2 9.4 9.2 9.5  MG 1.9 1.7 1.8  --   --   --   PHOS 3.7 3.1 3.5  --   --   --    Recent Labs    01/20/20 0120 01/21/20 0237 01/22/20 0112 02/13/20 1525  AST 26 24 28 19   ALT 17 13 13 15   ALKPHOS 88 82 93  --   BILITOT 0.6 0.6 0.4 0.4  PROT 6.6 6.2* 6.1* 6.7  ALBUMIN 3.3* 3.0* 3.0*  --    Recent Labs    01/19/20 0107 01/20/20 0120 02/13/20 1525  WBC 6.1 6.7 6.9  NEUTROABS 3.8 3.9 4,140  HGB 14.2 15.5* 15.0  HCT 43.5 46.1* 44.2  MCV 92.8 91.7 90.0  PLT 224 251 339   Lab Results  Component Value Date   TSH 2.39 02/13/2020   Lab Results  Component Value Date   HGBA1C 6.6 (H) 02/13/2020   Lab Results  Component Value Date   CHOL 191 02/13/2020   HDL 48 (L) 02/13/2020   LDLCALC 125 (H) 02/13/2020   TRIG 85 02/13/2020   CHOLHDL 4.0 02/13/2020    Significant Diagnostic Results in last 30 days:  No results found.  Assessment/Plan 1. Current moderate episode of major depressive disorder, unspecified whether recurrent (HCC) Mood stable.has had no crying episode. - continue on sertraline 25 mg tablet daily. - Advised to notify provider if symptoms worsen will increase sertraline to 50 mg tablet daily if needed.     2. Bilateral lower extremity edema No abrupt weight gain Bilateral lower extremities trace -1 + edema.Has had difficulties donning on and off compression stockings. -  continue to keep legs elevated when seated.   3. Postmenopausal estrogen deficiency No recent bone fracture.Bone  density ordered this visit.Daughter made aware that [facility] will call for appointment.  - DG Bone Density; Future  4. Need for Tdap vaccination Advised to get Tdap Vaccine at her pharmacy.  - Tdap (ADACEL) 09-09-13.5 LF-MCG/0.5 injection; Inject 0.5 mLs into the muscle once for 1 dose.  Dispense: 0.5 mL; Refill: 0  5. Essential hypertension B/p has improved.Asymptomatic. - continue on Amlodipine,losartan and metoprolol   6. Dry skin Bilateral lower extremities skin dry and flaky. - encouraged to keep skin moist. - advised to apply Aquaphor ointment twice daily and as needed. May substitute with other skin moisturizer like Cetaphil. - mineral oil-hydrophilic petrolatum (AQUAPHOR) ointment; Apply topically in the morning and at bedtime. To legs  Dispense: 420 g; Refill: 0  7. Need for pneumococcal vaccination Afebrile. Pneumococcal vaccine 23 administered today by [facility] no reaction noted.Advised to take tylenol for any fever or generalized body aches.  - Pneumococcal polysaccharide vaccine 23-valent greater than or equal to 2yo subcutaneous/IM  Family/ staff Communication: Reviewed plan of care with patient and daughter.   Labs/tests ordered: None   Next Appointment: Has appointment 06/18/2020.   Caesar Bookmaninah C Clements Toro, NP

## 2020-05-01 NOTE — Patient Instructions (Signed)
-   continue on sertraline 25 mg tablet daily.Notify provider if symptoms worsen - Please get Tetanus Vaccine at your pharmacy  - Bone Density ordered today.[facility] will [facility] to schedule appointment.  - Continue to keep legs elevated to keep swelling down. - Apply Aquaphor ointment to legs twice daily and as needed for dry skin.

## 2020-05-22 ENCOUNTER — Telehealth: Payer: Self-pay | Admitting: Family

## 2020-05-22 NOTE — Telephone Encounter (Signed)
She will need a face to face to re-evaluate for Physical Therapy.Also depends on Insurance coverage for PT.

## 2020-05-22 NOTE — Telephone Encounter (Signed)
Heather [name] with [facility] called bc pts daughter is concerned that pt is no longer receiving PT thru [facility].  Could a referral be sent to restart PT? [facility]  [phone]  Thanks, Rosezella [place]

## 2020-05-23 ENCOUNTER — Other Ambulatory Visit: Payer: Self-pay

## 2020-05-23 ENCOUNTER — Ambulatory Visit (INDEPENDENT_AMBULATORY_CARE_PROVIDER_SITE_OTHER): Payer: Medicare Other | Admitting: Family

## 2020-05-23 ENCOUNTER — Encounter: Payer: Self-pay | Admitting: Family

## 2020-05-23 DIAGNOSIS — R29898 Other symptoms and signs involving the musculoskeletal system: Secondary | ICD-10-CM | POA: Diagnosis not present

## 2020-05-23 DIAGNOSIS — Z8673 Personal history of transient ischemic attack (TIA), and cerebral infarction without residual deficits: Secondary | ICD-10-CM | POA: Diagnosis not present

## 2020-05-23 DIAGNOSIS — R2681 Unsteadiness on feet: Secondary | ICD-10-CM

## 2020-05-23 DIAGNOSIS — R0989 Other specified symptoms and signs involving the circulatory and respiratory systems: Secondary | ICD-10-CM

## 2020-05-23 MED ORDER — VITAMIN D 125 MCG (5000 UT) PO CAPS: 1.0000 | ORAL_CAPSULE | Freq: Every day | ORAL | 0 refills | Status: AC

## 2020-05-23 MED ORDER — ASCORBIC ACID 500 MG PO TABS: 500.0000 mg | ORAL_TABLET | Freq: Two times a day (BID) | ORAL | 0 refills | Status: AC

## 2020-05-23 MED ORDER — ZINC GLUCONATE 50 MG PO TABS: 50.0000 mg | ORAL_TABLET | Freq: Every day | ORAL | 0 refills | Status: AC

## 2020-05-23 MED ORDER — LORATADINE 10 MG PO TABS: 10.0000 mg | ORAL_TABLET | Freq: Every day | ORAL | 0 refills | Status: DC

## 2020-05-23 NOTE — Progress Notes (Signed)
This service is provided via telemedicine  No vital signs collected/recorded due to the encounter was a telemedicine visit.   Location of patient (ex: home, work):  [place].  Patient consents to a telephone visit: Yes.  Location of the provider (ex: office, home):  [facility].  Name of any referring provider: Tayvian Holycross, Donalee Citrin, NP   Names of all persons participating in the telemedicine service and their role in the encounter: Patient, Heather [name] Daughter, Heather [name], RMA, Heather [name], [place], NP.    Time spent on call: 8 minutes spent on the phone with Medical Assistant.     Provider: Richarda Blade FNP-C  Kayton Dunaj, Donalee Citrin, NP  Patient Care Team: Yaritzel Stange, Donalee Citrin, NP as PCP - General (Family Medicine) System, Provider Not In [place] Registered Nurse [facility] and Pall[facility] Medicine)  Extended Emergency Contact Information Primary Emergency Contact: Heather [name] [place] Mobile Phone: [phone] Relation: Daughter Secondary Emergency Contact: Heather [name] Mobile Phone: [phone] Relation: Daughter  Code Status:  DNR Goals of care: Advanced Directive information Advanced Directives 05/23/2020  Does Patient Have a Medical Advance Directive? Yes  Type of Advance Directive Out of facility DNR (pink MOST or yellow form)  Does patient want to make changes to medical advance directive? No - Patient declined  Would patient like information on creating a medical advance directive? -  Pre-existing out of facility DNR order (yellow form or pink MOST form) Yellow form placed in chart (order not valid for inpatient use)     Chief Complaint  Patient presents with  . Acute Visit    Start Physical Therapy.    HPI:  Pt is a 80 y.o. female seen today for an acute visit for evaluation for Physical Therapy. She is seen on My chart video with assistance from her daughter Heather [name].Heather [name] with [facility] ( [facility] )  called [facility] stated daughter was concerned that patient was no longer receiving Physical therapy through [facility] need referral to restart Physical therapy.she states having trouble getting in and out of the shower due to left leg weakness.   She was noted coughing during visit states has runny nose and a nonproductive cough.Had sore throat but has resolved.Her daughter had bronchitis last week.she denies any symptoms of COVID-19 and no desire to be tested. No fever,chill,chest tightness,chest pain,palpitation or shortness of breath.    Past Medical History:  Diagnosis Date  . Diabetes mellitus without complication (HCC)   . High cholesterol   . History of stroke 2021  . Hypertension    History reviewed. No pertinent surgical history.  Allergies  Allergen Reactions  . Iohexol      Desc: Shaking and sore throat, Onset Date: [id number]   . Iohexol Other (See Comments)    Reaction not recalled    Outpatient Encounter Medications as of 05/23/2020  Medication Sig  . acetaminophen (TYLENOL) 325 MG tablet Take by mouth.  Marland Kitchen atorvastatin (LIPITOR) 80 MG tablet Take 1 tablet (80 mg total) by mouth daily.  . metFORMIN (GLUCOPHAGE) 500 MG tablet Take 1 tablet (500 mg total) by mouth 2 (two) times daily with a meal.  . mineral oil-hydrophilic petrolatum (AQUAPHOR) ointment Apply topically in the morning and at bedtime. To legs  . sertraline (ZOLOFT) 25 MG tablet TAKE 1 TABLET BY MOUTH AT BEDTIME  . amLODipine (NORVASC) 10 MG tablet Take 1 tablet (10 mg total) by mouth daily.  Marland Kitchen losartan (COZAAR) 100 MG tablet Take 1  tablet (100 mg total) by mouth daily.  . metoprolol tartrate (LOPRESSOR) 25 MG tablet Take 1 tablet (25 mg total) by mouth 2 (two) times daily.   No facility-administered encounter medications on file as of 05/23/2020.    Review of Systems  Constitutional: Negative for appetite change, chills, fatigue and fever.  HENT: Negative for congestion, rhinorrhea, sinus pressure,  sinus pain, sneezing and sore throat.   Respiratory: Positive for cough. Negative for chest tightness, shortness of breath and wheezing.   Cardiovascular: Negative for chest pain, palpitations and leg swelling.  Gastrointestinal: Negative for abdominal distention, abdominal pain, constipation, diarrhea, nausea and vomiting.  Endocrine: Negative for cold intolerance, heat intolerance, polydipsia, polyphagia and polyuria.  Genitourinary: Negative for difficulty urinating, dysuria, flank pain, frequency and urgency.  Musculoskeletal: Positive for gait problem. Negative for arthralgias, back pain and joint swelling.  Skin: Negative for color change, pallor and rash.  Neurological: Negative for dizziness, speech difficulty, light-headedness and headaches.       Left leg weakness  Hematological: Does not bruise/bleed easily.  Psychiatric/Behavioral: Negative for agitation, behavioral problems and confusion. The patient is not nervous/anxious.     Immunization History  Administered Date(s) Administered  . Influenza, High Dose Seasonal PF 02/05/2020  . Influenza-Unspecified 05/12/2019  . PFIZER SARS-COV-2 Vaccination 07/25/2019, 08/17/2019  . Pneumococcal Polysaccharide-23 05/01/2020   Pertinent  Health Maintenance Due  Topic Date Due  . FOOT EXAM  Never done  . OPHTHALMOLOGY EXAM  Never done  . URINE MICROALBUMIN  Never done  . DEXA SCAN  Never done  . HEMOGLOBIN A1C  08/13/2020  . PNA vac Low Risk Adult (2 of 2 - PCV13) 05/01/2021  . INFLUENZA VACCINE  Completed   Fall Risk  05/23/2020 04/02/2020 02/13/2020  Falls in the past year? 0 0 1  Number falls in past yr: 0 0 0  Injury with Fall? 0 0 0   Functional Status Survey:    There were no vitals filed for this visit. There is no height or weight on file to calculate BMI. Physical Exam Constitutional:      General: She is not in acute distress.    Appearance: She is not ill-appearing.  Pulmonary:     Effort: Pulmonary effort is  normal. No respiratory distress.  Neurological:     Mental Status: She is alert and oriented to person, place, and time.     Gait: Gait abnormal.  Psychiatric:        Mood and Affect: Mood normal.        Behavior: Behavior normal.        Thought Content: Thought content normal.     Labs reviewed: Recent Labs    01/18/20 0228 01/19/20 0107 01/20/20 0120 01/21/20 0237 01/22/20 0112 02/13/20 1525  NA 143 139 139 138 139 140  K 3.0* 3.6 3.4* 3.7 3.5 3.6  CL 108 106 105 102 104 103  CO2 23 22 23 27 28 26   GLUCOSE 114* 102* 140* 128* 117* 108*  BUN 17 18 16 17 22 16   CREATININE 1.31* 1.23* 1.22* 1.44* 1.25* 1.31*  CALCIUM 8.8* 9.0 9.2 9.4 9.2 9.5  MG 1.9 1.7 1.8  --   --   --   PHOS 3.7 3.1 3.5  --   --   --    Recent Labs    01/20/20 0120 01/21/20 0237 01/22/20 0112 02/13/20 1525  AST 26 24 28 19   ALT 17 13 13 15   ALKPHOS 88 82 93  --  BILITOT 0.6 0.6 0.4 0.4  PROT 6.6 6.2* 6.1* 6.7  ALBUMIN 3.3* 3.0* 3.0*  --    Recent Labs    01/19/20 0107 01/20/20 0120 02/13/20 1525  WBC 6.1 6.7 6.9  NEUTROABS 3.8 3.9 4,140  HGB 14.2 15.5* 15.0  HCT 43.5 46.1* 44.2  MCV 92.8 91.7 90.0  PLT 224 251 339   Lab Results  Component Value Date   TSH 2.39 02/13/2020   Lab Results  Component Value Date   HGBA1C 6.6 (H) 02/13/2020   Lab Results  Component Value Date   CHOL 191 02/13/2020   HDL 48 (L) 02/13/2020   LDLCALC 125 (H) 02/13/2020   TRIG 85 02/13/2020   CHOLHDL 4.0 02/13/2020    Significant Diagnostic Results in last 30 days:  No results found.  Assessment/Plan 1. Unsteady gait Ambulates with a walker.will need PT for gait stability,ROM,exercise and muscle strengthening.  Fall and safety precautions. - Ambulatory referral to Home Health  2. History of stroke Left side weakness. - Ambulatory referral to Home Health  3. Left leg weakness Having difficulties getting in and out of the shower.PT to evaluate  - Ambulatory referral to Home Health  4.  Symptoms of upper respiratory infection (URI) Noted coughing during visit.daughter had bronchitis recently.declines COVID-19 testing. - Cholecalciferol (VITAMIN D) 125 MCG (5000 UT) CAPS; Take 1 capsule by mouth daily for 14 days.  Dispense: 14 capsule; Refill: 0 - vitamin C (VITAMIN C) 500 MG tablet; Take 1 tablet (500 mg total) by mouth 2 (two) times daily for 14 days.  Dispense: 28 tablet; Refill: 0 - zinc gluconate 50 MG tablet; Take 1 tablet (50 mg total) by mouth daily for 14 days.  Dispense: 14 tablet; Refill: 0 - loratadine (CLARITIN) 10 MG tablet; Take 1 tablet (10 mg total) by mouth daily for 14 days.  Dispense: 14 tablet; Refill: 0  Family/ staff Communication: Reviewed plan of care with patient and daughter   Labs/tests ordered: None    Next Appointment: As needed if symptoms worsen or fail to improve  I connected with  Heather [name] on 05/27/20 by a video enabled telemedicine application and verified that I am speaking with the correct person using two identifiers.   I discussed the limitations of evaluation and management by telemedicine. The patient expressed understanding and agreed to proceed.  Spent 15 minutes of face to face on My chart video with patient and daughter.   Caesar Bookman, NP

## 2020-05-23 NOTE — Patient Instructions (Signed)
-   Physical Therapy ordered this visit Agency will call you for appointment.  - Take vitamin C 500 mg tablet one by mouth daily x 14 days  - Vitamin D 5000 units one by mouth daily x 14 days  - Zinc 50 mg tablet one by mouth x 14 days  - Loratadine 10 mg tablet one by mouth daily for runny nose.

## 2020-06-16 ENCOUNTER — Other Ambulatory Visit: Payer: Self-pay | Admitting: Family

## 2020-06-16 DIAGNOSIS — I10 Essential (primary) hypertension: Secondary | ICD-10-CM

## 2020-06-16 DIAGNOSIS — E119 Type 2 diabetes mellitus without complications: Secondary | ICD-10-CM

## 2020-06-18 ENCOUNTER — Other Ambulatory Visit: Payer: Self-pay

## 2020-06-18 ENCOUNTER — Encounter: Payer: Self-pay | Admitting: Family

## 2020-06-18 ENCOUNTER — Ambulatory Visit (INDEPENDENT_AMBULATORY_CARE_PROVIDER_SITE_OTHER): Payer: Medicare Other | Admitting: Family

## 2020-06-18 VITALS — BP 138/60 | HR 60 | Temp 96.9°F | Resp 16 | Ht 60.0 in | Wt 138.8 lb

## 2020-06-18 DIAGNOSIS — E119 Type 2 diabetes mellitus without complications: Secondary | ICD-10-CM | POA: Diagnosis not present

## 2020-06-18 DIAGNOSIS — E785 Hyperlipidemia, unspecified: Secondary | ICD-10-CM | POA: Diagnosis not present

## 2020-06-18 DIAGNOSIS — I10 Essential (primary) hypertension: Secondary | ICD-10-CM

## 2020-06-18 DIAGNOSIS — E663 Overweight: Secondary | ICD-10-CM

## 2020-06-18 DIAGNOSIS — F321 Major depressive disorder, single episode, moderate: Secondary | ICD-10-CM

## 2020-06-18 DIAGNOSIS — R399 Unspecified symptoms and signs involving the genitourinary system: Secondary | ICD-10-CM | POA: Diagnosis not present

## 2020-06-18 DIAGNOSIS — Z6827 Body mass index (BMI) 27.0-27.9, adult: Secondary | ICD-10-CM

## 2020-06-18 DIAGNOSIS — Z66 Do not resuscitate: Secondary | ICD-10-CM

## 2020-06-18 DIAGNOSIS — R600 Localized edema: Secondary | ICD-10-CM

## 2020-06-18 DIAGNOSIS — Z23 Encounter for immunization: Secondary | ICD-10-CM

## 2020-06-18 DIAGNOSIS — R2681 Unsteadiness on feet: Secondary | ICD-10-CM

## 2020-06-18 LAB — POCT URINALYSIS DIPSTICK
Bilirubin, UA: NEGATIVE
Glucose, UA: NEGATIVE
Nitrite, UA: POSITIVE
Protein, UA: POSITIVE — AB
Spec Grav, UA: 1.01 (ref 1.010–1.025)
Urobilinogen, UA: NEGATIVE E.U./dL — AB
pH, UA: 6.5 (ref 5.0–8.0)

## 2020-06-18 MED ORDER — TETANUS-DIPHTH-ACELL PERTUSSIS 5-2-15.5 LF-MCG/0.5 IM SUSP: 0.5000 mL | Freq: Once | INTRAMUSCULAR | 0 refills | Status: AC

## 2020-06-18 NOTE — Patient Instructions (Addendum)
- continue current medication   - labs done today will call you with results.   - Urine specimen send for culture will call you in 3 days with results.Increase water intake to at least 6- 8 glasses daily    Urinary Tract Infection, Adult  A urinary tract infection (UTI) is an infection of any part of the urinary tract. The urinary tract includes the kidneys, ureters, bladder, and urethra. These organs make, store, and get rid of urine in the body. An upper UTI affects the ureters and kidneys. A lower UTI affects the bladder and urethra. What are the causes? Most urinary tract infections are caused by bacteria in your genital area around your urethra, where urine leaves your body. These bacteria grow and cause inflammation of your urinary tract. What increases the risk? You are more likely to develop this condition if:  You have a urinary catheter that stays in place.  You are not able to control when you urinate or have a bowel movement (incontinence).  You are female and you: ? Use a spermicide or diaphragm for birth control. ? Have low estrogen levels. ? Are pregnant.  You have certain genes that increase your risk.  You are sexually active.  You take antibiotic medicines.  You have a condition that causes your flow of urine to slow down, such as: ? An enlarged prostate, if you are female. ? Blockage in your urethra. ? A kidney stone. ? A nerve condition that affects your bladder control (neurogenic bladder). ? Not getting enough to drink, or not urinating often.  You have certain medical conditions, such as: ? Diabetes. ? A weak disease-fighting system (immunesystem). ? Sickle cell disease. ? Gout. ? Spinal cord injury. What are the signs or symptoms? Symptoms of this condition include:  Needing to urinate right away (urgency).  Frequent urination. This may include small amounts of urine each time you urinate.  Pain or burning with urination.  Blood in the  urine.  Urine that smells bad or unusual.  Trouble urinating.  Cloudy urine.  Vaginal discharge, if you are female.  Pain in the abdomen or the lower back. You may also have:  Vomiting or a decreased appetite.  Confusion.  Irritability or tiredness.  A fever or chills.  Diarrhea. The first symptom in older adults may be confusion. In some cases, they may not have any symptoms until the infection has worsened. How is this diagnosed? This condition is diagnosed based on your medical history and a physical exam. You may also have other tests, including:  Urine tests.  Blood tests.  Tests for STIs (sexually transmitted infections). If you have had more than one UTI, a cystoscopy or imaging studies may be done to determine the cause of the infections. How is this treated? Treatment for this condition includes:  Antibiotic medicine.  Over-the-counter medicines to treat discomfort.  Drinking enough water to stay hydrated. If you have frequent infections or have other conditions such as a kidney stone, you may need to see a health care provider who specializes in the urinary tract (urologist). In rare cases, urinary tract infections can cause sepsis. Sepsis is a life-threatening condition that occurs when the body responds to an infection. Sepsis is treated in the hospital with IV antibiotics, fluids, and other medicines. Follow these instructions at home: Medicines  Take over-the-counter and prescription medicines only as told by your health care provider.  If you were prescribed an antibiotic medicine, take it as told by your  health care provider. Do not stop using the antibiotic even if you start to feel better. General instructions  Make sure you: ? Empty your bladder often and completely. Do not hold urine for long periods of time. ? Empty your bladder after sex. ? Wipe from front to back after urinating or having a bowel movement if you are female. Use each tissue  only one time when you wipe.  Drink enough fluid to keep your urine pale yellow.  Keep all follow-up visits. This is important.   Contact a health care provider if:  Your symptoms do not get better after 1-2 days.  Your symptoms go away and then return. Get help right away if:  You have severe pain in your back or your lower abdomen.  You have a fever or chills.  You have nausea or vomiting. Summary  A urinary tract infection (UTI) is an infection of any part of the urinary tract, which includes the kidneys, ureters, bladder, and urethra.  Most urinary tract infections are caused by bacteria in your genital area.  Treatment for this condition often includes antibiotic medicines.  If you were prescribed an antibiotic medicine, take it as told by your health care provider. Do not stop using the antibiotic even if you start to feel better.  Keep all follow-up visits. This is important. This information is not intended to replace advice given to you by your health care provider. Make sure you discuss any questions you have with your health care provider. Document Revised: 12/08/2019 Document Reviewed: 12/08/2019 Elsevier Patient Education  2021 [employer].

## 2020-06-18 NOTE — Progress Notes (Signed)
Provider: Marlowe Sax FNP-C   Wilburn Keir, Nelda Bucks, NP  Patient Care Team: Myron Lona, Nelda Bucks, NP as PCP - General (Family Medicine) System, Provider Not In [place] Registered Nurse [facility])  Extended Emergency Contact Information Primary Emergency Contact: Christiana of [place] Mobile Phone: [phone] Relation: Daughter Secondary Emergency Contact: Carson Myrtle Mobile Phone: [phone] Relation: Daughter  Code Status: DNR Goals of care: Advanced Directive information Advanced Directives 06/18/2020  Does Patient Have a Medical Advance Directive? Yes  Type of Advance Directive Out of facility DNR (pink MOST or yellow form)  Does patient want to make changes to medical advance directive? No - Patient declined  Would patient like information on creating a medical advance directive? -  Pre-existing out of facility DNR order (yellow form or pink MOST form) -     Chief Complaint  Patient presents with  . Medical Management of Chronic Issues    4 month follow up  . Health Maintenance    Discuss the need for Foot exam, Eye exam, and Dexa scan.  . Immunizations    Discuss the need for Tetanus Vaccine, and Covid Booster.   . Concern    Moderate Fall Risk.    HPI:  Pt is a 80 y.o. female seen today for medical management of chronic diseases.She is here with her daughter.she denies any acute issues today.Daughter states patient's urine ha a very strong odor thinks she has a urinary tract infection.Not drinking enough water.she denies any fever,chills,urgency,dysuria,lower abdominal/back pain.  Type 2 DM - Checks her CBG at home did not bring log today but states readings ranges in the 110's- 140's. She denies any signs of hypoglycemia. Has been taking metformin 500 mg tablet daily.Her previous Hgb A1C 6.6 slightly higher than previous 6.4  Has upcoming appointment with Ophthalmology will update [facility] provider when seen by  Ophthalmology.Podiatry referral also in place.   Hypertension - on losartan 100 mg tablet daily.reports no side effects.No home blood pressure readings for review.  Hyperlipidemia - eat fast food sometimes.on atorvastatin 80 mg tablet.denies any muscle weakness or aches.previous LDL 125,TRG 85,and chol 191.   Depression - on sertraline 25 mg tablet daily.Daughter states has been doing well on medication.no crying episodes.  Bilateral leg edema - swelling on the legs has not worsen but persist.States has several pairs of knee high stockings but unable to don stocking on and off. Tries to keep legs elevated. She denies any cough, shortness of breath or abrupt weight gain.  Weight stable this visit.  [place] [address] one week when her left ankle turned.states was not using her walker when she tried to turn.denies hitting her head or loss of consciousness.Also denies any pain.   COVID-19 vaccine - has had x 2 Pfizer vaccine.states went to the pharmacy to get booster shot but was told to return on Monday 06/21/2020 pfizer vaccine was out  Of order.   Tdap vaccine - script send to her pharmacy in previous visit but states did not get Tdap vaccine.Advised to get Tdap vaccine at her pharmacy.will send another script.  She is due for fasting lab work today.daughter states unable to bring her only for fasting labs since they live far and gas has been very expensive.Has another doctor's appointment at 1:45 pm next week but patient cannot fast for a long time due to being diabetic.will get non-fasting labs today. Last eat a sandwich 4 hrs ago.   Code status - DNR order in chart.  Past Medical History:  Diagnosis Date  . Diabetes mellitus without complication ([place])   . High cholesterol   . History of stroke 2021  . Hypertension    History reviewed. No pertinent surgical history.  Allergies  Allergen Reactions  . Iohexol      Desc: Shaking and sore throat, Onset Date: 85631497   . Iohexol Other (See  Comments)    Reaction not recalled    Allergies as of 06/18/2020      Reactions   Iohexol     Desc: Shaking and sore throat, Onset Date: [id number]   Iohexol Other (See Comments)   Reaction not recalled      Medication List       Accurate as of June 18, 2020  3:36 PM. If you have any questions, ask your nurse or doctor.        STOP taking these medications   amLODipine 10 MG tablet Commonly known as: NORVASC Stopped by: Sandrea Hughs, NP   loratadine 10 MG tablet Commonly known as: CLARITIN Stopped by: Sandrea Hughs, NP   metoprolol tartrate 25 MG tablet Commonly known as: LOPRESSOR Stopped by: Sandrea Hughs, NP     TAKE these medications   acetaminophen 325 MG tablet Commonly known as: TYLENOL Take by mouth.   atorvastatin 80 MG tablet Commonly known as: LIPITOR Take 1 tablet (80 mg total) by mouth daily.   losartan 100 MG tablet Commonly known as: COZAAR Take 1 tablet by mouth once daily   metFORMIN 500 MG tablet Commonly known as: GLUCOPHAGE Take 1 tablet by mouth once daily   mineral oil-hydrophilic petrolatum ointment Apply topically in the morning and at bedtime. To legs   sertraline 25 MG tablet Commonly known as: ZOLOFT TAKE 1 TABLET BY MOUTH AT BEDTIME   Tdap 09-09-13.5 LF-MCG/0.5 injection Commonly known as: ADACEL Inject 0.5 mLs into the muscle once for 1 dose.       Review of Systems  Constitutional: Negative for appetite change, chills, fatigue and fever.  HENT: Negative for congestion, rhinorrhea, sinus pressure, sinus pain, sneezing and sore throat.   Eyes: Positive for visual disturbance. Negative for discharge, redness and itching.       Has upcoming appointment with Ophthalmology   Respiratory: Negative for cough, chest tightness, shortness of breath and wheezing.   Cardiovascular: Positive for leg swelling. Negative for chest pain and palpitations.  Gastrointestinal: Negative for abdominal distention, abdominal pain,  constipation, diarrhea, nausea and vomiting.  Endocrine: Negative for cold intolerance, heat intolerance, polydipsia, polyphagia and polyuria.  Genitourinary: Positive for frequency. Negative for difficulty urinating, dysuria, flank pain and urgency.       Strong urine odor   Musculoskeletal: Positive for arthralgias and gait problem. Negative for joint swelling and myalgias.  Skin: Negative for color change, pallor and rash.  Neurological: Negative for dizziness, speech difficulty, light-headedness, numbness and headaches.       Left leg weakness   Psychiatric/Behavioral: Negative for agitation, confusion and sleep disturbance. The patient is not nervous/anxious.        Depression has improved on sertraline     Immunization History  Administered Date(s) Administered  . Influenza, High Dose Seasonal PF 02/05/2020  . Influenza-Unspecified 05/12/2019  . PFIZER(Purple Top)SARS-COV-2 Vaccination 07/25/2019, 08/17/2019  . Pneumococcal Polysaccharide-23 05/01/2020   Pertinent  Health Maintenance Due  Topic Date Due  . FOOT EXAM  Never done  . OPHTHALMOLOGY EXAM  Never done  . DEXA SCAN  Never done  .  HEMOGLOBIN A1C  08/13/2020  . PNA vac Low Risk Adult (2 of 2 - PCV13) 05/01/2021  . INFLUENZA VACCINE  Completed   Fall Risk  06/18/2020 05/23/2020 04/02/2020 02/13/2020  Falls in the past year? 1 0 0 1  Number falls in past yr: 1 0 0 0  Injury with Fall? 0 0 0 0   Functional Status Survey:    Vitals:   06/18/20 1326  BP: 138/60  Pulse: 60  Resp: 16  Temp: (!) 96.9 F (36.1 C)  SpO2: 98%  Weight: 138 lb 12.8 oz (63 kg)  Height: 5' (1.524 m)   Body mass index is 27.11 kg/m. Physical Exam Vitals reviewed.  Constitutional:      General: She is not in acute distress.    Appearance: She is overweight. She is not ill-appearing.  HENT:     Head: Normocephalic.     Right Ear: Tympanic membrane, ear canal and external ear normal. There is no impacted cerumen.     Left Ear: Tympanic  membrane, ear canal and external ear normal. There is no impacted cerumen.     Nose: Nose normal. No congestion or rhinorrhea.     Mouth/Throat:     Mouth: Mucous membranes are moist.     Pharynx: Oropharynx is clear. No oropharyngeal exudate or posterior oropharyngeal erythema.  Eyes:     General: No scleral icterus.       Right eye: No discharge.        Left eye: No discharge.     Extraocular Movements: Extraocular movements intact.     Conjunctiva/sclera: Conjunctivae normal.     Pupils: Pupils are equal, round, and reactive to light.  Neck:     Vascular: No carotid bruit.  Cardiovascular:     Rate and Rhythm: Normal rate and regular rhythm.     Pulses: Normal pulses.     Heart sounds: Normal heart sounds. No murmur heard. No friction rub. No gallop.   Pulmonary:     Effort: Pulmonary effort is normal. No respiratory distress.     Breath sounds: Normal breath sounds. No wheezing, rhonchi or rales.  Chest:     Chest wall: No tenderness.  Abdominal:     General: Bowel sounds are normal. There is no distension.     Palpations: Abdomen is soft. There is no mass.     Tenderness: There is no abdominal tenderness. There is no right CVA tenderness, left CVA tenderness, guarding or rebound.  Musculoskeletal:        General: No swelling or tenderness.     Cervical back: Normal range of motion. No rigidity or tenderness.     Comments: Unsteady gait ambulates with a Front wheel walker. Bilateral lower extremities trace-1+ edema.   Lymphadenopathy:     Cervical: No cervical adenopathy.  Skin:    General: Skin is warm and dry.     Coloration: Skin is not pale.     Findings: No bruising, erythema, lesion or rash.  Neurological:     Mental Status: She is alert and oriented to person, place, and time.     Cranial Nerves: No cranial nerve deficit.     Sensory: No sensory deficit.     Coordination: Coordination normal.     Gait: Gait abnormal.     Comments: Left leg weakness    Psychiatric:        Mood and Affect: Mood normal.        Behavior: Behavior normal.  Thought Content: Thought content normal.        Judgment: Judgment normal.     Comments: Mild dyarthria     Labs reviewed: Recent Labs    01/18/20 0228 01/19/20 0107 01/20/20 0120 01/21/20 0237 01/22/20 0112 02/13/20 1525  NA 143 139 139 138 139 140  K 3.0* 3.6 3.4* 3.7 3.5 3.6  CL 108 106 105 102 104 103  CO2 _0 GLUCOSE 114* 102* 140* 128* 117* 108*  BUN _1 CREATININE 1.31* 1.23* 1.22* 1.44* 1.25* 1.31*  CALCIUM 8.8* 9.0 9.2 9.4 9.2 9.5  MG 1.9 1.7 1.8  --   --   --   PHOS 3.7 3.1 3.5  --   --   --    Recent Labs    01/20/20 0120 01/21/20 0237 01/22/20 0112 02/13/20 1525  AST _2 ALT _3 ALKPHOS 88 82 93  --   BILITOT 0.6 0.6 0.4 0.4  PROT 6.6 6.2* 6.1* 6.7  ALBUMIN 3.3* 3.0* 3.0*  --    Recent Labs    01/19/20 0107 01/20/20 0120 02/13/20 1525  WBC 6.1 6.7 6.9  NEUTROABS 3.8 3.9 4,140  HGB 14.2 15.5* 15.0  HCT 43.5 46.1* 44.2  MCV 92.8 91.7 90.0  PLT 224 251 339   Lab Results  Component Value Date   TSH 2.39 02/13/2020   Lab Results  Component Value Date   HGBA1C 6.6 (H) 02/13/2020   Lab Results  Component Value Date   CHOL 191 02/13/2020   HDL 48 (L) 02/13/2020   LDLCALC 125 (H) 02/13/2020   TRIG 85 02/13/2020   CHOLHDL 4.0 02/13/2020    Significant Diagnostic Results in last 30 days:  No results found.  Assessment/Plan 1. Type 2 diabetes mellitus without complication, without long-term current use of insulin (HCC) Lab Results  Component Value Date   HGBA1C 6.6 (H) 02/13/2020  CBG controlled in the 110's-140's. - continue on metformin 500 mg tablet daily. - awaiting annual eye and foot exam appointment - continue on Statin and ARB - TSH - Hemoglobin A1c  2. Hyperlipidemia LDL goal <100 LDL not at goal. - continue on Atorvastatin 80 mg tablet daily. - dietary modification advised and  exercise as tolerated.  - non fasting labs today  - Lipid Panel  3. Essential hypertension B/p well controlled. - continue on losartan 100 mg tablet daily.  - continue on Statin.  - CBC with Differential/Platelet - CMP with eGFR([facility]) - TSH  4. Current moderate episode of major depressive disorder, unspecified whether recurrent (HCC) Mood is stable.No crying episodes reported.  - will continue on sertraline 25 mg tablet daily.will increase dosage if symptoms worsen.  - TSH  5. Symptoms of urinary tract infection Daughter reports very strong urine odor. - encouraged to increase water intake to at least 6- 8 glasses daily. - POC Urinalysis Dipstick indicates light yellow cloudy urine with small ketones,large blood , large leukocytes ,positve for protein and Nitrites. Will send for urine culture.made aware culture will return in 3 days.Advised to notify provider for any fever,chills ,or worsening symptoms.   - Urine Culture  6. Need for Tdap vaccination Will resend another script to pharmacy.Advised to get TDap vaccine at her pharmacy - Tdap (ADACEL) 09-09-13.5 LF-MCG/0.5 injection; Inject 0.5 mLs into the muscle once for 1 dose.  Dispense: 0.5 mL; Refill: 0  7. DNR (do not resuscitate) DNR order in  chart - Do not attempt resuscitation (DNR)  8. Unsteady gait Reports fall one week ago without any injury when left ankle unable to turn.was not using her walker. - Fall and safety precaution advised. - Advised to use her walker all the time for ambulation.   9. Bilateral lower extremity edema Bilateral lower extremities trace -1+ edema. - continue to elevate legs whenever seated. - Has Knee high compression stockings but having difficulties donning on and off.   10. Overweight with body mass index (BMI) 25.0-29.9 BMI 27.11  - dietary modification advised. Exercise limited due to left leg weakness due to hx of stroke.   11. Body mass index 27.0-27.9, adult Dietary modification  and activity as tolerated.   Family/ staff Communication: Reviewed plan of care with patient and daughter verbalized understanding.   Labs/tests ordered:  - CBC with Differential/Platelet - CMP with eGFR([facility]) - TSH - Hemoglobin A1C - Lipid Panel - Urine Culture  Next Appointment : 6 months for medical management of chronic issues   Sandrea Hughs, NP

## 2020-06-20 LAB — CBC WITH DIFFERENTIAL/PLATELET
Absolute Monocytes: 634 cells/uL (ref 200–950)
Basophils Absolute: 50 cells/uL (ref 0–200)
Basophils Relative: 0.7 %
Eosinophils Absolute: 569 cells/uL — ABNORMAL HIGH (ref 15–500)
Eosinophils Relative: 7.9 %
HCT: 41.2 % (ref 35.0–45.0)
Hemoglobin: 13.8 g/dL (ref 11.7–15.5)
Lymphs Abs: 1642 cells/uL (ref 850–3900)
MCH: 31.2 pg (ref 27.0–33.0)
MCHC: 33.5 g/dL (ref 32.0–36.0)
MCV: 93.2 fL (ref 80.0–100.0)
MPV: 12.1 fL (ref 7.5–12.5)
Monocytes Relative: 8.8 %
Neutro Abs: 4306 cells/uL (ref 1500–7800)
Neutrophils Relative %: 59.8 %
Platelets: 296 10*3/uL (ref 140–400)
RBC: 4.42 10*6/uL (ref 3.80–5.10)
RDW: 13.1 % (ref 11.0–15.0)
Total Lymphocyte: 22.8 %
WBC: 7.2 10*3/uL (ref 3.8–10.8)

## 2020-06-20 LAB — COMPLETE METABOLIC PANEL WITH GFR
AG Ratio: 1.5 (calc) (ref 1.0–2.5)
ALT: 99 U/L — ABNORMAL HIGH (ref 6–29)
AST: 76 U/L — ABNORMAL HIGH (ref 10–35)
Albumin: 4.1 g/dL (ref 3.6–5.1)
Alkaline phosphatase (APISO): 402 U/L — ABNORMAL HIGH (ref 37–153)
BUN/Creatinine Ratio: 18 (calc) (ref 6–22)
BUN: 22 mg/dL (ref 7–25)
CO2: 31 mmol/L (ref 20–32)
Calcium: 9.9 mg/dL (ref 8.6–10.4)
Chloride: 102 mmol/L (ref 98–110)
Creat: 1.23 mg/dL — ABNORMAL HIGH (ref 0.60–0.93)
GFR, Est African American: 49 mL/min/{1.73_m2} — ABNORMAL LOW (ref >=60)
GFR, Est Non African American: 42 mL/min/{1.73_m2} — ABNORMAL LOW (ref >=60)
Globulin: 2.7 g/dL (calc) (ref 1.9–3.7)
Glucose, Bld: 151 mg/dL — ABNORMAL HIGH (ref 65–99)
Potassium: 3.9 mmol/L (ref 3.5–5.3)
Sodium: 140 mmol/L (ref 135–146)
Total Bilirubin: 0.4 mg/dL (ref 0.2–1.2)
Total Protein: 6.8 g/dL (ref 6.1–8.1)

## 2020-06-20 LAB — LIPID PANEL
Cholesterol: 176 mg/dL (ref <200)
HDL: 47 mg/dL — ABNORMAL LOW (ref >=50)
LDL Cholesterol (Calc): 101 mg/dL (calc) — ABNORMAL HIGH
Non-HDL Cholesterol (Calc): 129 mg/dL (calc) (ref <130)
Total CHOL/HDL Ratio: 3.7 (calc) (ref <5.0)
Triglycerides: 181 mg/dL — ABNORMAL HIGH (ref <150)

## 2020-06-20 LAB — HEMOGLOBIN A1C
Hgb A1c MFr Bld: 6.3 % of total Hgb — ABNORMAL HIGH (ref <5.7)
Mean Plasma Glucose: 134 mg/dL
eAG (mmol/L): 7.4 mmol/L

## 2020-06-20 LAB — URINE CULTURE
MICRO NUMBER:: 11509254
SPECIMEN QUALITY:: ADEQUATE

## 2020-06-20 LAB — TSH: TSH: 1.79 mIU/L (ref 0.40–4.50)

## 2020-06-21 ENCOUNTER — Other Ambulatory Visit: Payer: Self-pay

## 2020-06-21 DIAGNOSIS — R748 Abnormal levels of other serum enzymes: Secondary | ICD-10-CM

## 2020-06-21 MED ORDER — CIPROFLOXACIN HCL 500 MG PO TABS: 500.0000 mg | ORAL_TABLET | Freq: Two times a day (BID) | ORAL | 0 refills | Status: DC

## 2020-06-26 ENCOUNTER — Encounter: Payer: Self-pay | Admitting: Adult Health

## 2020-06-26 ENCOUNTER — Other Ambulatory Visit: Payer: Self-pay

## 2020-06-26 ENCOUNTER — Ambulatory Visit (INDEPENDENT_AMBULATORY_CARE_PROVIDER_SITE_OTHER): Payer: Medicare Other | Admitting: Adult Health

## 2020-06-26 VITALS — BP 167/73 | HR 57 | Ht 60.0 in | Wt 143.0 lb

## 2020-06-26 DIAGNOSIS — I639 Cerebral infarction, unspecified: Secondary | ICD-10-CM | POA: Diagnosis not present

## 2020-06-26 NOTE — Progress Notes (Signed)
[facility] [address] [place]. [place] [address] [phone]       STROKE FOLLOW UP NOTE  [name] Date of Birth:  1940/10/31 Medical Record Number:  [id number]   Reason for Referral: stroke follow up    SUBJECTIVE:   CHIEF COMPLAINT:  Chief Complaint  Patient presents with  . Follow-up    TR with daughter  Heather [name]) Pt is well, no symptoms     HPI:   Today, 06/26/2020, [name] returns for stroke follow-up accompanied by her daughter, Heather [name], after prior visit 4 months ago.  She has been stable since prior visit without new stroke/TIA symptoms and reports residual cognitive impairment, LLE weakness and gait impairment.  She continues to live with her daughter but is able to manage majority of ADLs independently.  Reports compliance with aspirin 81 mg daily without bleeding or bruising.  Previously on atorvastatin 80 mg daily but recent lab work showed elevated LFTs therefore dosage decreased to 40 mg daily.  Recent lipid panel LDL 101 (down from 125).  Recent A1c 6.3 (down from 6.6).  Blood pressure today elevated at 167/73.  Reports elevated readings at home over the past week.  No further concerns at this time.    History provided for reference purposes only Initial visit 02/21/2020 JM: [name] is being seen for hospital follow-up accompanied by her daughter.  Per patient, she does not have any residual deficits or symptoms.  Reports working with home health therapy and when asked what they are working with her on she states " nothing really".  She is ambulating with a rolling walker which she was not using previously.  She is currently residing with her older daughter not present at today's visit.  She was previously living in her own apartment independently. Per daughter, she continues to have cognitive impairment, slurred speech, upper extremity shaking, weakness, hand numbness and gait impairment.  She is able to dress independently  but per daughter, has difficulty getting her to bathe routinely.  Denies new or worsening stroke/TIA symptoms.  She has completed 3 weeks DAPT and remains on aspirin alone without bleeding or bruising.  Recently established care with PCP who discontinued Crestor and initiated atorvastatin 80 mg daily.  She appears to be tolerating well without side effects.  Blood pressure today 181/74 and on recheck 170/78.  She reports monitoring at home and similar to today's reading.  They have not been called to complete cardiac event monitor at this time.  No further concerns.  [name] is a 80 y.o. female w/pmh of HTN, HLD, diabetes, medical non compliance who presents with confusion and gait difficulty. Her gait difficulty started on 01/12/20 and confusion started on 01/16/20. She was taken to an OSH on Tuesday where she was diagnosed with a UTI + CTH was done that revealed and old stroke in her brain. She was sent home on oral abx and presented to [facility] on 01/17/20 due to continued confusion and gait difficulty noted to be swerving to the left side while walking.  Personally reviewed recent hospitalization pertinent progress notes, lab work and imaging with summary provided.  Evaluated by Dr. Roda Shutters with stroke work-up revealing acute right basal ganglia stroke and small acute stroke of the left internal capsule and lentiform nucleus likely secondary to small vessel disease given uncontrolled risk factors of HTN and HLD and controlled DM.  However, given bilateral nature of her stroke, it was recommended to obtain cardiac event  monitor to rule out atrial fibrillation.  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  EF 65 to 70%.  Per note, patient apparently self discontinued all of her medications as she did not like her PCP which she is on the past.  She was started on DAPT for 21 days and then aspirin alone as well as Crestor for LDL 239 with prior history of statin myalgia.   Elevated SBP during admission with known HTN with long-term BP goal normotensive range.  Controlled DM with A1c 6.4 and restarted Metformin at discharge as this was previously thought discontinued.  No prior stroke history.  Residual deficits left facial droop, decreased left hand dexterity, slight weakness of left proximal lower extremity and cognitive impairment.  She was evaluated by therapy and transferred to rehab center at [facility] for functional decline on 9/13.    ROS:   14 system review of systems performed and negative with exception of those listed in HPI  PMH:  Past Medical History:  Diagnosis Date  . Diabetes mellitus without complication (HCC)   . High cholesterol   . History of stroke 2021  . Hypertension     PSH: History reviewed. No pertinent surgical history.  Social History:  Social History   Socioeconomic History  . Marital status: Single    Spouse name: Not on file  . Number of children: Not on file  . Years of education: Not on file  . Highest education level: Not on file  Occupational History  . Not on file  Tobacco Use  . Smoking status: Never Smoker  . Smokeless tobacco: Never Used  Vaping Use  . Vaping Use: Never used  Substance and Sexual Activity  . Alcohol use: No  . Drug use: No  . Sexual activity: Never  Other Topics Concern  . Not on file  Social History Narrative   Tobacco use, amount per day now: None.   Past tobacco use, amount per day: None.   How many years did you use tobacco: N/A   Alcohol use (drinks per week): None.   Diet:   Do you drink/eat things with caffeine: Coffee.   Marital status: Widowed                                 What year were you married? 1961   Do you live in a house, apartment, assisted living, condo, trailer, etc.? House.   Is it one or more stories? 2   How many persons live in your home? 2   Do you have pets in your home?( please list) Dogs ( 2 )   Highest Level of education completed? 11th  Grade.   Current or past profession: [employer].   Do you exercise? No.                                 Type and how often?   Do you have a living will? No   Do you have a DNR form?   No                                If not, do you want to discuss one?   Do you have signed POA/HPOA forms?  No  If so, please bring to you appointment      Do you have any difficulty bathing or dressing yourself? Yes   Do you have any difficulty preparing food or eating? Yes   Do you have any difficulty managing your medications? Yes   Do you have any difficulty managing your finances? Yes   Do you have any difficulty affording your medications? Yes   Social Determinants of Health   Financial Resource Strain: Not on file  Food Insecurity: No Food Insecurity  . Worried About Programme researcher, broadcasting/film/video in the Last Year: Never true  . Ran Out of Food in the Last Year: Never true  Transportation Needs: No Transportation Needs  . Lack of Transportation (Medical): No  . Lack of Transportation (Non-Medical): No  Physical Activity: Not on file  Stress: Not on file  Social Connections: Not on file  Intimate Partner Violence: Not on file    Family History:  Family History  Problem Relation Age of Onset  . Stroke Mother   . Lung cancer Father   . Cancer Daughter   . Hypertension Daughter   . High Cholesterol Daughter     Medications:   Current Outpatient Medications on File Prior to Visit  Medication Sig Dispense Refill  . acetaminophen (TYLENOL) 325 MG tablet Take by mouth.    Marland Kitchen atorvastatin (LIPITOR) 40 MG tablet Take 40 mg by mouth daily.    . ciprofloxacin (CIPRO) 500 MG tablet Take 1 tablet (500 mg total) by mouth 2 (two) times daily. Take along with Florastor 500 mg 2 times daily for 10 days. 14 tablet 0  . losartan (COZAAR) 100 MG tablet Take 1 tablet by mouth once daily 30 tablet 0  . metFORMIN (GLUCOPHAGE) 500 MG tablet Take 1 tablet by mouth once daily 90 tablet 0  . mineral  oil-hydrophilic petrolatum (AQUAPHOR) ointment Apply topically in the morning and at bedtime. To legs 420 g 0  . saccharomyces boulardii (FLORASTOR) 250 MG capsule Take 250 mg by mouth 2 (two) times daily.    . sertraline (ZOLOFT) 25 MG tablet TAKE 1 TABLET BY MOUTH AT BEDTIME 90 tablet 0   No current facility-administered medications on file prior to visit.    Allergies:   Allergies  Allergen Reactions  . Iohexol      Desc: Shaking and sore throat, Onset Date: [id number]   . Iohexol Other (See Comments)    Reaction not recalled      OBJECTIVE:  Physical Exam  Vitals:   06/26/20 1336  BP: (!) 167/73  Pulse: (!) 57  Weight: 143 lb (64.9 kg)  Height: 5' (1.524 m)   Body mass index is 27.93 kg/m. No exam data present  General: well developed, well nourished,  pleasant elderly Caucasian female, seated, in no evident distress Head: head normocephalic and atraumatic.   Neck: supple with no carotid or supraclavicular bruits Cardiovascular: regular rate and rhythm, no murmurs Musculoskeletal: no deformity Skin:  no rash/petichiae Vascular:  Normal pulses all extremities   Neurologic Exam Mental Status: Awake and fully alert.   Fluent speech and language.  Oriented to place and time. Recent and remote memory impaired. Attention span, concentration and fund of knowledge impaired. Mood and affect appropriate Cranial Nerves: Pupils equal, briskly reactive to light. Extraocular movements full without nystagmus. Visual fields full to confrontation. Hearing intact. Facial sensation intact. Face, tongue, palate moves normally and symmetrically.  Motor: Normal bulk and tone. Normal strength in all tested extremity muscles  Sensory.:  intact to touch , pinprick , position and vibratory sensation  Coordination: Rapid alternating movements normal in all extremities. Finger-to-nose and heel-to-shin performed accurately bilaterally. Gait and Station: Arises from chair without difficulty.  Stance is hunched. Gait demonstrates  normal stride length with mild imbalance and use of rolling walker Reflexes: 1+ and symmetric. Toes downgoing.        ASSESSMENT: Heather [name] is a 80 y.o. year old female presented with confusion, left-sided weakness, HTN and gait difficulty on 01/17/2020 with stroke work-up revealing right BG infarct and left internal capsule punctate infarcts likely secondary to small vessel disease with uncontrolled risk factors.  However, given bilateral nature of stroke, Dr. Roda Shutters recommended cardiac event monitor outpatient to rule out atrial fibrillation.  Vascular risk factors include HTN, HLD, DM, L VA occlusion chronic and medication noncompliance.      PLAN:  1. R BG and L IC strokes :  a. Residual deficit: Cognitive impairment and gait impairment.  Stable without worsening.  Discussed cognitive impairment post stroke and education to prevent worsening with further education provided on AVS. b. Continue aspirin 81 mg daily  and atorvastatin 40 mg daily for secondary stroke prevention.  c. Discussed secondary stroke prevention measures and importance of close PCP follow up for aggressive stroke risk factor management  2. HTN: BP goal <130/90.  Elevated today and recently elevated at home per patient.  Encouraged follow-up with PCP for further evaluation and discussion 3. HLD: LDL goal <70. Recent LDL 101.  Atorvastatin dosage recently decreased to 40 mg daily (prior 80mg ) d/t elevated LFTs -plan on repeating 3/11.  Advised patient to continue to follow with PCP for ongoing lipid panel monitoring and atorvastatin prescribing 4. DMII: A1c goal<7.0. Recent A1c 6.3 on Metformin per PCP    Follow up in 6 months or call earlier if needed   CC:  [facility] provider: Dr. 5/11, Artelia Laroche, NP    I spent 30 minutes of face-to-face and non-face-to-face time with patient and daughter.  This included previsit chart review, lab review, study review, order entry,  electronic health record documentation, patient education regarding prior stroke including etiology, residual deficits, importance of managing stroke risk factors and answered all other questions to patient and daughters satisfaction   Donalee Citrin, [facility]  [facility] [address] [place], [place]  Phone [phone] Fax [phone] Note: This document was prepared with digital dictation and possible smart phrase technology. Any transcriptional errors that result from this process are unintentional.

## 2020-06-26 NOTE — Progress Notes (Signed)
I agree with the above plan 

## 2020-06-26 NOTE — Patient Instructions (Signed)
Continue aspirin 81 mg daily  and atorvastatin 40 mg daily for secondary stroke prevention  Continue to follow up with PCP regarding cholesterol, blood pressure and diabetes management  Maintain strict control of hypertension with blood pressure goal below 130/90, diabetes with hemoglobin A1c goal below 7% and cholesterol with LDL cholesterol (bad cholesterol) goal below 70 mg/dL.       Followup in the future with me in 6 months or call earlier if needed       Thank you for coming to see [place] at [facility]. I hope we have been able to provide you high quality care today.  You may receive a patient satisfaction survey over the next few weeks. We would appreciate your feedback and comments so that we may continue to improve ourselves and the health of our patients.   Mild Neurocognitive Disorder Mild neurocognitive disorder, formerly known as mild cognitive impairment, is a disorder in which memory does not work as well as it should. This disorder may also cause problems with other mental functions, including thought, communication, behavior, and completion of tasks. These problems can be noticed and measured, but they usually do not interfere with daily activities or the ability to live independently. Mild neurocognitive disorder typically develops after 79 years of age, but it can also develop at younger ages. It is not as serious as major neurocognitive disorder, also known as dementia, but it may be the first sign of it. Generally, symptoms of this condition get worse over time. In rare cases, symptoms can get better. What are the causes? This condition may be caused by:  Brain disorders like Alzheimer's disease, Parkinson's disease, and other conditions that gradually damage nerve cells (neurodegenerative conditions).  Diseases that affect blood vessels in the brain and result in small strokes.  Certain infections, such as HIV.  Traumatic brain injury.  Other  medical conditions, such as brain tumors, underactive thyroid (hypothyroidism), and vitamin B12 deficiency.  Use of certain drugs or prescription medicines. What increases the risk? The following factors may make you more likely to develop this condition:  Being older than 65 years.  Being female.  Low education level.  Diabetes, high blood pressure, high cholesterol, and other conditions that increase the risk for blood vessel diseases.  Untreated or undertreated sleep apnea.  Having a certain type of gene that can be passed from parent to child (inherited).  Chronic health problems such as heart disease, lung disease, liver disease, kidney disease, or depression. What are the signs or symptoms? Symptoms of this condition include:  Difficulty remembering. You may: ? Forget names, phone numbers, or details of recent events. ? Forget social events and appointments. ? Repeatedly forget where you put your car keys or other items.  Difficulty thinking and solving problems. You may have trouble with complex tasks, such as: ? Paying bills. ? Driving in unfamiliar places.  Difficulty communicating. You may have trouble: ? Finding the right word or naming an object. ? Forming a sentence that makes sense, or understanding what you read or hear.  Changes in your behavior or personality. When this happens, you may: ? Lose interest in the things that you used to enjoy. ? Withdraw from social situations. ? Get angry more easily than usual. ? Act before thinking. How is this diagnosed? This condition is diagnosed based on:  Your symptoms. Your health care provider may ask you and the people you spend time with, such as family and friends, about your symptoms.  Evaluation of mental functions (neuropsychological testing). Your health care provider may refer you to a neurologist or mental health specialist to evaluate your mental functions in detail. To identify the cause of your  condition, your health care provider may:  Get a detailed medical history.  Ask about use of alcohol, drugs, and prescription medicines.  Do a physical exam.  Order blood tests and brain imaging exams. How is this treated? Mild neurocognitive disorder that is caused by medicine use, drug use, infection, or another medical condition may improve when the cause is treated, or when medicines or drugs are stopped. If this disorder has another cause, it generally does not improve and may get worse. In these cases, the goal of treatment is to help you manage the loss of mental function. Treatments in these cases include:  Medicine. Medicine mainly helps memory and behavior symptoms.  Talk therapy. Talk therapy provides education, emotional support, memory aids, and other ways of making up for problems with mental function.  Lifestyle changes, including: ? Getting regular exercise. ? Eating a healthy diet that includes omega-3 fatty acids. ? Challenging your thinking and memory skills. ? Having more social interaction. Follow these instructions at home: Eating and drinking  Drink enough fluid to keep your urine pale yellow.  Eat a healthy diet that includes omega-3 fatty acids. These can be found in: ? Fish. ? Nuts. ? Leafy vegetables. ? Vegetable oils.  If you drink alcohol: ? Limit how much you use to:  0-1 drink a day for women.  0-2 drinks a day for men. ? Be aware of how much alcohol is in your drink. In the [place] one drink equals one 12 oz bottle of beer (355 mL), one 5 oz glass of wine (148 mL), or one 1 oz glass of hard liquor (44 mL).   Lifestyle  Get regular exercise as told by your health care provider.  Do not use any products that contain nicotine or tobacco, such as cigarettes, e-cigarettes, and chewing tobacco. If you need help quitting, ask your health care provider.  Practice ways to manage stress. If you need help managing stress, ask your health care  provider.  Continue to have social interaction.  Keep your mind active with stimulating activities you enjoy, such as reading or playing games.  Make sure to get quality sleep. Follow these tips: ? Avoid napping during the day. ? Keep your sleeping area dark and cool. ? Avoid exercising during the few hours before you go to bed. ? Avoid caffeine products in the evening.   General instructions  Take over-the-counter and prescription medicines only as told by your health care provider. Your health care provider may recommend that you avoid taking medicines that can affect thinking, such as pain medicines or sleep medicines.  Work with your health care provider to find out what you need help with and what your safety needs are.  Keep all follow-up visits. This is important. Where to find more information  [employer] on Aging: https://walker.com/ Contact a health care provider if:  You have any new symptoms. Get help right away if:  You develop new confusion or your confusion gets worse.  You act in ways that place you or your family in danger. Summary  Mild neurocognitive disorder is a disorder in which memory does not work as well as it should.  Mild neurocognitive disorder can have many causes. It may be the first stage of dementia.  To manage your condition, get regular  exercise, keep your mind active, get quality sleep, and eat a healthy diet. This information is not intended to replace advice given to you by your health care provider. Make sure you discuss any questions you have with your health care provider. Document Revised: 09/11/2019 Document Reviewed: 09/11/2019 Elsevier Patient Education  2021 [employer].

## 2020-07-19 ENCOUNTER — Other Ambulatory Visit: Payer: Self-pay

## 2020-07-24 ENCOUNTER — Other Ambulatory Visit: Payer: Medicare Other

## 2020-07-24 ENCOUNTER — Other Ambulatory Visit: Payer: Self-pay

## 2020-07-24 DIAGNOSIS — R748 Abnormal levels of other serum enzymes: Secondary | ICD-10-CM

## 2020-07-24 DIAGNOSIS — I10 Essential (primary) hypertension: Secondary | ICD-10-CM

## 2020-07-24 LAB — HEPATIC FUNCTION PANEL
AG Ratio: 1.4 (calc) (ref 1.0–2.5)
ALT: 38 U/L — ABNORMAL HIGH (ref 6–29)
AST: 33 U/L (ref 10–35)
Albumin: 3.8 g/dL (ref 3.6–5.1)
Alkaline phosphatase (APISO): 203 U/L — ABNORMAL HIGH (ref 37–153)
Bilirubin, Direct: 0.1 mg/dL (ref 0.0–0.2)
Globulin: 2.7 g/dL (calc) (ref 1.9–3.7)
Indirect Bilirubin: 0.3 mg/dL (calc) (ref 0.2–1.2)
Total Bilirubin: 0.4 mg/dL (ref 0.2–1.2)
Total Protein: 6.5 g/dL (ref 6.1–8.1)

## 2020-07-24 MED ORDER — LOSARTAN POTASSIUM 100 MG PO TABS: 100.0000 mg | ORAL_TABLET | Freq: Every day | ORAL | 1 refills | Status: AC

## 2020-07-28 ENCOUNTER — Other Ambulatory Visit: Payer: Self-pay | Admitting: Family

## 2020-07-28 DIAGNOSIS — F321 Major depressive disorder, single episode, moderate: Secondary | ICD-10-CM

## 2020-08-04 ENCOUNTER — Other Ambulatory Visit: Payer: Self-pay | Admitting: Family

## 2020-08-04 DIAGNOSIS — E119 Type 2 diabetes mellitus without complications: Secondary | ICD-10-CM

## 2020-08-07 ENCOUNTER — Telehealth: Payer: Self-pay | Admitting: Family

## 2020-08-07 NOTE — Telephone Encounter (Signed)
pts daughter Orion Modest called stating that Metformin has been called in 4 days ago(pt now has missed 3 doses), when they get to pharmacy, they are told nothing is ready & no response from [facility]  Please advise Thanks, Rosezella [place]

## 2020-08-07 NOTE — Telephone Encounter (Signed)
Metformin was sent to pharmacy with confirmation on 08/05/2020. Daughter was notified and stated that she is going to call the pharmacy to see if they received it.

## 2020-08-21 ENCOUNTER — Other Ambulatory Visit: Payer: Self-pay

## 2020-08-21 ENCOUNTER — Ambulatory Visit (INDEPENDENT_AMBULATORY_CARE_PROVIDER_SITE_OTHER): Payer: Medicare Other | Admitting: Family

## 2020-08-21 ENCOUNTER — Encounter: Payer: Self-pay | Admitting: Family

## 2020-08-21 VITALS — BP 120/80 | HR 65 | Temp 97.5°F | Resp 16 | Ht 60.0 in | Wt 139.0 lb

## 2020-08-21 DIAGNOSIS — I639 Cerebral infarction, unspecified: Secondary | ICD-10-CM

## 2020-08-21 DIAGNOSIS — R399 Unspecified symptoms and signs involving the genitourinary system: Secondary | ICD-10-CM | POA: Diagnosis not present

## 2020-08-21 LAB — POCT URINALYSIS DIPSTICK
Bilirubin, UA: NEGATIVE
Glucose, UA: NEGATIVE
Nitrite, UA: POSITIVE
Protein, UA: POSITIVE — AB
Spec Grav, UA: <=1.005 — AB (ref 1.010–1.025)
Urobilinogen, UA: NEGATIVE E.U./dL — AB
pH, UA: 6.5 (ref 5.0–8.0)

## 2020-08-21 MED ORDER — SACCHAROMYCES BOULARDII 250 MG PO CAPS: 250.0000 mg | ORAL_CAPSULE | Freq: Two times a day (BID) | ORAL | 0 refills | Status: DC

## 2020-08-21 MED ORDER — CIPROFLOXACIN HCL 500 MG PO TABS: 500.0000 mg | ORAL_TABLET | Freq: Two times a day (BID) | ORAL | 0 refills | Status: DC

## 2020-08-21 NOTE — Progress Notes (Signed)
Provider: Richarda Blade FNP-C  Chavez Rosol, Donalee Citrin, NP  Patient Care Team: Catrena Vari, Donalee Citrin, NP as PCP - General (Family Medicine) System, Provider Not In [place] Registered Nurse [facility] and Palliative Medicine)  Extended Emergency Contact Information Primary Emergency Contact: Gordan Payment [place] of [place] Mobile Phone: [phone] Relation: Daughter Secondary Emergency Contact: Mertie Moores Mobile Phone: [phone] Relation: Daughter  Code Status:  DNR Goals of care: Advanced Directive information Advanced Directives 08/21/2020  Does Patient Have a Medical Advance Directive? Yes  Type of Advance Directive Out of facility DNR (pink MOST or yellow form)  Does patient want to make changes to medical advance directive? No - Patient declined  Would patient like information on creating a medical advance directive? -  Pre-existing out of facility DNR order (yellow form or pink MOST form) -     Chief Complaint  Patient presents with  . Acute Visit    Possible UTI    HPI:  Pt is a 80 y.o. female seen today for an acute visit for evaluation of possible urinary tract infection.she is here with her daughter who providers additional information.states patient has been hallucinating.They were in a Restaurant eating asked the daughter where the dog went yet there was no dog.Has had strong urine odor.Usually does not have any symptoms whenever she has urinary infection.   Past Medical History:  Diagnosis Date  . Diabetes mellitus without complication (HCC)   . High cholesterol   . History of stroke 2021  . Hypertension    History reviewed. No pertinent surgical history.  Allergies  Allergen Reactions  . Iohexol      Desc: Shaking and sore throat, Onset Date: [id number]   . Iohexol Other (See Comments)    Reaction not recalled    Outpatient Encounter Medications as of 08/21/2020  Medication Sig  . acetaminophen (TYLENOL) 325 MG tablet Take by  mouth.  Marland Kitchen atorvastatin (LIPITOR) 40 MG tablet Take 40 mg by mouth daily.  . ciprofloxacin (CIPRO) 500 MG tablet Take 1 tablet (500 mg total) by mouth 2 (two) times daily. Take along with Florastor 500 mg 2 times daily for 10 days.  Marland Kitchen losartan (COZAAR) 100 MG tablet Take 1 tablet (100 mg total) by mouth daily.  . metFORMIN (GLUCOPHAGE) 500 MG tablet Take 1 tablet by mouth once daily  . mineral oil-hydrophilic petrolatum (AQUAPHOR) ointment Apply topically in the morning and at bedtime. To legs  . sertraline (ZOLOFT) 25 MG tablet TAKE 1 TABLET BY MOUTH AT BEDTIME   No facility-administered encounter medications on file as of 08/21/2020.    Review of Systems  Constitutional: Negative for appetite change, chills, fatigue and fever.  Respiratory: Negative for cough, chest tightness, shortness of breath and wheezing.   Cardiovascular: Negative for chest pain, palpitations and leg swelling.  Gastrointestinal: Negative for abdominal distention, abdominal pain, constipation, diarrhea, nausea and vomiting.  Genitourinary: Negative for difficulty urinating, dysuria, flank pain, frequency, hematuria and urgency.  Musculoskeletal: Positive for gait problem. Negative for myalgias.  Psychiatric/Behavioral: Positive for hallucinations. Negative for agitation, behavioral problems and sleep disturbance. The patient is not nervous/anxious.     Immunization History  Administered Date(s) Administered  . Influenza, High Dose Seasonal PF 02/05/2020  . Influenza-Unspecified 05/12/2019  . PFIZER(Purple Top)SARS-COV-2 Vaccination 07/25/2019, 08/17/2019  . Pneumococcal Polysaccharide-23 05/01/2020   Pertinent  Health Maintenance Due  Topic Date Due  . FOOT EXAM  Never done  . OPHTHALMOLOGY EXAM  Never done  . DEXA SCAN  Never done  . INFLUENZA VACCINE  12/09/2020  . HEMOGLOBIN A1C  12/16/2020  . PNA vac Low Risk Adult (2 of 2 - PCV13) 05/01/2021   Fall Risk  08/21/2020 06/18/2020 05/23/2020 04/02/2020 02/13/2020   Falls in the past year? 0 1 0 0 1  Number falls in past yr: 0 1 0 0 0  Injury with Fall? 0 0 0 0 0   Functional Status Survey:    Vitals:   08/21/20 1408  BP: 120/80  Pulse: 65  Resp: 16  Temp: (!) 97.5 F (36.4 C)  SpO2: 98%  Weight: 139 lb (63 kg)  Height: 5' (1.524 m)   Body mass index is 27.15 kg/m. Physical Exam Vitals reviewed.  Constitutional:      General: She is not in acute distress.    Appearance: She is overweight. She is not ill-appearing.  HENT:     Head: Normocephalic.  Cardiovascular:     Rate and Rhythm: Normal rate and regular rhythm.     Pulses: Normal pulses.     Heart sounds: Normal heart sounds. No murmur heard. No friction rub. No gallop.   Pulmonary:     Effort: Pulmonary effort is normal. No respiratory distress.     Breath sounds: Normal breath sounds. No wheezing, rhonchi or rales.  Chest:     Chest wall: No tenderness.  Abdominal:     General: Bowel sounds are normal. There is no distension.     Palpations: Abdomen is soft. There is no mass.     Tenderness: There is no abdominal tenderness. There is no right CVA tenderness, left CVA tenderness, guarding or rebound.  Neurological:     Mental Status: She is alert. Mental status is at baseline.     Motor: No weakness.     Gait: Gait abnormal.  Psychiatric:        Mood and Affect: Mood normal.        Speech: Speech normal.        Behavior: Behavior normal.        Thought Content: Thought content normal.     Labs reviewed: Recent Labs    01/18/20 0228 01/19/20 0107 01/20/20 0120 01/21/20 0237 01/22/20 0112 02/13/20 1525 06/18/20 0000  NA 143 139 139   < > 139 140 140  K 3.0* 3.6 3.4*   < > 3.5 3.6 3.9  CL 108 106 105   < > 104 103 102  CO2 23 22 23    < > 28 26 31   GLUCOSE 114* 102* 140*   < > 117* 108* 151*  BUN 17 18 16    < > 22 16 22   CREATININE 1.31* 1.23* 1.22*   < > 1.25* 1.31* 1.23*  CALCIUM 8.8* 9.0 9.2   < > 9.2 9.5 9.9  MG 1.9 1.7 1.8  --   --   --   --   PHOS  3.7 3.1 3.5  --   --   --   --    < > = values in this interval not displayed.   Recent Labs    01/20/20 0120 01/21/20 0237 01/22/20 0112 02/13/20 1525 06/18/20 0000 07/24/20 1403  AST 26 24 28 19  76* 33  ALT 17 13 13 15  99* 38*  ALKPHOS 88 82 93  --   --   --   BILITOT 0.6 0.6 0.4 0.4 0.4 0.4  PROT 6.6 6.2* 6.1* 6.7 6.8 6.5  ALBUMIN 3.3* 3.0* 3.0*  --   --   --  Recent Labs    01/20/20 0120 02/13/20 1525 06/18/20 0000  WBC 6.7 6.9 7.2  NEUTROABS 3.9 4,140 4,306  HGB 15.5* 15.0 13.8  HCT 46.1* 44.2 41.2  MCV 91.7 90.0 93.2  PLT 251 339 296   Lab Results  Component Value Date   TSH 1.79 06/18/2020   Lab Results  Component Value Date   HGBA1C 6.3 (H) 06/18/2020   Lab Results  Component Value Date   CHOL 176 06/18/2020   HDL 47 (L) 06/18/2020   LDLCALC 101 (H) 06/18/2020   TRIG 181 (H) 06/18/2020   CHOLHDL 3.7 06/18/2020    Significant Diagnostic Results in last 30 days:  No results found.  Assessment/Plan  Symptoms of urinary tract infection Afebrile. Negative exam findings.  - POC Urinalysis Dipstick indicates large blood,positive for protein,Nitrites and large Leukocytes. Will start on Cipro as below.has taken Cipro in the past without any complication.Advised to take with Probiotics.  - Encouraged to increase water intake to 6 - 8 glasses daily.  - Urine Culture - ciprofloxacin (CIPRO) 500 MG tablet; Take 1 tablet (500 mg total) by mouth 2 (two) times daily. Take along with Florastor 500 mg 2 times daily for 10 days.  Dispense: 14 tablet; Refill: 0 - saccharomyces boulardii (FLORASTOR) 250 MG capsule; Take 1 capsule (250 mg total) by mouth 2 (two) times daily.  Dispense: 20 capsule; Refill: 0  Family/ staff Communication: Reviewed plan of care with patient and daughter verbalized understanding.   Labs/tests ordered:  - POC Urinalysis Dipstick - Urine Culture  Next Appointment: As needed if symptoms worsen or fail to improve    Caesar Bookman,  NP

## 2020-08-21 NOTE — Patient Instructions (Signed)
Urinary Tract Infection, Adult A urinary tract infection (UTI) is an infection of any part of the urinary tract. The urinary tract includes:  The kidneys.  The ureters.  The bladder.  The urethra. These organs make, store, and get rid of pee (urine) in the body. What are the causes? This infection is caused by germs (bacteria) in your genital area. These germs grow and cause swelling (inflammation) of your urinary tract. What increases the risk? The following factors may make you more likely to develop this condition:  Using a small, thin tube (catheter) to drain pee.  Not being able to control when you pee or poop (incontinence).  Being female. If you are female, these things can increase the risk: ? Using these methods to prevent pregnancy:  A medicine that kills sperm (spermicide).  A device that blocks sperm (diaphragm). ? Having low levels of a female hormone (estrogen). ? Being pregnant. You are more likely to develop this condition if:  You have genes that add to your risk.  You are sexually active.  You take antibiotic medicines.  You have trouble peeing because of: ? A prostate that is bigger than normal, if you are female. ? A blockage in the part of your body that drains pee from the bladder. ? A kidney stone. ? A nerve condition that affects your bladder. ? Not getting enough to drink. ? Not peeing often enough.  You have other conditions, such as: ? Diabetes. ? A weak disease-fighting system (immune system). ? Sickle cell disease. ? Gout. ? Injury of the spine. What are the signs or symptoms? Symptoms of this condition include:  Needing to pee right away.  Peeing small amounts often.  Pain or burning when peeing.  Blood in the pee.  Pee that smells bad or not like normal.  Trouble peeing.  Pee that is cloudy.  Fluid coming from the vagina, if you are female.  Pain in the belly or lower back. Other symptoms include:  Vomiting.  Not  feeling hungry.  Feeling mixed up (confused). This may be the first symptom in older adults.  Being tired and grouchy (irritable).  A fever.  Watery poop (diarrhea). How is this treated?  Taking antibiotic medicine.  Taking other medicines.  Drinking enough water. In some cases, you may need to see a specialist. Follow these instructions at home: Medicines  Take over-the-counter and prescription medicines only as told by your doctor.  If you were prescribed an antibiotic medicine, take it as told by your doctor. Do not stop taking it even if you start to feel better. General instructions  Make sure you: ? Pee until your bladder is empty. ? Do not hold pee for a long time. ? Empty your bladder after sex. ? Wipe from front to back after peeing or pooping if you are a female. Use each tissue one time when you wipe.  Drink enough fluid to keep your pee pale yellow.  Keep all follow-up visits.   Contact a doctor if:  You do not get better after 1-2 days.  Your symptoms go away and then come back. Get help right away if:  You have very bad back pain.  You have very bad pain in your lower belly.  You have a fever.  You have chills.  You feeling like you will vomit or you vomit. Summary  A urinary tract infection (UTI) is an infection of any part of the urinary tract.  This condition is caused by   germs in your genital area.  There are many risk factors for a UTI.  Treatment includes antibiotic medicines.  Drink enough fluid to keep your pee pale yellow. This information is not intended to replace advice given to you by your health care provider. Make sure you discuss any questions you have with your health care provider. Document Revised: 12/08/2019 Document Reviewed: 12/08/2019 Elsevier Patient Education  2021 Elsevier Inc.  

## 2020-08-23 LAB — URINE CULTURE
MICRO NUMBER:: 11766464
SPECIMEN QUALITY:: ADEQUATE

## 2020-08-26 ENCOUNTER — Other Ambulatory Visit: Payer: Self-pay

## 2020-08-28 ENCOUNTER — Telehealth: Payer: Self-pay

## 2020-08-28 ENCOUNTER — Ambulatory Visit: Payer: Medicare Other | Admitting: Family

## 2020-08-28 MED ORDER — NITROFURANTOIN MONOHYD MACRO 100 MG PO CAPS: 100.0000 mg | ORAL_CAPSULE | Freq: Two times a day (BID) | ORAL | 0 refills | Status: AC

## 2020-08-28 NOTE — Telephone Encounter (Signed)
-----   Message from Caesar Bookman, NP sent at 08/26/2020  9:44 AM EDT ----- - final urine culture indicates > 100,000 colonies of E.Coli not sensitive to prescribed Cipro.Please discontinue Cipro then start on Nitrofurantoin 100 mg capsule one by mouth twice daily x 7 days.

## 2020-08-28 NOTE — Telephone Encounter (Signed)
Discussed results with patients daughter Vernice Jefferson verbalized understanding of results.  Medication list updated, new rx sent to pharmacy on file.

## 2020-09-19 ENCOUNTER — Other Ambulatory Visit: Payer: Self-pay | Admitting: Family

## 2020-09-19 DIAGNOSIS — E119 Type 2 diabetes mellitus without complications: Secondary | ICD-10-CM

## 2020-10-11 ENCOUNTER — Other Ambulatory Visit: Payer: Self-pay

## 2020-10-11 ENCOUNTER — Encounter: Payer: Self-pay | Admitting: Adult Health

## 2020-10-11 ENCOUNTER — Ambulatory Visit (INDEPENDENT_AMBULATORY_CARE_PROVIDER_SITE_OTHER): Payer: Medicare HMO | Admitting: Adult Health

## 2020-10-11 VITALS — BP 134/74 | HR 61 | Temp 97.5°F | Resp 16 | Ht 60.0 in | Wt 138.4 lb

## 2020-10-11 DIAGNOSIS — F22 Delusional disorders: Secondary | ICD-10-CM

## 2020-10-11 DIAGNOSIS — R350 Frequency of micturition: Secondary | ICD-10-CM

## 2020-10-11 DIAGNOSIS — R410 Disorientation, unspecified: Secondary | ICD-10-CM

## 2020-10-11 DIAGNOSIS — R8290 Unspecified abnormal findings in urine: Secondary | ICD-10-CM

## 2020-10-11 DIAGNOSIS — N3000 Acute cystitis without hematuria: Secondary | ICD-10-CM

## 2020-10-11 DIAGNOSIS — I10 Essential (primary) hypertension: Secondary | ICD-10-CM

## 2020-10-11 DIAGNOSIS — F5101 Primary insomnia: Secondary | ICD-10-CM

## 2020-10-11 DIAGNOSIS — R399 Unspecified symptoms and signs involving the genitourinary system: Secondary | ICD-10-CM

## 2020-10-11 LAB — POCT URINALYSIS DIPSTICK
Glucose, UA: NEGATIVE
Nitrite, UA: NEGATIVE
Protein, UA: POSITIVE — AB
Spec Grav, UA: 1.015 (ref 1.010–1.025)
Urobilinogen, UA: NEGATIVE E.U./dL — AB
pH, UA: 5 (ref 5.0–8.0)

## 2020-10-11 MED ORDER — SACCHAROMYCES BOULARDII 250 MG PO CAPS: 250.0000 mg | ORAL_CAPSULE | Freq: Two times a day (BID) | ORAL | 0 refills | Status: AC

## 2020-10-11 MED ORDER — MELATONIN 10 MG PO TABS
10.0000 mg | ORAL_TABLET | Freq: Every evening | ORAL | 0 refills | Status: DC | PRN
Start: 2020-10-11 — End: 2020-11-27

## 2020-10-11 MED ORDER — CIPROFLOXACIN HCL 500 MG PO TABS: 500.0000 mg | ORAL_TABLET | Freq: Two times a day (BID) | ORAL | 0 refills | Status: DC

## 2020-10-11 NOTE — Progress Notes (Signed)
[facility] clinic  Provider:   Kenard Gower  DNP   Code Status:  DNR   Goals of Care:  Advanced Directives 10/11/2020  Does Patient Have a Medical Advance Directive? Yes  Type of Advance Directive Out of facility DNR (pink MOST or yellow form)  Does patient want to make changes to medical advance directive? No - Patient declined  Would patient like information on creating a medical advance directive? -  Pre-existing out of facility DNR order (yellow form or pink MOST form) -     Chief Complaint  Patient presents with  . Acute Visit    Complains of possible UTI    HPI: Patient is a 80 y.o. female seen today for an acute visit for complaints of possible UTI. Daughter was with patient and was giving the history. Patient was found by daughter on the floor with urine saturated diaper. Daughter noticed that her mother gets confused. "She sees things such as dogs and fences." She has noticed her hands shakes during dinner but not constant. She has no episode of dropping anything she is holding. Patient reported having frequent urination but denies dysuria nor fever. Patient is currently on a waiting list for an assisted living facility placement.   Past Medical History:  Diagnosis Date  . Diabetes mellitus without complication (HCC)   . High cholesterol   . History of stroke 2021  . Hypertension     History reviewed. No pertinent surgical history.  Allergies  Allergen Reactions  . Iohexol      Desc: Shaking and sore throat, Onset Date: [id number]   . Iohexol Other (See Comments)    Reaction not recalled    Outpatient Encounter Medications as of 10/11/2020  Medication Sig  . acetaminophen (TYLENOL) 325 MG tablet Take by mouth.  Marland Kitchen atorvastatin (LIPITOR) 40 MG tablet Take 40 mg by mouth daily.  Marland Kitchen losartan (COZAAR) 100 MG tablet Take 1 tablet (100 mg total) by mouth daily.  . metFORMIN (GLUCOPHAGE) 500 MG tablet Take 1 tablet by mouth once daily  . mineral oil-hydrophilic  petrolatum (AQUAPHOR) ointment Apply topically in the morning and at bedtime. To legs  . saccharomyces boulardii (FLORASTOR) 250 MG capsule Take 1 capsule (250 mg total) by mouth 2 (two) times daily.  . sertraline (ZOLOFT) 25 MG tablet TAKE 1 TABLET BY MOUTH AT BEDTIME   No facility-administered encounter medications on file as of 10/11/2020.    Review of Systems:  Review of Systems  Constitutional: Positive for appetite change. Negative for chills and fever.  HENT: Negative for congestion and mouth sores.   Eyes: Negative for discharge and itching.  Respiratory: Negative for shortness of breath and wheezing.   Cardiovascular: Negative for leg swelling.  Gastrointestinal: Negative for abdominal pain, constipation, nausea and vomiting.  Genitourinary: Positive for frequency. Negative for difficulty urinating, dysuria and hematuria.  Skin: Negative for rash.  Neurological: Negative for dizziness and tremors.  Psychiatric/Behavioral: Positive for sleep disturbance. Negative for agitation and behavioral problems.    Health Maintenance  Topic Date Due  . Pneumococcal Vaccine 37-92 Years old (1 of 2 - PPSV23) Never done  . FOOT EXAM  Never done  . OPHTHALMOLOGY EXAM  Never done  . TETANUS/TDAP  Never done  . Zoster Vaccines- Shingrix (1 of 2) Never done  . DEXA SCAN  Never done  . COVID-19 Vaccine (3 - Booster for Pfizer series) 01/17/2020  . INFLUENZA VACCINE  12/09/2020  . HEMOGLOBIN A1C  12/16/2020  . PNA vac  Low Risk Adult (2 of 2 - PCV13) 05/01/2021  . Hepatitis C Screening  Completed  . HPV VACCINES  Aged Out    Physical Exam: Vitals:   10/11/20 1528  BP: 134/74  Pulse: 61  Resp: 16  Temp: (!) 97.5 F (36.4 C)  SpO2: 97%  Height: 5' (1.524 m)   Body mass index is 27.15 kg/m. Physical Exam Constitutional:      Appearance: Normal appearance. She is obese.  HENT:     Head: Normocephalic and atraumatic.     Nose: Nose normal.     Mouth/Throat:     Mouth: Mucous  membranes are moist.  Eyes:     Conjunctiva/sclera: Conjunctivae normal.  Cardiovascular:     Rate and Rhythm: Normal rate and regular rhythm.     Pulses: Normal pulses.     Heart sounds: Normal heart sounds.  Pulmonary:     Effort: Pulmonary effort is normal.     Breath sounds: Normal breath sounds.  Abdominal:     General: Bowel sounds are normal.     Palpations: Abdomen is soft.  Musculoskeletal:        General: Normal range of motion.     Cervical back: Normal range of motion and neck supple.  Skin:    General: Skin is warm and dry.  Neurological:     Mental Status: She is alert. Mental status is at baseline.  Psychiatric:        Mood and Affect: Mood normal.        Behavior: Behavior normal.     Labs reviewed: Basic Metabolic Panel: Recent Labs    01/18/20 0228 01/19/20 0107 01/20/20 0120 01/21/20 0237 01/22/20 0112 02/13/20 1525 06/18/20 0000  NA 143 139 139   < > 139 140 140  K 3.0* 3.6 3.4*   < > 3.5 3.6 3.9  CL 108 106 105   < > 104 103 102  CO2 23 22 23    < > 28 26 31   GLUCOSE 114* 102* 140*   < > 117* 108* 151*  BUN 17 18 16    < > 22 16 22   CREATININE 1.31* 1.23* 1.22*   < > 1.25* 1.31* 1.23*  CALCIUM 8.8* 9.0 9.2   < > 9.2 9.5 9.9  MG 1.9 1.7 1.8  --   --   --   --   PHOS 3.7 3.1 3.5  --   --   --   --   TSH  --  5.309*  --   --   --  2.39 1.79   < > = values in this interval not displayed.   Liver Function Tests: Recent Labs    01/20/20 0120 01/21/20 0237 01/22/20 0112 02/13/20 1525 06/18/20 0000 07/24/20 1403  AST 26 24 28 19  76* 33  ALT 17 13 13 15  99* 38*  ALKPHOS 88 82 93  --   --   --   BILITOT 0.6 0.6 0.4 0.4 0.4 0.4  PROT 6.6 6.2* 6.1* 6.7 6.8 6.5  ALBUMIN 3.3* 3.0* 3.0*  --   --   --    No results for input(s): LIPASE, AMYLASE in the last 8760 hours. Recent Labs    01/19/20 0107  AMMONIA 22   CBC: Recent Labs    01/20/20 0120 02/13/20 1525 06/18/20 0000  WBC 6.7 6.9 7.2  NEUTROABS 3.9 4,140 4,306  HGB 15.5* 15.0 13.8   HCT 46.1* 44.2 41.2  MCV 91.7 90.0 93.2  PLT  251 339 296   Lipid Panel: Recent Labs    01/18/20 0228 02/13/20 1525 06/18/20 0000  CHOL 298* 191 176  HDL 37* 48* 47*  LDLCALC 239* 125* 101*  TRIG 112 85 181*  CHOLHDL 8.1 4.0 3.7   Lab Results  Component Value Date   HGBA1C 6.3 (H) 06/18/2020    Procedures since last visit: No results found.  Assessment/Plan 1. Urinary frequency Bad odor of urine Confusion  Delusion - POC Urinalysis Dipstick - Urine Culture  -   Urine dipstick showed cloudy urine, moderate ketones, large blood, positive protein and large leukocyte. -  Will start on Ciprofloxacin 500 mg BID X 5 days and Florastor 250 mg BID X 8 days   2.  Insomnia -  Will start Melatonin 10 mg 1 tab PO Q HS PRN    Labs/tests ordered:  Urine dipstick and urine culture and sensitivity   Next appt:  12/19/2020

## 2020-10-11 NOTE — Patient Instructions (Signed)
Urinary Tract Infection, Adult  A urinary tract infection (UTI) is an infection of any part of the urinary tract. The urinary tract includes the kidneys, ureters, bladder, and urethra. These organs make, store, and get rid of urine in the body. An upper UTI affects the ureters and kidneys. A lower UTI affects the bladder and urethra. What are the causes? Most urinary tract infections are caused by bacteria in your genital area around your urethra, where urine leaves your body. These bacteria grow and cause inflammation of your urinary tract. What increases the risk? You are more likely to develop this condition if:  You have a urinary catheter that stays in place.  You are not able to control when you urinate or have a bowel movement (incontinence).  You are female and you: ? Use a spermicide or diaphragm for birth control. ? Have low estrogen levels. ? Are pregnant.  You have certain genes that increase your risk.  You are sexually active.  You take antibiotic medicines.  You have a condition that causes your flow of urine to slow down, such as: ? An enlarged prostate, if you are female. ? Blockage in your urethra. ? A kidney stone. ? A nerve condition that affects your bladder control (neurogenic bladder). ? Not getting enough to drink, or not urinating often.  You have certain medical conditions, such as: ? Diabetes. ? A weak disease-fighting system (immunesystem). ? Sickle cell disease. ? Gout. ? Spinal cord injury. What are the signs or symptoms? Symptoms of this condition include:  Needing to urinate right away (urgency).  Frequent urination. This may include small amounts of urine each time you urinate.  Pain or burning with urination.  Blood in the urine.  Urine that smells bad or unusual.  Trouble urinating.  Cloudy urine.  Vaginal discharge, if you are female.  Pain in the abdomen or the lower back. You may also have:  Vomiting or a decreased  appetite.  Confusion.  Irritability or tiredness.  A fever or chills.  Diarrhea. The first symptom in older adults may be confusion. In some cases, they may not have any symptoms until the infection has worsened. How is this diagnosed? This condition is diagnosed based on your medical history and a physical exam. You may also have other tests, including:  Urine tests.  Blood tests.  Tests for STIs (sexually transmitted infections). If you have had more than one UTI, a cystoscopy or imaging studies may be done to determine the cause of the infections. How is this treated? Treatment for this condition includes:  Antibiotic medicine.  Over-the-counter medicines to treat discomfort.  Drinking enough water to stay hydrated. If you have frequent infections or have other conditions such as a kidney stone, you may need to see a health care provider who specializes in the urinary tract (urologist). In rare cases, urinary tract infections can cause sepsis. Sepsis is a life-threatening condition that occurs when the body responds to an infection. Sepsis is treated in the hospital with IV antibiotics, fluids, and other medicines. Follow these instructions at home: Medicines  Take over-the-counter and prescription medicines only as told by your health care provider.  If you were prescribed an antibiotic medicine, take it as told by your health care provider. Do not stop using the antibiotic even if you start to feel better. General instructions  Make sure you: ? Empty your bladder often and completely. Do not hold urine for long periods of time. ? Empty your bladder after   sex. ? Wipe from front to back after urinating or having a bowel movement if you are female. Use each tissue only one time when you wipe.  Drink enough fluid to keep your urine pale yellow.  Keep all follow-up visits. This is important.   Contact a health care provider if:  Your symptoms do not get better after 1-2  days.  Your symptoms go away and then return. Get help right away if:  You have severe pain in your back or your lower abdomen.  You have a fever or chills.  You have nausea or vomiting. Summary  A urinary tract infection (UTI) is an infection of any part of the urinary tract, which includes the kidneys, ureters, bladder, and urethra.  Most urinary tract infections are caused by bacteria in your genital area.  Treatment for this condition often includes antibiotic medicines.  If you were prescribed an antibiotic medicine, take it as told by your health care provider. Do not stop using the antibiotic even if you start to feel better.  Keep all follow-up visits. This is important. This information is not intended to replace advice given to you by your health care provider. Make sure you discuss any questions you have with your health care provider. Document Revised: 12/08/2019 Document Reviewed: 12/08/2019 Elsevier Patient Education  2021 [employer]vier Inc.  

## 2020-10-13 ENCOUNTER — Other Ambulatory Visit: Payer: Self-pay | Admitting: Adult Health

## 2020-10-13 LAB — URINE CULTURE
MICRO NUMBER:: 11967122
SPECIMEN QUALITY:: ADEQUATE

## 2020-10-13 MED ORDER — AMOXICILLIN-POT CLAVULANATE 500-125 MG PO TABS: 1.0000 | ORAL_TABLET | Freq: Two times a day (BID) | ORAL | 0 refills | Status: AC

## 2020-10-13 NOTE — Progress Notes (Signed)
I have sent prescription of Augmentin 500-125 mg 1 tab twice a day X 5 days to pharmacy. Urine culture had E coli isolate which is resistant to Cipro. Pls discontinue taking Cipro.

## 2020-10-14 ENCOUNTER — Other Ambulatory Visit: Payer: Self-pay | Admitting: Family

## 2020-11-16 ENCOUNTER — Emergency Department (HOSPITAL_COMMUNITY): Payer: Medicare HMO

## 2020-11-16 ENCOUNTER — Other Ambulatory Visit: Payer: Self-pay

## 2020-11-16 ENCOUNTER — Inpatient Hospital Stay (HOSPITAL_COMMUNITY)
Admission: EM | Admit: 2020-11-16 | Discharge: 2020-11-26 | DRG: 064 | Disposition: A | Discharge status: Skilled Nursing Facility | Payer: Medicare HMO | Attending: Internal Medicine | Admitting: Internal Medicine

## 2020-11-16 ENCOUNTER — Encounter (HOSPITAL_COMMUNITY): Payer: Self-pay

## 2020-11-16 DIAGNOSIS — R4701 Aphasia: Secondary | ICD-10-CM | POA: Diagnosis present

## 2020-11-16 DIAGNOSIS — G4700 Insomnia, unspecified: Secondary | ICD-10-CM | POA: Diagnosis present

## 2020-11-16 DIAGNOSIS — I69311 Memory deficit following cerebral infarction: Secondary | ICD-10-CM

## 2020-11-16 DIAGNOSIS — N3001 Acute cystitis with hematuria: Secondary | ICD-10-CM | POA: Diagnosis not present

## 2020-11-16 DIAGNOSIS — F321 Major depressive disorder, single episode, moderate: Secondary | ICD-10-CM

## 2020-11-16 DIAGNOSIS — I10 Essential (primary) hypertension: Secondary | ICD-10-CM | POA: Diagnosis present

## 2020-11-16 DIAGNOSIS — F0390 Unspecified dementia without behavioral disturbance: Secondary | ICD-10-CM | POA: Diagnosis present

## 2020-11-16 DIAGNOSIS — N183 Chronic kidney disease, stage 3 unspecified: Secondary | ICD-10-CM | POA: Diagnosis present

## 2020-11-16 DIAGNOSIS — E785 Hyperlipidemia, unspecified: Secondary | ICD-10-CM | POA: Diagnosis present

## 2020-11-16 DIAGNOSIS — N390 Urinary tract infection, site not specified: Secondary | ICD-10-CM | POA: Diagnosis present

## 2020-11-16 DIAGNOSIS — Z8249 Family history of ischemic heart disease and other diseases of the circulatory system: Secondary | ICD-10-CM

## 2020-11-16 DIAGNOSIS — Z823 Family history of stroke: Secondary | ICD-10-CM

## 2020-11-16 DIAGNOSIS — E7800 Pure hypercholesterolemia, unspecified: Secondary | ICD-10-CM | POA: Diagnosis present

## 2020-11-16 DIAGNOSIS — I672 Cerebral atherosclerosis: Secondary | ICD-10-CM | POA: Diagnosis present

## 2020-11-16 DIAGNOSIS — R7989 Other specified abnormal findings of blood chemistry: Secondary | ICD-10-CM | POA: Diagnosis present

## 2020-11-16 DIAGNOSIS — W19XXXA Unspecified fall, initial encounter: Secondary | ICD-10-CM

## 2020-11-16 DIAGNOSIS — I129 Hypertensive chronic kidney disease with stage 1 through stage 4 chronic kidney disease, or unspecified chronic kidney disease: Secondary | ICD-10-CM | POA: Diagnosis present

## 2020-11-16 DIAGNOSIS — Z8616 Personal history of COVID-19: Secondary | ICD-10-CM

## 2020-11-16 DIAGNOSIS — I6389 Other cerebral infarction: Principal | ICD-10-CM | POA: Diagnosis present

## 2020-11-16 DIAGNOSIS — Z20822 Contact with and (suspected) exposure to covid-19: Secondary | ICD-10-CM | POA: Diagnosis present

## 2020-11-16 DIAGNOSIS — W010XXA Fall on same level from slipping, tripping and stumbling without subsequent striking against object, initial encounter: Secondary | ICD-10-CM | POA: Diagnosis present

## 2020-11-16 DIAGNOSIS — N179 Acute kidney failure, unspecified: Secondary | ICD-10-CM | POA: Diagnosis present

## 2020-11-16 DIAGNOSIS — F32A Depression, unspecified: Secondary | ICD-10-CM | POA: Diagnosis present

## 2020-11-16 DIAGNOSIS — Z66 Do not resuscitate: Secondary | ICD-10-CM | POA: Diagnosis present

## 2020-11-16 DIAGNOSIS — Z801 Family history of malignant neoplasm of trachea, bronchus and lung: Secondary | ICD-10-CM

## 2020-11-16 DIAGNOSIS — I651 Occlusion and stenosis of basilar artery: Secondary | ICD-10-CM | POA: Diagnosis present

## 2020-11-16 DIAGNOSIS — R29701 NIHSS score 1: Secondary | ICD-10-CM | POA: Diagnosis present

## 2020-11-16 DIAGNOSIS — I4510 Unspecified right bundle-branch block: Secondary | ICD-10-CM | POA: Diagnosis present

## 2020-11-16 DIAGNOSIS — R4182 Altered mental status, unspecified: Secondary | ICD-10-CM

## 2020-11-16 DIAGNOSIS — Z79899 Other long term (current) drug therapy: Secondary | ICD-10-CM

## 2020-11-16 DIAGNOSIS — F419 Anxiety disorder, unspecified: Secondary | ICD-10-CM | POA: Diagnosis present

## 2020-11-16 DIAGNOSIS — B9620 Unspecified Escherichia coli [E. coli] as the cause of diseases classified elsewhere: Secondary | ICD-10-CM | POA: Diagnosis present

## 2020-11-16 DIAGNOSIS — E1122 Type 2 diabetes mellitus with diabetic chronic kidney disease: Secondary | ICD-10-CM | POA: Diagnosis present

## 2020-11-16 DIAGNOSIS — Z751 Person awaiting admission to adequate facility elsewhere: Secondary | ICD-10-CM

## 2020-11-16 DIAGNOSIS — Y92009 Unspecified place in unspecified non-institutional (private) residence as the place of occurrence of the external cause: Secondary | ICD-10-CM

## 2020-11-16 DIAGNOSIS — I639 Cerebral infarction, unspecified: Secondary | ICD-10-CM | POA: Diagnosis present

## 2020-11-16 DIAGNOSIS — Z7984 Long term (current) use of oral hypoglycemic drugs: Secondary | ICD-10-CM

## 2020-11-16 DIAGNOSIS — G9341 Metabolic encephalopathy: Secondary | ICD-10-CM | POA: Diagnosis present

## 2020-11-16 DIAGNOSIS — N1832 Chronic kidney disease, stage 3b: Secondary | ICD-10-CM | POA: Diagnosis present

## 2020-11-16 DIAGNOSIS — E119 Type 2 diabetes mellitus without complications: Secondary | ICD-10-CM

## 2020-11-16 DIAGNOSIS — E876 Hypokalemia: Secondary | ICD-10-CM | POA: Diagnosis not present

## 2020-11-16 DIAGNOSIS — Z91041 Radiographic dye allergy status: Secondary | ICD-10-CM

## 2020-11-16 LAB — URINALYSIS, ROUTINE W REFLEX MICROSCOPIC
Bilirubin Urine: NEGATIVE
Glucose, UA: NEGATIVE mg/dL
Ketones, ur: NEGATIVE mg/dL
Nitrite: NEGATIVE
Protein, ur: 100 mg/dL — AB
RBC / HPF: >50 RBC/hpf — ABNORMAL HIGH (ref 0–5)
Specific Gravity, Urine: 1.01 (ref 1.005–1.030)
WBC, UA: >50 WBC/hpf — ABNORMAL HIGH (ref 0–5)
pH: 7 (ref 5.0–8.0)

## 2020-11-16 LAB — CBC WITH DIFFERENTIAL/PLATELET
Abs Immature Granulocytes: 0.03 10*3/uL (ref 0.00–0.07)
Basophils Absolute: 0 10*3/uL (ref 0.0–0.1)
Basophils Relative: 1 %
Eosinophils Absolute: 0.1 10*3/uL (ref 0.0–0.5)
Eosinophils Relative: 1 %
HCT: 45.9 % (ref 36.0–46.0)
Hemoglobin: 15.2 g/dL — ABNORMAL HIGH (ref 12.0–15.0)
Immature Granulocytes: 0 %
Lymphocytes Relative: 11 %
Lymphs Abs: 0.8 10*3/uL (ref 0.7–4.0)
MCH: 31 pg (ref 26.0–34.0)
MCHC: 33.1 g/dL (ref 30.0–36.0)
MCV: 93.7 fL (ref 80.0–100.0)
Monocytes Absolute: 0.5 10*3/uL (ref 0.1–1.0)
Monocytes Relative: 6 %
Neutro Abs: 5.9 10*3/uL (ref 1.7–7.7)
Neutrophils Relative %: 81 %
Platelets: 244 10*3/uL (ref 150–400)
RBC: 4.9 MIL/uL (ref 3.87–5.11)
RDW: 13.2 % (ref 11.5–15.5)
WBC: 7.4 10*3/uL (ref 4.0–10.5)
nRBC: 0 % (ref 0.0–0.2)

## 2020-11-16 LAB — COMPREHENSIVE METABOLIC PANEL
ALT: 36 U/L (ref 0–44)
AST: 43 U/L — ABNORMAL HIGH (ref 15–41)
Albumin: 4.1 g/dL (ref 3.5–5.0)
Alkaline Phosphatase: 158 U/L — ABNORMAL HIGH (ref 38–126)
Anion gap: 9 (ref 5–15)
BUN: 16 mg/dL (ref 8–23)
CO2: 29 mmol/L (ref 22–32)
Calcium: 9.7 mg/dL (ref 8.9–10.3)
Chloride: 100 mmol/L (ref 98–111)
Creatinine, Ser: 1.43 mg/dL — ABNORMAL HIGH (ref 0.44–1.00)
GFR, Estimated: 38 mL/min — ABNORMAL LOW (ref >60)
Glucose, Bld: 149 mg/dL — ABNORMAL HIGH (ref 70–99)
Potassium: 3.7 mmol/L (ref 3.5–5.1)
Sodium: 138 mmol/L (ref 135–145)
Total Bilirubin: 0.9 mg/dL (ref 0.3–1.2)
Total Protein: 7.7 g/dL (ref 6.5–8.1)

## 2020-11-16 LAB — PROTIME-INR
INR: 0.9 (ref 0.8–1.2)
Prothrombin Time: 12.4 seconds (ref 11.4–15.2)

## 2020-11-16 LAB — RESP PANEL BY RT-PCR (FLU A&B, COVID) ARPGX2
Influenza A by PCR: NEGATIVE
Influenza B by PCR: NEGATIVE
SARS Coronavirus 2 by RT PCR: NEGATIVE

## 2020-11-16 LAB — TROPONIN I (HIGH SENSITIVITY)
Troponin I (High Sensitivity): 13 ng/L (ref <18)
Troponin I (High Sensitivity): 13 ng/L (ref <18)

## 2020-11-16 LAB — CBG MONITORING, ED: Glucose-Capillary: 152 mg/dL — ABNORMAL HIGH (ref 70–99)

## 2020-11-16 IMAGING — MR MR HEAD WO/W CM
15 of 16 series · 44 of 48 positions shown · IV contrast (gadavist)
Comparison: Same day CT head and MRI from [DATE].

EXAM:
MRI HEAD WITHOUT AND WITH CONTRAST
TECHNIQUE: Multiplanar, multiecho pulse sequences of the brain and surrounding
structures were obtained without and with intravenous contrast.

CONTRAST:  6mL GADAVIST GADOBUTROL 1 MMOL/ML IV SOLN

[Series 5: DWI · axial · 3.0mm · 1.36mm/px · z∈[-60,+80]mm · 6 of 96 slices shown (1 of 4)]
[im 1/96]
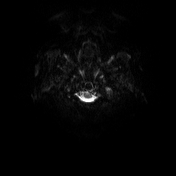
[im 20/96]
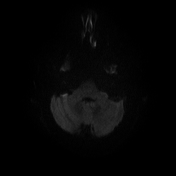
[im 39/96]
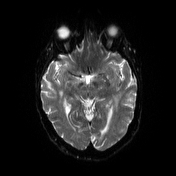
[im 58/96]
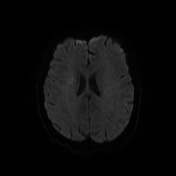
[im 77/96]
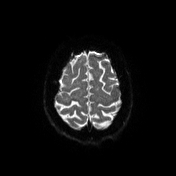
[im 96/96]
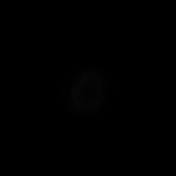

[Series 6: DWI · axial · 3.0mm · 1.36mm/px · z∈[-60,+80]mm · 4 of 48 slices shown (2 of 4)]
[im 1/48]
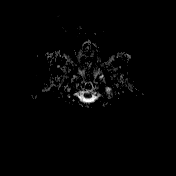
[im 16/48]
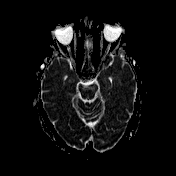
[im 32/48]
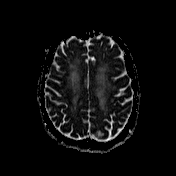
[im 48/48]
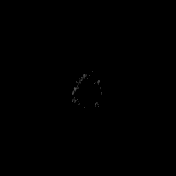

[Series 7: T1 · sagittal · 5.0mm · 0.75mm/px · 2 of 26 slices shown]
[im 1/26]
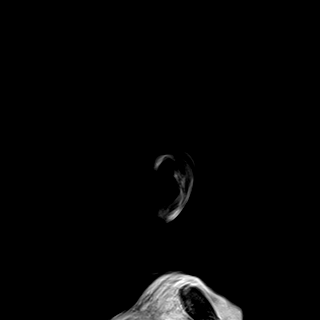
[im 26/26]
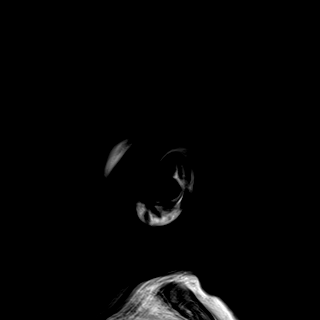

[Series 8: T2 · axial · 5.0mm · 0.62mm/px · z∈[-81,+80]mm · 2 of 26 slices shown (1 of 4)]
[im 1/26]
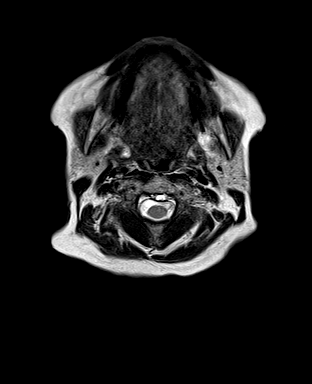
[im 26/26]
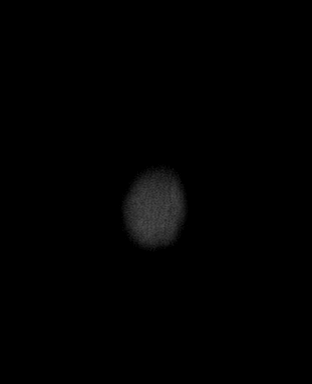

[Series 9: GRE · axial · 3.0mm · 0.45mm/px · z∈[-72,+82]mm · 4 of 53 slices shown]
[im 1/53]
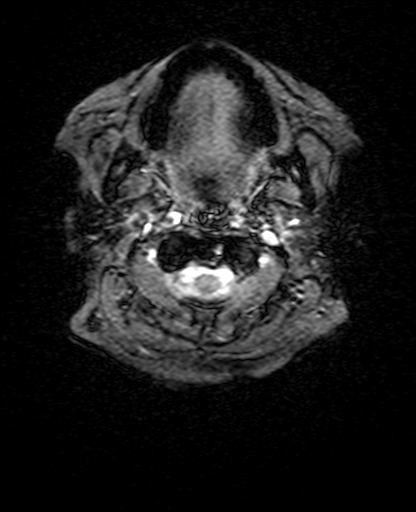
[im 18/53]
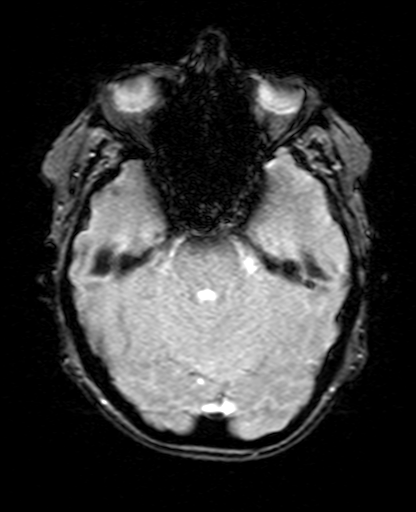
[im 35/53]
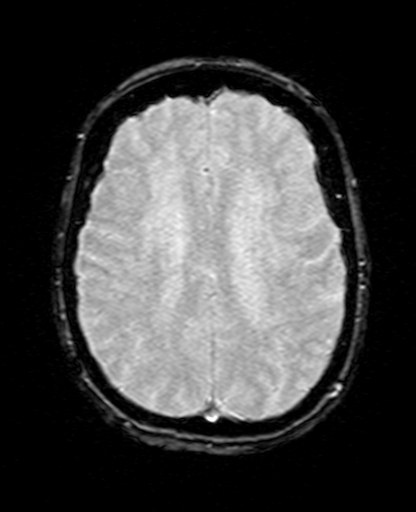
[im 53/53]
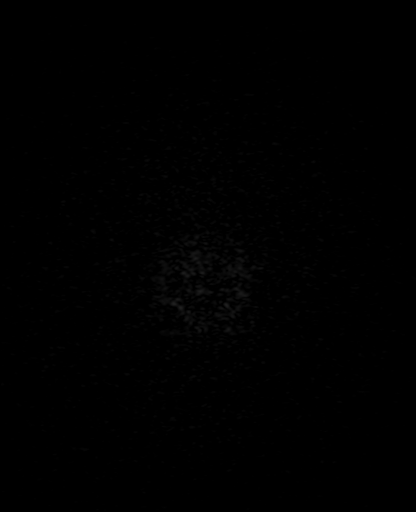

[Series 10: T2 · sagittal · 5.0mm · 0.47mm/px · 2 of 25 slices shown (2 of 4)]
[im 1/25]
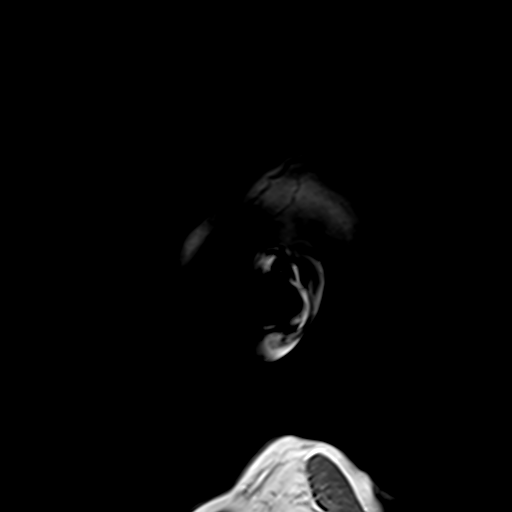
[im 25/25]
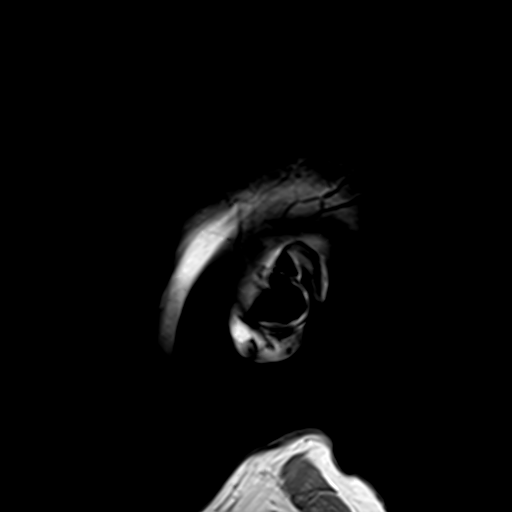

[Series 11: FLAIR · axial · 3.0mm · 0.86mm/px · z∈[-69,+80]mm · 4 of 51 slices shown]
[im 1/51]
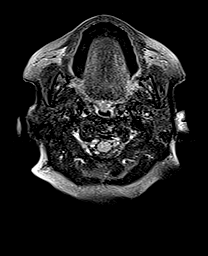
[im 17/51]
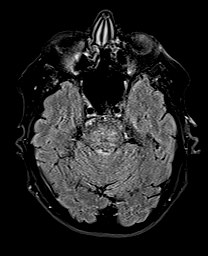
[im 34/51]
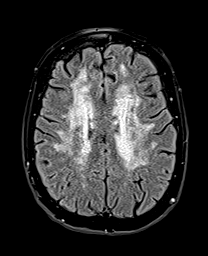
[im 51/51]
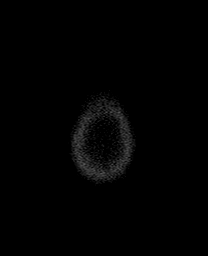

[Series 13: DWI · coronal · 5.0mm · 1.31mm/px · 4 of 59 slices shown (3 of 4)]
[im 1/59]
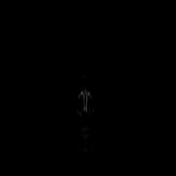
[im 20/59]
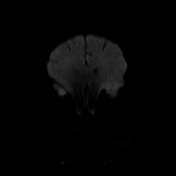
[im 39/59]
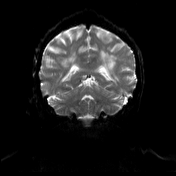
[im 59/59]
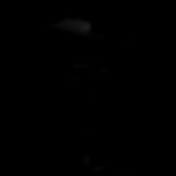

[Series 14: DWI · coronal · 5.0mm · 1.31mm/px · 2 of 29 slices shown (4 of 4)]
[im 1/29]
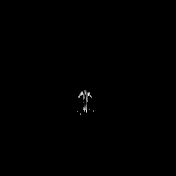
[im 29/29]
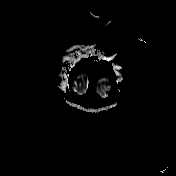

[Series 15: T2 · coronal · 5.0mm · 0.86mm/px · 2 of 30 slices shown (3 of 4)]
[im 1/30]
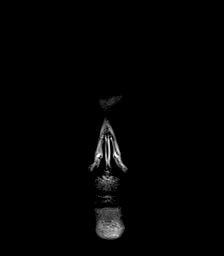
[im 30/30]
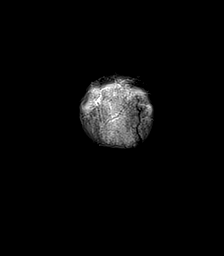

[Series 16: T2 · axial · 5.0mm · 0.62mm/px · z∈[-81,+80]mm · 2 of 26 slices shown (4 of 4)]
[im 1/26]
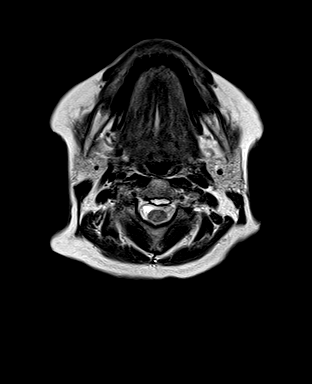
[im 26/26]
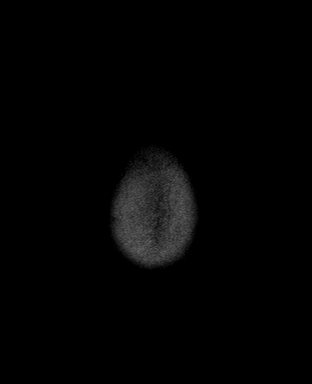

[Series 17: T1 post-contrast · axial · 3.0mm · 0.45mm/px · z∈[-65,+84]mm · 4 of 51 slices shown (1 of 4)]
[im 1/51]
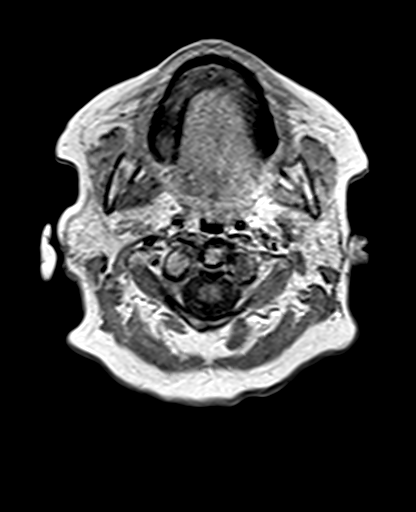
[im 17/51]
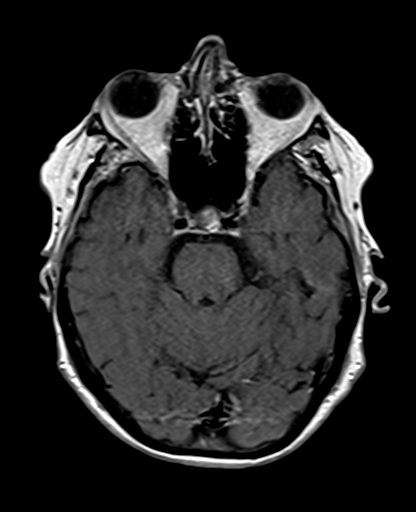
[im 34/51]
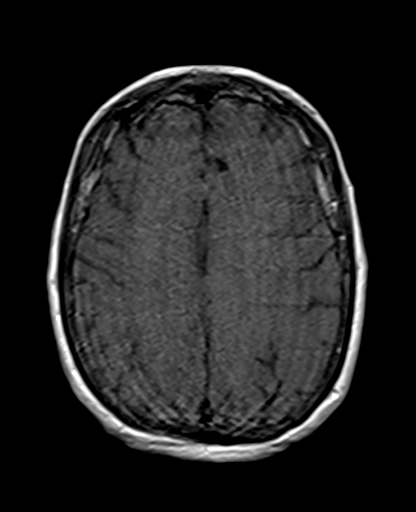
[im 51/51]
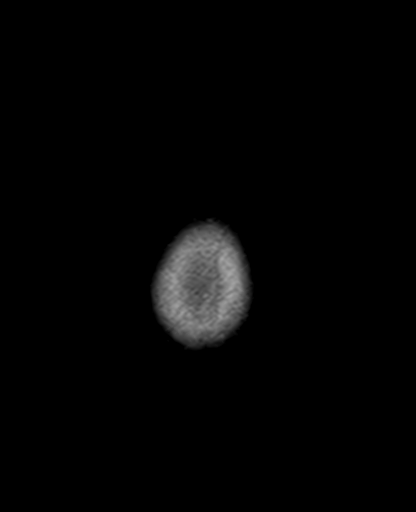

[Series 18: T1 post-contrast · coronal · 5.0mm · 0.43mm/px · 2 of 30 slices shown (2 of 4)]
[im 1/30]
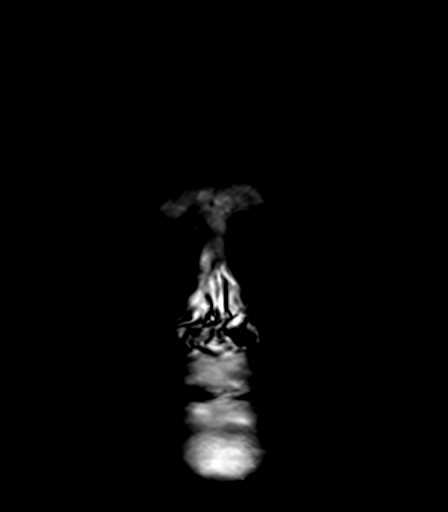
[im 30/30]
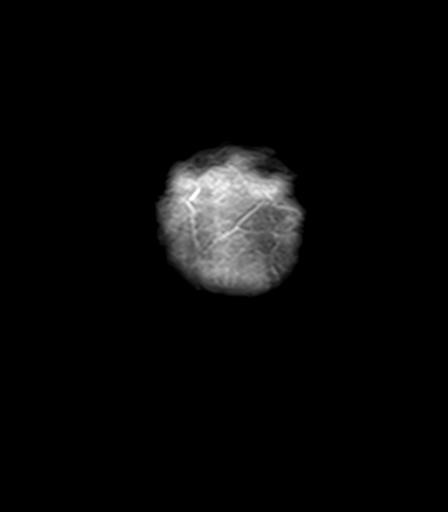

[Series 19: T1 post-contrast · sagittal · 5.0mm · 0.94mm/px · 2 of 25 slices shown (3 of 4)]
[im 1/25]
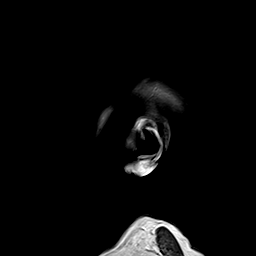
[im 25/25]
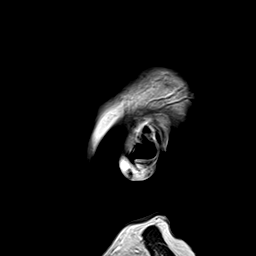

[Series 20: T1 post-contrast · sagittal · 5.0mm · 0.94mm/px · 2 of 25 slices shown (4 of 4)]
[im 1/25]
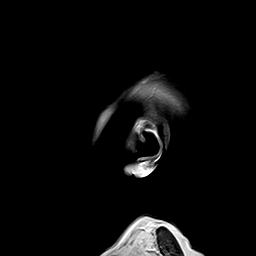
[im 25/25]
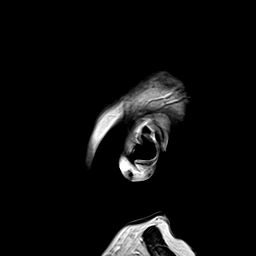

[44 of 48 positions shown; findings below may reference images not displayed]

FINDINGS: Motion limited evaluation.  Within this limitation:

Brain: Restricted diffusion in the juxtacortical right frontal lobe
without enhancement, compatible with acute infarct. Additional
milder DWI hyperintensity with milder restricted diffusion in the
left aspect of the splenium of the corpus callosum and left
occipital lobe. Evolution of the previously seen right basal ganglia
and corona radiata infarcts with encephalomalacia in these areas.
There is extensive patchy and confluent additional T2/FLAIR
hyperintensities within the supratentorial and pontine white matter,
most likely related to chronic microvascular ischemic disease. Small
remote infarct in the left frontal white matter. No hydrocephalus.
Wallerian degeneration of the right midbrain. No evidence of acute
hemorrhage. No extra-axial fluid collection. No abnormal
enhancement.

Vascular: Poor left vertebral artery flow void, likely related to
the occluded left vertebral artery is seen on prior MRA. The other
major arterial flow voids are maintained at the skull base.

Skull and upper cervical spine: Normal marrow signal.

Sinuses/Orbits: Visualized sinuses are clear. No acute orbital
findings.

Other: Small left mastoid effusion.
IMPRESSION: 1. Acute infarct in the juxtacortical right frontal lobe. Additional
areas of milder restricted diffusion in the left aspect of the
splenium of the corpus callosum and the left occipital lobe may
represent subacute infarcts.
2. Evolution of the previously seen right basal ganglia and corona
radiata infarcts with encephalomalacia in these areas. The
3. Severe chronic microvascular ischemic disease.

Poor left vertebral artery flow void, likely related to the occluded
left vertebral artery is seen on prior MRA.

## 2020-11-16 IMAGING — CT CT HEAD W/O CM
3 series · 14 of 47 positions shown, 16 images · non-contrast
Comparison: [DATE]

CLINICAL DATA: Fall, altered mental status, confusion, headache

EXAM:
CT HEAD WITHOUT CONTRAST
TECHNIQUE: Contiguous axial images were obtained from the base of the skull
through the vertex without intravenous contrast.

[Series 2: head wo · axial · 0.47mm/px · z∈[-142,-7]mm · 8 of 33 slices shown, 10 images]
[im 3/33  brain]
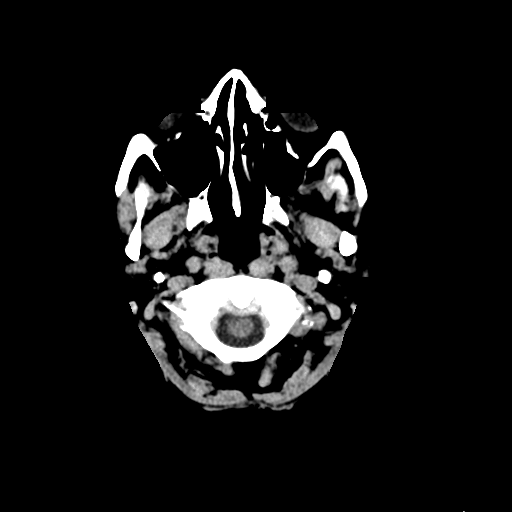
[im 3/33  bone]
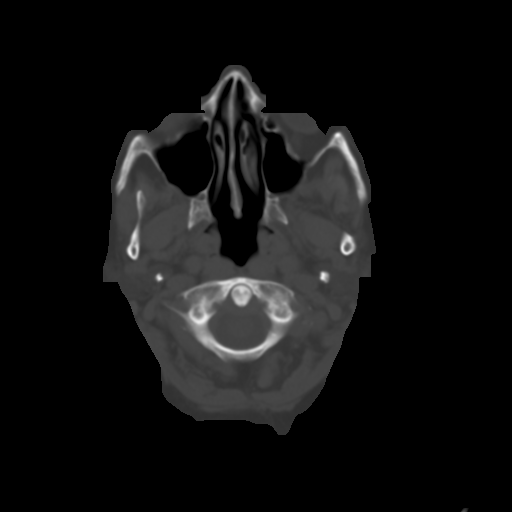
[im 7/33  brain]
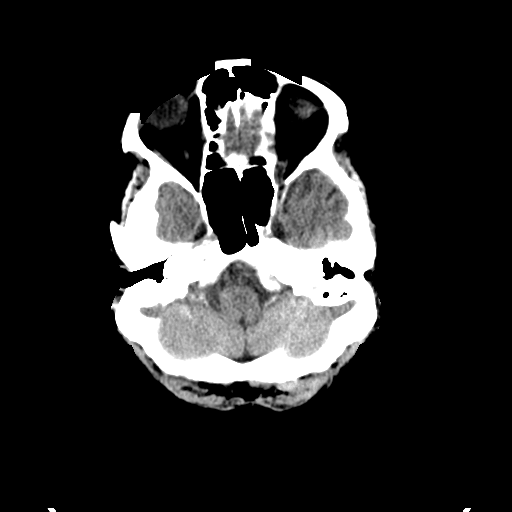
[im 10/33  brain]
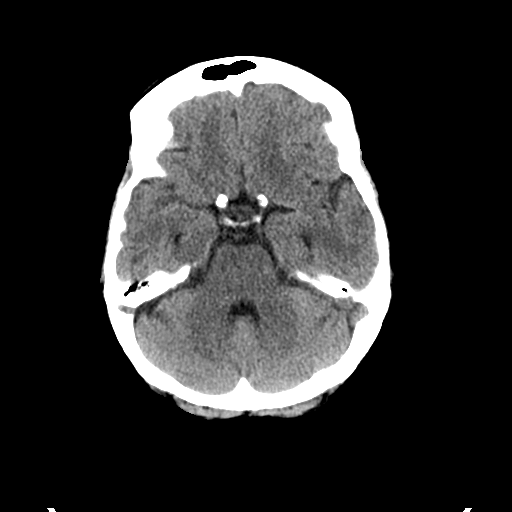
[im 15/33  brain]
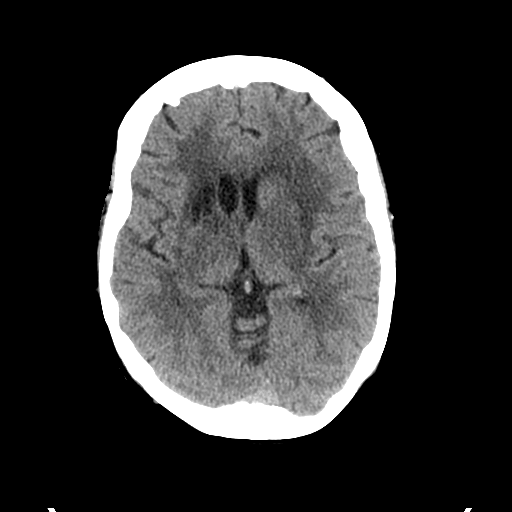
[im 18/33  brain]
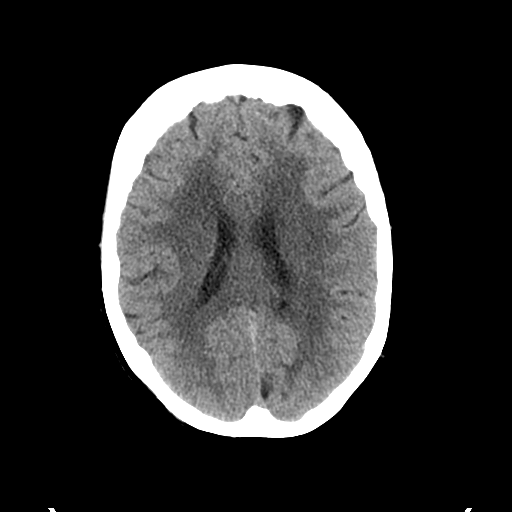
[im 18/33  bone]
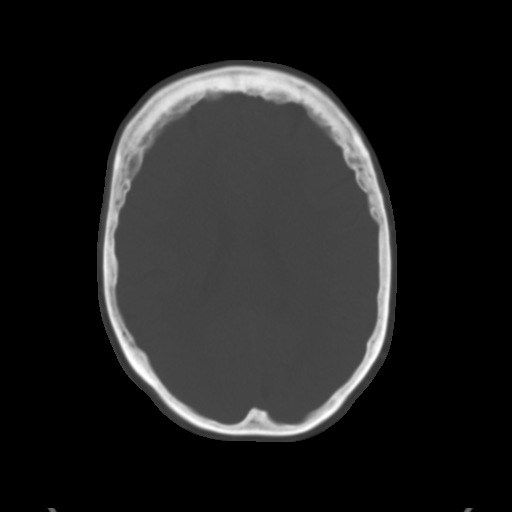
[im 23/33  brain]
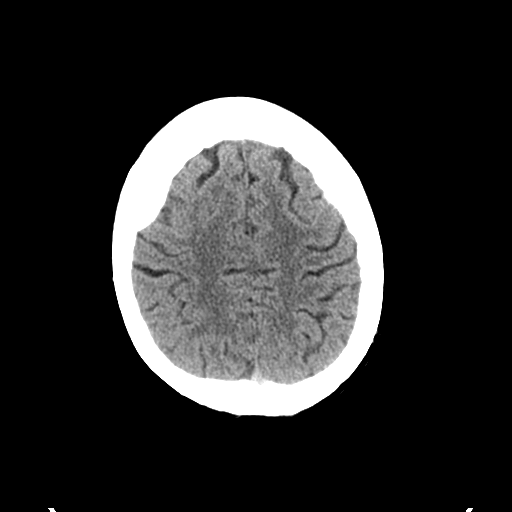
[im 26/33  brain]
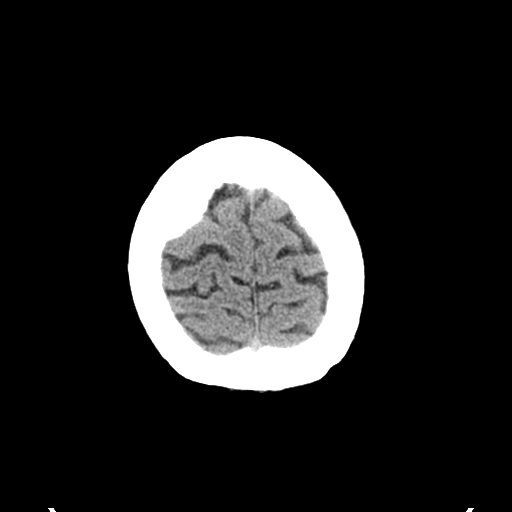
[im 30/33  brain]
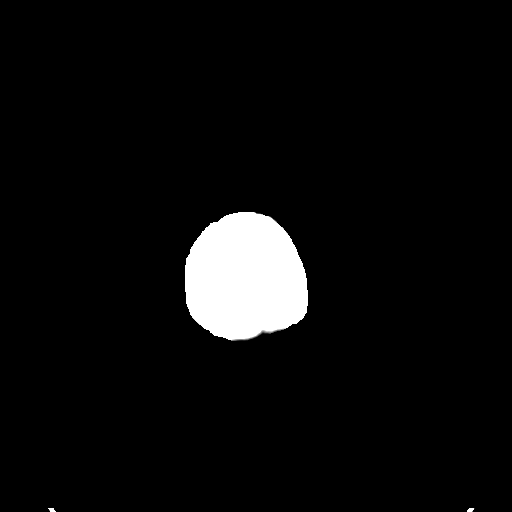

[Series 5: coronal soft tissue · coronal · 0.32mm/px · 3 of 66 slices shown]
[im 22/66  brain]
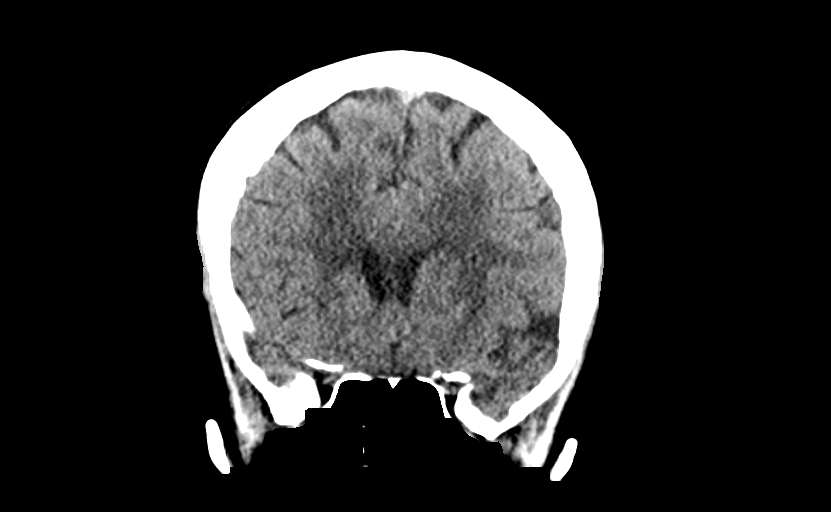
[im 29/66  brain]
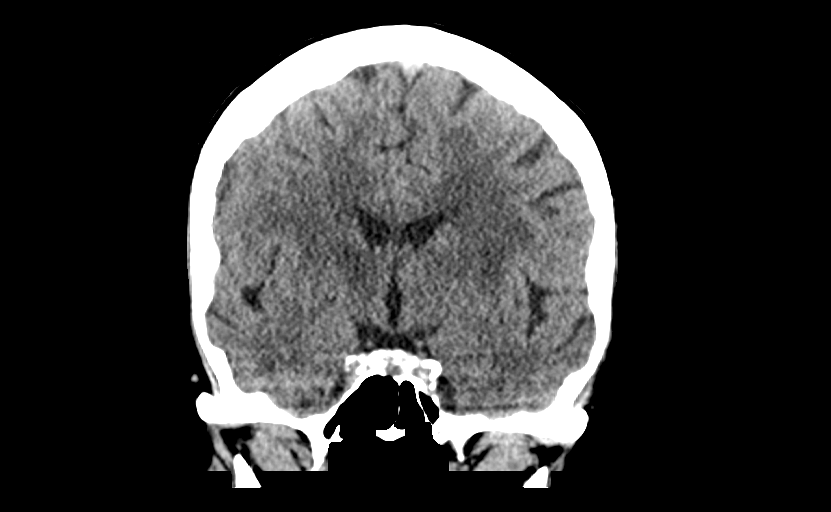
[im 37/66  brain]
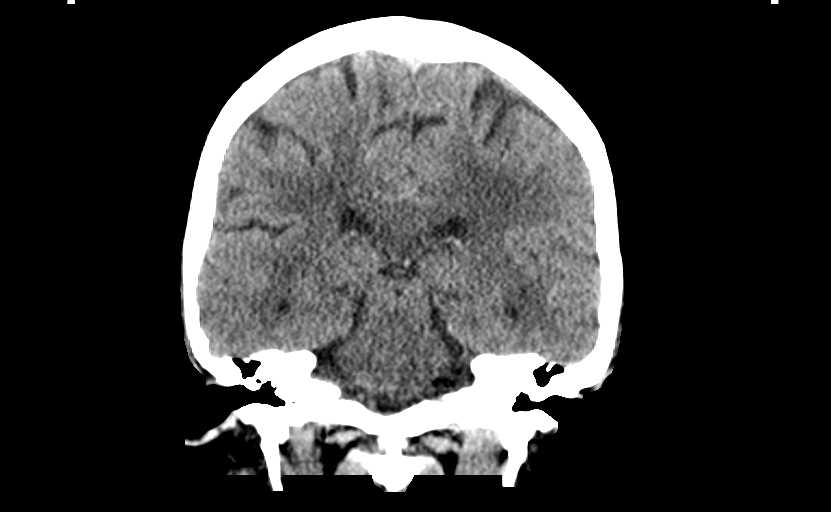

[Series 6: sagittal soft tissue · sagittal · 0.34mm/px · 3 of 52 slices shown]
[im 18/52  brain]
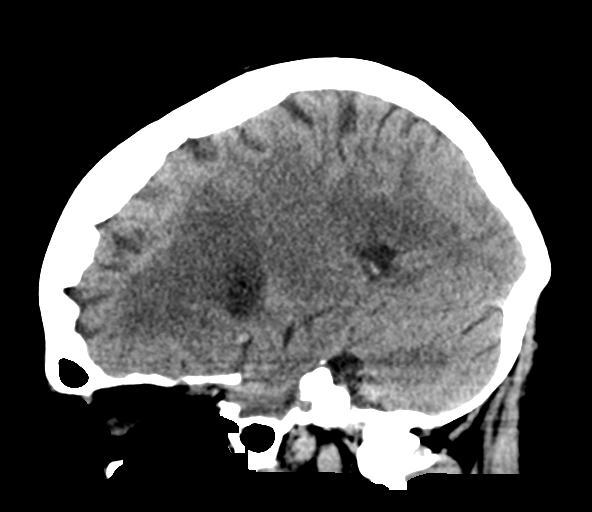
[im 26/52  brain]
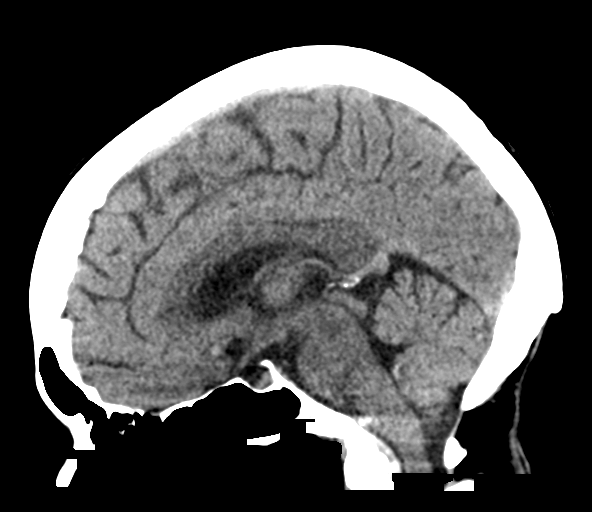
[im 35/52  brain]
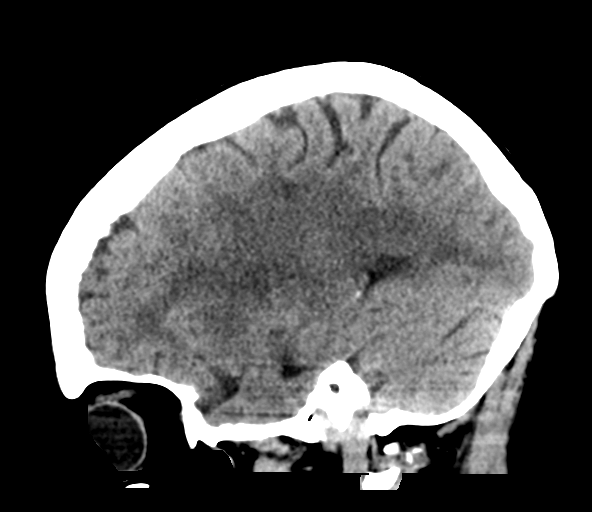

[14 of 47 positions shown; findings below may reference images not displayed]

FINDINGS: Brain: No evidence of acute infarction, hemorrhage, hydrocephalus,
extra-axial collection or mass lesion/mass effect. Extensive
periventricular and deep white matter hypodensity. Encephalomalacia
of the right caudate and adjacent corona radiata (series 2, image
15).

Vascular: No hyperdense vessel or unexpected calcification.

Skull: Normal. Negative for fracture or focal lesion.

Sinuses/Orbits: No acute finding.

Other: None.
IMPRESSION: 1. No acute intracranial pathology.
2. Advanced small-vessel white matter disease. Encephalomalacia of
the right caudate and adjacent corona radiata, in keeping with prior
infarction.

## 2020-11-16 MED ORDER — SENNOSIDES-DOCUSATE SODIUM 8.6-50 MG PO TABS: 1.0000 | ORAL_TABLET | Freq: Every evening | ORAL | Status: DC | PRN

## 2020-11-16 MED ORDER — SODIUM CHLORIDE 0.9 % IV SOLN
1.0000 g | INTRAVENOUS | Status: DC
  Administered 2020-11-17 – 2020-11-18 (×2): 1 g via INTRAVENOUS
  Filled 2020-11-16 (×2): qty 10

## 2020-11-16 MED ORDER — LORAZEPAM 2 MG/ML IJ SOLN: 0.5000 mg | Freq: Once | INTRAMUSCULAR | Status: DC | PRN

## 2020-11-16 MED ORDER — METOPROLOL TARTRATE 25 MG PO TABS
25.0000 mg | ORAL_TABLET | Freq: Once | ORAL | Status: AC
  Administered 2020-11-16: 25 mg via ORAL
  Filled 2020-11-16: qty 1

## 2020-11-16 MED ORDER — METOPROLOL TARTRATE 25 MG PO TABS
25.0000 mg | ORAL_TABLET | Freq: Two times a day (BID) | ORAL | Status: DC
  Administered 2020-11-17: 25 mg via ORAL
  Filled 2020-11-16: qty 1

## 2020-11-16 MED ORDER — ASPIRIN EC 81 MG PO TBEC
81.0000 mg | DELAYED_RELEASE_TABLET | Freq: Every day | ORAL | Status: DC
  Administered 2020-11-16 – 2020-11-18 (×3): 81 mg via ORAL
  Filled 2020-11-16 (×3): qty 1

## 2020-11-16 MED ORDER — INSULIN ASPART 100 UNIT/ML IJ SOLN
0.0000 [IU] | Freq: Three times a day (TID) | INTRAMUSCULAR | Status: DC
  Administered 2020-11-17: 2 [IU] via SUBCUTANEOUS
  Administered 2020-11-18 (×2): 1 [IU] via SUBCUTANEOUS
  Administered 2020-11-18: 2 [IU] via SUBCUTANEOUS
  Administered 2020-11-19: 1 [IU] via SUBCUTANEOUS
  Administered 2020-11-19: 3 [IU] via SUBCUTANEOUS
  Administered 2020-11-20: 2 [IU] via SUBCUTANEOUS
  Administered 2020-11-20: 1 [IU] via SUBCUTANEOUS
  Administered 2020-11-21: 2 [IU] via SUBCUTANEOUS
  Administered 2020-11-22: 5 [IU] via SUBCUTANEOUS
  Administered 2020-11-22: 2 [IU] via SUBCUTANEOUS
  Administered 2020-11-22: 1 [IU] via SUBCUTANEOUS
  Administered 2020-11-23: 2 [IU] via SUBCUTANEOUS
  Administered 2020-11-23: 3 [IU] via SUBCUTANEOUS
  Administered 2020-11-24: 2 [IU] via SUBCUTANEOUS
  Administered 2020-11-24: 1 [IU] via SUBCUTANEOUS
  Administered 2020-11-25: 2 [IU] via SUBCUTANEOUS
  Administered 2020-11-25 (×2): 3 [IU] via SUBCUTANEOUS
  Administered 2020-11-26 (×2): 2 [IU] via SUBCUTANEOUS
  Filled 2020-11-16: qty 0.09

## 2020-11-16 MED ORDER — LOSARTAN POTASSIUM 50 MG PO TABS
100.0000 mg | ORAL_TABLET | Freq: Every day | ORAL | Status: DC
  Administered 2020-11-17: 100 mg via ORAL
  Filled 2020-11-16: qty 4

## 2020-11-16 MED ORDER — GADOBUTROL 1 MMOL/ML IV SOLN
6.0000 mL | Freq: Once | INTRAVENOUS | Status: AC | PRN
  Administered 2020-11-16: 6 mL via INTRAVENOUS

## 2020-11-16 MED ORDER — SERTRALINE HCL 50 MG PO TABS
25.0000 mg | ORAL_TABLET | Freq: Every day | ORAL | Status: DC
  Administered 2020-11-17 – 2020-11-26 (×10): 25 mg via ORAL
  Filled 2020-11-16: qty 1
  Filled 2020-11-16: qty 0.5
  Filled 2020-11-16 (×8): qty 1

## 2020-11-16 MED ORDER — SODIUM CHLORIDE 0.9 % IV SOLN
1.0000 g | Freq: Once | INTRAVENOUS | Status: AC
  Administered 2020-11-16: 1 g via INTRAVENOUS
  Filled 2020-11-16: qty 10

## 2020-11-16 MED ORDER — STROKE: EARLY STAGES OF RECOVERY BOOK
Freq: Once | Status: DC
  Filled 2020-11-16: qty 1

## 2020-11-16 MED ORDER — LOSARTAN POTASSIUM 25 MG PO TABS
100.0000 mg | ORAL_TABLET | Freq: Once | ORAL | Status: AC
  Administered 2020-11-16: 100 mg via ORAL
  Filled 2020-11-16: qty 4

## 2020-11-16 MED ORDER — ENOXAPARIN SODIUM 30 MG/0.3ML IJ SOSY
30.0000 mg | PREFILLED_SYRINGE | INTRAMUSCULAR | Status: DC
  Administered 2020-11-16 – 2020-11-26 (×11): 30 mg via SUBCUTANEOUS
  Filled 2020-11-16 (×11): qty 0.3

## 2020-11-16 MED ORDER — ACETAMINOPHEN 325 MG PO TABS
650.0000 mg | ORAL_TABLET | ORAL | Status: DC | PRN
  Administered 2020-11-19: 650 mg via ORAL
  Filled 2020-11-16: qty 2

## 2020-11-16 MED ORDER — HYDRALAZINE HCL 20 MG/ML IJ SOLN
5.0000 mg | Freq: Three times a day (TID) | INTRAMUSCULAR | Status: DC | PRN
  Administered 2020-11-16: 5 mg via INTRAVENOUS
  Filled 2020-11-16: qty 1

## 2020-11-16 MED ORDER — ATORVASTATIN CALCIUM 40 MG PO TABS
40.0000 mg | ORAL_TABLET | Freq: Every day | ORAL | Status: DC
  Administered 2020-11-16 – 2020-11-17 (×2): 40 mg via ORAL
  Filled 2020-11-16 (×2): qty 1

## 2020-11-16 MED ORDER — ACETAMINOPHEN 650 MG RE SUPP: 650.0000 mg | RECTAL | Status: DC | PRN

## 2020-11-16 MED ORDER — ACETAMINOPHEN 160 MG/5ML PO SOLN: 650.0000 mg | ORAL | Status: DC | PRN

## 2020-11-16 MED ORDER — CLOPIDOGREL BISULFATE 300 MG PO TABS
300.0000 mg | ORAL_TABLET | Freq: Once | ORAL | Status: AC
  Administered 2020-11-16: 300 mg via ORAL
  Filled 2020-11-16: qty 1

## 2020-11-16 NOTE — ED Notes (Signed)
Pt in MRI.

## 2020-11-16 NOTE — ED Notes (Signed)
Pt wet on arrival to room. Pt wet clothes placed in patient belonging bag and given to pt daughter. Pt placed in a gown. Peri care provided with NT bedside. Pt bed pad and diaper changed. Purewick placed on patient. Socks placed on pt and pt given warm blanket.

## 2020-11-16 NOTE — ED Provider Notes (Signed)
Patient signed to me pending results of MRI which did show acute CVA.  Urinalysis also showed evidence of infection.  Rocephin given by prior provider.  Will consult neurology and admit to the hospitalist service   Lorre Nick, MD 11/16/20 1720

## 2020-11-16 NOTE — ED Provider Notes (Addendum)
[facility]-EMERGENCY DEPT Provider Note   CSN: [id number] Arrival date & time: 11/16/20  1126     History No chief complaint on file.   Heather [name] is a 80 y.o. female.  HPI Patient daughter reports that patient started acting kind of strange yesterday.  She seemed confused and she was doing things like trying to put her shirt on her legs like socks.  She was then asked fairly normal.  Her daughter did not note any specific weakness in her gait or ability to do her ADLs.  Patient does have some problems with dementia and memory loss since her stroke last year.  Patient is not aware of the problem.  She reports that she forgets things but she feels all right.  Patient's daughter reports that this seemed most reminiscent of when the patient had a stroke last year.  Patient's daughter reports that today she went in her to her mother's room and found her face down in the closet with her pants down around her ankles and her shirt halfway off.  Patient reports that she could not get back up because she had fallen onto her knees and her pants were around her legs.  The patient was not interested in coming to the emergency department but the patient's daughter reports that given her fall and confusion she was concern for either urinary tract infection or a stroke.  Patient has no pain complaints.  She reports she is sore on her knees but otherwise does not have pain.  No chest pain or headache.    Past Medical History:  Diagnosis Date   Diabetes mellitus without complication (HCC)    High cholesterol    History of stroke 2021   Hypertension     Patient Active Problem List   Diagnosis Date Noted   Essential hypertension 02/13/2020   Hyperlipidemia LDL goal <100 02/13/2020   Type 2 diabetes mellitus without complication, without long-term current use of insulin (HCC) 02/13/2020   Stroke (HCC) 01/17/2020   History of stroke 2021    History reviewed. No pertinent  surgical history.   OB History     Gravida  3   Para  3   Term  0   Preterm  0   AB  0   Living         SAB  0   IAB  0   Ectopic  0   Multiple      Live Births              Family History  Problem Relation Age of Onset   Stroke Mother    Lung cancer Father    Cancer Daughter    Hypertension Daughter    High Cholesterol Daughter     Social History   Tobacco Use   Smoking status: Never   Smokeless tobacco: Never  Vaping Use   Vaping Use: Never used  Substance Use Topics   Alcohol use: No   Drug use: No    Home Medications Prior to Admission medications   Medication Sig Start Date End Date Taking? Authorizing Provider  acetaminophen (TYLENOL) 500 MG tablet Take 500 mg by mouth every 6 (six) hours as needed for mild pain, fever or headache.   Yes [provider]  Ascorbic Acid (VITAMIN C PO) Take 1 tablet by mouth daily.   Yes [provider]  atorvastatin (LIPITOR) 40 MG tablet Take 40 mg by mouth daily.   Yes [provider]  fluticasone (FLONASE) 50 MCG/ACT nasal spray Place 1-2 sprays into both nostrils daily as needed for allergies or rhinitis.   Yes [provider]  losartan (COZAAR) 100 MG tablet Take 1 tablet (100 mg total) by mouth daily. 07/24/20  Yes Ngetich, Dinah C, NP  metFORMIN (GLUCOPHAGE) 500 MG tablet Take 1 tablet by mouth once daily Patient taking differently: Take 500 mg by mouth daily. 09/20/20  Yes Ngetich, Dinah C, NP  metoprolol tartrate (LOPRESSOR) 25 MG tablet Take 1 tablet by mouth twice daily Patient taking differently: Take 25 mg by mouth 2 (two) times daily. 10/14/20  Yes Sharee Holster, NP  Multiple Vitamins-Minerals (ZINC PO) Take 1 tablet by mouth daily.   Yes [provider]  sertraline (ZOLOFT) 25 MG tablet TAKE 1 TABLET BY MOUTH AT BEDTIME Patient taking differently: Take 25 mg by mouth daily. 07/29/20  Yes Ngetich, Dinah C, NP  Melatonin 10 MG TABS Take 10 mg by mouth at  bedtime as needed. Patient not taking: Reported on 11/16/2020 10/11/20   Medina-Vargas, Monina C, NP    Allergies    Iohexol and Iohexol  Review of Systems   Review of Systems 10 systems reviewed and negative except as per HPI Physical Exam Updated Vital Signs BP (!) 182/93   Pulse 64   Temp 98 F (36.7 C) (Oral)   Resp 16   Wt 61.7 kg   SpO2 100%   BMI 26.56 kg/m   Physical Exam Constitutional:      Comments: Patient is alert and nontoxic.  She is answering questions.  She is pleasantly interactive with me.  No respiratory distress.  HENT:     Head: Normocephalic and atraumatic.     Nose: Nose normal.     Mouth/Throat:     Mouth: Mucous membranes are moist.     Pharynx: Oropharynx is clear.  Eyes:     Extraocular Movements: Extraocular movements intact.     Pupils: Pupils are equal, round, and reactive to light.  Cardiovascular:     Rate and Rhythm: Normal rate and regular rhythm.  Pulmonary:     Effort: Pulmonary effort is normal.     Breath sounds: Normal breath sounds.  Abdominal:     General: There is no distension.     Palpations: Abdomen is soft.     Tenderness: There is no abdominal tenderness. There is no guarding.  Musculoskeletal:        General: Normal range of motion.     Cervical back: Neck supple.     Comments: Very minor contusions to both knees with no effusions.  I can put lower extremities through full range of motion with deep flexion at the knee ankle and hip with patient pushing against resistance without pain.  Skin:    General: Skin is warm and dry.  Neurological:     Comments: Patient is alert and appropriate.  She is answering questions situationally appropriate.  She does have some recall deficits.  He is following commands appropriately for upper and lower motor testing.  5\5 upper and lower.  No deficit to light touch.  Psychiatric:        Mood and Affect: Mood normal.    ED Results / Procedures / Treatments   Labs (all labs ordered  are listed, but only abnormal results are displayed) Labs Reviewed  COMPREHENSIVE METABOLIC PANEL - Abnormal; Notable for the following components:      Result Value   Glucose, Bld 149 (*)  Creatinine, Ser 1.43 (*)    AST 43 (*)    Alkaline Phosphatase 158 (*)    GFR, Estimated 38 (*)    All other components within normal limits  CBC WITH DIFFERENTIAL/PLATELET - Abnormal; Notable for the following components:   Hemoglobin 15.2 (*)    All other components within normal limits  URINALYSIS, ROUTINE W REFLEX MICROSCOPIC - Abnormal; Notable for the following components:   APPearance CLOUDY (*)    Hgb urine dipstick SMALL (*)    Protein, ur 100 (*)    Leukocytes,Ua LARGE (*)    RBC / HPF >50 (*)    WBC, UA >50 (*)    Bacteria, UA MANY (*)    All other components within normal limits  RESP PANEL BY RT-PCR (FLU A&B, COVID) [id number]  URINE CULTURE  PROTIME-INR  TROPONIN I (HIGH SENSITIVITY)  TROPONIN I (HIGH SENSITIVITY)    EKG None  Radiology CT Head Wo Contrast  Result Date: 11/16/2020 CLINICAL DATA:  Fall, altered mental status, confusion, headache EXAM: CT HEAD WITHOUT CONTRAST TECHNIQUE: Contiguous axial images were obtained from the base of the skull through the vertex without intravenous contrast. COMPARISON:  01/17/2020 FINDINGS: Brain: No evidence of acute infarction, hemorrhage, hydrocephalus, extra-axial collection or mass lesion/mass effect. Extensive periventricular and deep white matter hypodensity. Encephalomalacia of the right caudate and adjacent corona radiata (series 2, image 15). Vascular: No hyperdense vessel or unexpected calcification. Skull: Normal. Negative for fracture or focal lesion. Sinuses/Orbits: No acute finding. Other: None. IMPRESSION: 1. No acute intracranial pathology. 2. Advanced small-vessel white matter disease. Encephalomalacia of the right caudate and adjacent corona radiata, in keeping with prior infarction. Electronically Signed   By: Lauralyn Primes  M.D.   On: 11/16/2020 14:23    Procedures Procedures   Medications Ordered in ED Medications  LORazepam (ATIVAN) injection 0.5 mg (has no administration in time range)  cefTRIAXone (ROCEPHIN) 1 g in sodium chloride 0.9 % 100 mL IVPB (has no administration in time range)  losartan (COZAAR) tablet 100 mg (100 mg Oral Given 11/16/20 1452)  metoprolol tartrate (LOPRESSOR) tablet 25 mg (25 mg Oral Given 11/16/20 1453)  gadobutrol (GADAVIST) 1 MMOL/ML injection 6 mL (6 mLs Intravenous Contrast Given 11/16/20 1555)    ED Course  I have reviewed the triage vital signs and the nursing notes.  Pertinent labs & imaging results that were available during my care of the patient were reviewed by me and considered in my medical decision making (see chart for details).    MDM Rules/Calculators/A&P                          Patient is alert.  She is nontoxic in appearance.  She has however exhibited unusual behaviors and confusion observed by her daughter over greater than 24 hours.  No time of onset for possible CVA.  No focal deficits.  Patient does not seem to have aphasia, visual deficit or motor deficit.  At this time, we will proceed with CT head.  Patient did have a fall.  No objective injuries.  CT head without acute findings.  Urinalysis is grossly.  We will start treatment with Rocephin, culture.  Patient is also getting MRI to rule out any subacute or acute stroke.  If negative, anticipate discharge with antibiotic.  Dr. Freida Busman excepting patient at shift transfer for completion of assessment and care. Final Clinical Impression(s) / ED Diagnoses Final diagnoses:  Altered mental status, unspecified altered mental status  type  Acute cystitis with hematuria  Fall, initial encounter    Rx / DC Orders ED Discharge Orders     None        Arby BarrettePfeiffer, Meosha Castanon, MD 11/16/20 1555    Arby BarrettePfeiffer, Koah Chisenhall, MD 11/16/20 1556

## 2020-11-16 NOTE — ED Triage Notes (Signed)
Pt daughter states pt was being very altered last night. Pt had a fall this morning in her closet. Pt daughter states pt is "talking crazy and I don't know if it is a UTI or a stroke. She had a stroke last year."   Pt daughter states pt is not picking her feet up when she walks. Also reports elevated BP last night but not as high as it is right now.   Fall was from standing where pt fell forward. Unknown if pt hit her head. Pt daughter reports pt has had a headache but pt currently denies headache.

## 2020-11-16 NOTE — H&P (Signed)
History and Physical    Heather DodrillRuth E Glennon WUJ:[id number] DOB: 19-Nov-1940 DOA: 11/16/2020  PCP: Caesar BookmanNgetich, Dinah C, NP Consultants:  neurology:  Patient coming from:  Home - lives with daughter and her son in law.   Chief Complaint: fall   HPI: Heather DodrillRuth E [name] is a 80 y.o. female with medical history significant of HTN, HLD, past ischemic stroke in 01/17/20 (right basal ganglia and adjacent white matter as well as small infarcts of left internal capsule and lentiform nucleus. History obtained from daughter as her speech doesn't make sense/aphasic.. LKW: a couple of weeks ago per daughter. (Had covid 3 weeks ago and hasn't seemed the same since this time). Her daughter states she has not been acting right the last few days. She will say things off the wall that don't make sense. She also had a shirt buttoned around her leg last night. Today she was in her closet and tripped and fell right on her face. When her daughter found her she had her  shirt half off and her pants were down around her knees. Unknown if she had syncope. She has not had any noted specific weakness, slurred speech, drooping face.   She has had some memory loss issues since her stroke last year.   She denies any chest pain or palpitations. She has had no fever/chills. She has had urinary frequency and urgency. No dysuria.  She denies any headache, vision changes. Tells me she has been weak on the left side since her last stroke.     ED Course: vitals: afebrile, bp: 215/94, HR: 72 and RR: 18. Oxygen 100% on room air. Lab work with baseline creatinine 1.43, elevated alkaline phos, urine looks like UTI, CTH negative and MRI with acute infarction.   Review of Systems: As per HPI; otherwise review of systems reviewed and negative. Labs show UTI and CTH negative. Brain MRI shows acute infarct in the juxtacortical right frontal lobe. Possible subacute infarcts as well. Neurology consulted. [facility] admit [facility].   Ambulatory Status:   Ambulates with walker at home.   COVID Vaccine Status:  pfizer x 2   Past Medical History:  Diagnosis Date   Diabetes mellitus without complication (HCC)    High cholesterol    History of stroke 2021   Hypertension     History reviewed. No pertinent surgical history.  Social History   Socioeconomic History   Marital status: Single    Spouse name: Not on file   Number of children: Not on file   Years of education: Not on file   Highest education level: Not on file  Occupational History   Not on file  Tobacco Use   Smoking status: Never   Smokeless tobacco: Never  Vaping Use   Vaping Use: Never used  Substance and Sexual Activity   Alcohol use: No   Drug use: No   Sexual activity: Never  Other Topics Concern   Not on file  Social History Narrative   Tobacco use, amount per day now: None.   Past tobacco use, amount per day: None.   How many years did you use tobacco: N/A   Alcohol use (drinks per week): None.   Diet:   Do you drink/eat things with caffeine: Coffee.   Marital status: Widowed                                 What year were  you married? 1961   Do you live in a house, apartment, assisted living, condo, trailer, etc.? House.   Is it one or more stories? 2   How many persons live in your home? 2   Do you have pets in your home?( please list) Dogs ( 2 )   Highest Level of education completed? 11th Grade.   Current or past profession: [employer].   Do you exercise? No.                                 Type and how often?   Do you have a living will? No   Do you have a DNR form?   No                                If not, do you want to discuss one?   Do you have signed POA/HPOA forms?  No                    If so, please bring to you appointment      Do you have any difficulty bathing or dressing yourself? Yes   Do you have any difficulty preparing food or eating? Yes   Do you have any difficulty managing your medications? Yes   Do you have any difficulty  managing your finances? Yes   Do you have any difficulty affording your medications? Yes   Social Determinants of Health   Financial Resource Strain: Not on file  Food Insecurity: No Food Insecurity   Worried About Programme researcher, broadcasting/film/video in the Last Year: Never true   Ran Out of Food in the Last Year: Never true  Transportation Needs: No Transportation Needs   Lack of Transportation (Medical): No   Lack of Transportation (Non-Medical): No  Physical Activity: Not on file  Stress: Not on file  Social Connections: Not on file  Intimate Partner Violence: Not on file    Allergies  Allergen Reactions   Iohexol      Desc: Shaking and sore throat, Onset Date: [id number]    Iohexol Other (See Comments)    Reaction not recalled    Family History  Problem Relation Age of Onset   Stroke Mother    Lung cancer Father    Cancer Daughter    Hypertension Daughter    High Cholesterol Daughter     Prior to Admission medications   Medication Sig Start Date End Date Taking? Authorizing Provider  acetaminophen (TYLENOL) 500 MG tablet Take 500 mg by mouth every 6 (six) hours as needed for mild pain, fever or headache.   Yes [provider]  Ascorbic Acid (VITAMIN C PO) Take 1 tablet by mouth daily.   Yes [provider]  atorvastatin (LIPITOR) 40 MG tablet Take 40 mg by mouth daily.   Yes [provider]  fluticasone (FLONASE) 50 MCG/ACT nasal spray Place 1-2 sprays into both nostrils daily as needed for allergies or rhinitis.   Yes [provider]  losartan (COZAAR) 100 MG tablet Take 1 tablet (100 mg total) by mouth daily. 07/24/20  Yes Ngetich, Dinah C, NP  metFORMIN (GLUCOPHAGE) 500 MG tablet Take 1 tablet by mouth once daily Patient taking differently: Take 500 mg by mouth daily. 09/20/20  Yes Ngetich, Dinah C, NP  metoprolol tartrate (LOPRESSOR) 25 MG tablet Take 1  tablet by mouth twice daily Patient taking differently: Take 25 mg by mouth 2 (two) times  daily. 10/14/20  Yes Sharee Holster, NP  Multiple Vitamins-Minerals (ZINC PO) Take 1 tablet by mouth daily.   Yes [provider]  sertraline (ZOLOFT) 25 MG tablet TAKE 1 TABLET BY MOUTH AT BEDTIME Patient taking differently: Take 25 mg by mouth daily. 07/29/20  Yes Ngetich, Dinah C, NP  Melatonin 10 MG TABS Take 10 mg by mouth at bedtime as needed. Patient not taking: Reported on 11/16/2020 10/11/20   Gillis Santa, NP    Physical Exam: Vitals:   11/16/20 1715 11/16/20 1730 11/16/20 1745 11/16/20 1800  BP:    (!) 220/174  Pulse: (!) 59 77 62 84  Resp:    16  Temp:      TempSrc:      SpO2: 100% (!) 89% 100% 96%  Weight:         General:  Appears calm and comfortable and is in NAD Eyes:  PERRL, EOMI, normal lids, iris ENT:  grossly normal hearing, lips & tongue, mmm; appropriate dentition-dentures  Neck:  no LAD, masses or thyromegaly; no carotid bruits Cardiovascular:  RRR, no m/r/g. No LE edema.  Respiratory:   CTA bilaterally with no wheezes/rales/rhonchi.  Normal respiratory effort. Abdomen:  soft, NT, ND, NABS Back:   normal alignment, no CVAT Skin:  no rash or induration seen on limited exam Musculoskeletal:  grossly normal tone BUE/BLE, good ROM, no bony abnormality Lower extremity:  No LE edema.  Limited foot exam with no ulcerations.  2+ distal pulses. Psychiatric:  grossly normal mood and affect, speech fluent and appropriate, AOx3 Neurologic:  CN 2-12 grossly intact, moves all extremities in coordinated fashion, sensation intact. Heel to toe intact bilaterally, but slow.  Finger to nose intact bilaterally. Aphasic speech. No dysarthria. Strength: 5/5 on both upper and lower right extremity. Left upper extremity 5/5 and lower extremity about 4/5.     Radiological Exams on Admission: Independently reviewed - see discussion in A/P where applicable  CT Head Wo Contrast  Result Date: 11/16/2020 CLINICAL DATA:  Fall, altered mental status, confusion,  headache EXAM: CT HEAD WITHOUT CONTRAST TECHNIQUE: Contiguous axial images were obtained from the base of the skull through the vertex without intravenous contrast. COMPARISON:  01/17/2020 FINDINGS: Brain: No evidence of acute infarction, hemorrhage, hydrocephalus, extra-axial collection or mass lesion/mass effect. Extensive periventricular and deep white matter hypodensity. Encephalomalacia of the right caudate and adjacent corona radiata (series 2, image 15). Vascular: No hyperdense vessel or unexpected calcification. Skull: Normal. Negative for fracture or focal lesion. Sinuses/Orbits: No acute finding. Other: None. IMPRESSION: 1. No acute intracranial pathology. 2. Advanced small-vessel white matter disease. Encephalomalacia of the right caudate and adjacent corona radiata, in keeping with prior infarction. Electronically Signed   By: Lauralyn Primes M.D.   On: 11/16/2020 14:23   MR BRAIN W WO CONTRAST  Result Date: 11/16/2020 EXAM: MRI HEAD WITHOUT AND WITH CONTRAST TECHNIQUE: Multiplanar, multiecho pulse sequences of the brain and surrounding structures were obtained without and with intravenous contrast. CONTRAST:  21mL GADAVIST GADOBUTROL 1 MMOL/ML IV SOLN COMPARISON:  Same day CT head and MRI from 01/17/2020. FINDINGS: Motion limited evaluation.  Within this limitation: Brain: Restricted diffusion in the juxtacortical right frontal lobe without enhancement, compatible with acute infarct. Additional milder DWI hyperintensity with milder restricted diffusion in the left aspect of the splenium of the corpus callosum and left occipital lobe. Evolution of the previously seen right  basal ganglia and corona radiata infarcts with encephalomalacia in these areas. There is extensive patchy and confluent additional T2/FLAIR hyperintensities within the supratentorial and pontine white matter, most likely related to chronic microvascular ischemic disease. Small remote infarct in the left frontal white matter. No  hydrocephalus. Wallerian degeneration of the right midbrain. No evidence of acute hemorrhage. No extra-axial fluid collection. No abnormal enhancement. 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 The other major arterial flow voids are maintained at the skull base. Skull and upper cervical spine: Normal marrow signal. Sinuses/Orbits: Visualized sinuses are clear. No acute orbital findings. Other: Small left mastoid effusion. IMPRESSION: 1. Acute infarct in the juxtacortical right frontal lobe. Additional areas of milder restricted diffusion in the left aspect of the splenium of the corpus callosum and the left occipital lobe may represent subacute infarcts. 2. Evolution of the previously seen right basal ganglia and corona radiata infarcts with encephalomalacia in these areas. The 3. Severe chronic microvascular ischemic disease. 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 Electronically Signed   By: Feliberto Harts MD   On: 11/16/2020 16:32    EKG: pending   Labs on Admission: I have personally reviewed the available labs and imaging studies at the time of the admission.  Pertinent labs:  Creatinine: 1.43 (baseline: 1.23-1.44) AP: 158 UA: suspicious for UTI    Assessment/Plan Principal Problem:   Acute CVA (cerebrovascular accident) (HCC) -MRI brain with acute infarct and aphasic on exam at times.   -neurology consulted and will admit to [facility] -per neurology CTA head/neck but due to allergy to dye will do MRA head/neck without contrast.  -frequent neuro checks per stroke unit -echo ordered as well as ST/PT/OT-->daughter would like her placed in SNF as she doesn't feel like she is safe at home alone while she is at work.  -echo ordered. Lipid and a1c pending - SBP goal - permissive hypertension first 24 h < 220/110. Repeat bp while in room was  150/100 -NPO until bed side swallow screen  -ASA 81mg  +loading dose of plavix 300mg  x1 today per neurology.    Active Problems:    Urinary tract infection -continue rocephin, follow up on culture  Aphasia/acute metabolic encephalopathy -likely secondary to stroke -UTI could be contributing. Treating with rocephin. F/u on culture.  -check TSH/ammonia/b12.     Essential hypertension -blood pressures have been elevated here, but allow permissive HTN first 24 hours. <220/110. Hold home meds until tomorrow, but appears was given her home doses earlier today in ER.  -hydralazine prn for >220/110    CKD (chronic kidney disease), stage III (HCC) -at her baseline -continue to monitor.     Hyperlipidemia LDL goal <100 LDL 101 06/2020 Lipid panel pending. Goal <70 Currently on lipitor 40mg     Type 2 diabetes mellitus without complication, without long-term current use of insulin (HCC)  -hold her home metformin. Would consider d/c with her creatinine -SSI/accuchecks/pending a1c -last a1c in 06/2020 was 6.3   Body mass index is 26.56 kg/m.   Level of care: Telemetry Medical DVT prophylaxis:  Lovenox  Code Status: DNR confirmed with daughter.  Family Communication: None present; I spoke with the patient's daughter Heather [name] by phone: [phone] Disposition Plan:  The patient is from: home  Anticipated d/c is to: SNF (daughter would like her to go to a SNF as she doesn't feel  like she is safe at home alone) Consults called: neurology by EDP physician and myself.   Admission status:  observation    Orland Mustard MD [facility]   How to contact the [facility] Attending or Consulting provider 7A - 7P or covering provider during after hours 7P -7A, for this patient?  Check the care team in [facility] and look for a) attending/consulting [facility] provider listed and b) the [facility] team listed Log into [email or web address] and use [place]'s universal password to access. If you do not have the  password, please contact the hospital operator. Locate the [facility] provider you are looking for under [facility] and page to a number that you can be directly reached. If you still have difficulty reaching the provider, please page the [facility] (Director on Call) for the Hospitalists listed on amion for assistance.   11/16/2020, 7:00 PM

## 2020-11-17 ENCOUNTER — Observation Stay (HOSPITAL_COMMUNITY): Payer: Medicare HMO

## 2020-11-17 DIAGNOSIS — F0390 Unspecified dementia without behavioral disturbance: Secondary | ICD-10-CM | POA: Diagnosis present

## 2020-11-17 DIAGNOSIS — E1122 Type 2 diabetes mellitus with diabetic chronic kidney disease: Secondary | ICD-10-CM | POA: Diagnosis present

## 2020-11-17 DIAGNOSIS — I6389 Other cerebral infarction: Secondary | ICD-10-CM

## 2020-11-17 DIAGNOSIS — N3001 Acute cystitis with hematuria: Secondary | ICD-10-CM | POA: Diagnosis present

## 2020-11-17 DIAGNOSIS — I639 Cerebral infarction, unspecified: Secondary | ICD-10-CM

## 2020-11-17 DIAGNOSIS — I10 Essential (primary) hypertension: Secondary | ICD-10-CM

## 2020-11-17 DIAGNOSIS — Z7984 Long term (current) use of oral hypoglycemic drugs: Secondary | ICD-10-CM | POA: Diagnosis not present

## 2020-11-17 DIAGNOSIS — Z8249 Family history of ischemic heart disease and other diseases of the circulatory system: Secondary | ICD-10-CM | POA: Diagnosis not present

## 2020-11-17 DIAGNOSIS — I651 Occlusion and stenosis of basilar artery: Secondary | ICD-10-CM | POA: Diagnosis present

## 2020-11-17 DIAGNOSIS — E119 Type 2 diabetes mellitus without complications: Secondary | ICD-10-CM | POA: Diagnosis not present

## 2020-11-17 DIAGNOSIS — Z20822 Contact with and (suspected) exposure to covid-19: Secondary | ICD-10-CM | POA: Diagnosis present

## 2020-11-17 DIAGNOSIS — Z79899 Other long term (current) drug therapy: Secondary | ICD-10-CM | POA: Diagnosis not present

## 2020-11-17 DIAGNOSIS — B9620 Unspecified Escherichia coli [E. coli] as the cause of diseases classified elsewhere: Secondary | ICD-10-CM | POA: Diagnosis present

## 2020-11-17 DIAGNOSIS — Z823 Family history of stroke: Secondary | ICD-10-CM | POA: Diagnosis not present

## 2020-11-17 DIAGNOSIS — E785 Hyperlipidemia, unspecified: Secondary | ICD-10-CM | POA: Diagnosis not present

## 2020-11-17 DIAGNOSIS — Z66 Do not resuscitate: Secondary | ICD-10-CM | POA: Diagnosis present

## 2020-11-17 DIAGNOSIS — W010XXA Fall on same level from slipping, tripping and stumbling without subsequent striking against object, initial encounter: Secondary | ICD-10-CM | POA: Diagnosis present

## 2020-11-17 DIAGNOSIS — R29701 NIHSS score 1: Secondary | ICD-10-CM | POA: Diagnosis present

## 2020-11-17 DIAGNOSIS — R4182 Altered mental status, unspecified: Secondary | ICD-10-CM | POA: Diagnosis not present

## 2020-11-17 DIAGNOSIS — F32A Depression, unspecified: Secondary | ICD-10-CM | POA: Diagnosis present

## 2020-11-17 DIAGNOSIS — N179 Acute kidney failure, unspecified: Secondary | ICD-10-CM | POA: Diagnosis present

## 2020-11-17 DIAGNOSIS — N390 Urinary tract infection, site not specified: Secondary | ICD-10-CM | POA: Diagnosis not present

## 2020-11-17 DIAGNOSIS — E7800 Pure hypercholesterolemia, unspecified: Secondary | ICD-10-CM | POA: Diagnosis present

## 2020-11-17 DIAGNOSIS — N1832 Chronic kidney disease, stage 3b: Secondary | ICD-10-CM | POA: Diagnosis present

## 2020-11-17 DIAGNOSIS — Z8616 Personal history of COVID-19: Secondary | ICD-10-CM | POA: Diagnosis not present

## 2020-11-17 DIAGNOSIS — I69311 Memory deficit following cerebral infarction: Secondary | ICD-10-CM | POA: Diagnosis not present

## 2020-11-17 DIAGNOSIS — Y92009 Unspecified place in unspecified non-institutional (private) residence as the place of occurrence of the external cause: Secondary | ICD-10-CM | POA: Diagnosis not present

## 2020-11-17 DIAGNOSIS — I129 Hypertensive chronic kidney disease with stage 1 through stage 4 chronic kidney disease, or unspecified chronic kidney disease: Secondary | ICD-10-CM | POA: Diagnosis present

## 2020-11-17 DIAGNOSIS — E876 Hypokalemia: Secondary | ICD-10-CM | POA: Diagnosis not present

## 2020-11-17 DIAGNOSIS — Z801 Family history of malignant neoplasm of trachea, bronchus and lung: Secondary | ICD-10-CM | POA: Diagnosis not present

## 2020-11-17 DIAGNOSIS — G9341 Metabolic encephalopathy: Secondary | ICD-10-CM | POA: Diagnosis present

## 2020-11-17 LAB — ECHOCARDIOGRAM COMPLETE
AR max vel: 2.86 cm2
AV Area VTI: 3.26 cm2
AV Area mean vel: 2.87 cm2
AV Mean grad: 3 mmHg
AV Peak grad: 4.5 mmHg
Ao pk vel: 1.06 m/s
Area-P 1/2: 2.55 cm2
Calc EF: 61.7 %
S' Lateral: 2.1 cm
Single Plane A2C EF: 48.4 %
Single Plane A4C EF: 67.2 %
Weight: 2176 oz

## 2020-11-17 LAB — LIPID PANEL
Cholesterol: 227 mg/dL — ABNORMAL HIGH (ref 0–200)
HDL: 36 mg/dL — ABNORMAL LOW (ref >40)
LDL Cholesterol: 167 mg/dL — ABNORMAL HIGH (ref 0–99)
Total CHOL/HDL Ratio: 6.3 RATIO
Triglycerides: 120 mg/dL (ref <150)
VLDL: 24 mg/dL (ref 0–40)

## 2020-11-17 LAB — BASIC METABOLIC PANEL
Anion gap: 9 (ref 5–15)
BUN: 18 mg/dL (ref 8–23)
CO2: 27 mmol/L (ref 22–32)
Calcium: 9.3 mg/dL (ref 8.9–10.3)
Chloride: 101 mmol/L (ref 98–111)
Creatinine, Ser: 1.41 mg/dL — ABNORMAL HIGH (ref 0.44–1.00)
GFR, Estimated: 38 mL/min — ABNORMAL LOW (ref >60)
Glucose, Bld: 141 mg/dL — ABNORMAL HIGH (ref 70–99)
Potassium: 3.5 mmol/L (ref 3.5–5.1)
Sodium: 137 mmol/L (ref 135–145)

## 2020-11-17 LAB — HEMOGLOBIN A1C
Hgb A1c MFr Bld: 6.6 % — ABNORMAL HIGH (ref 4.8–5.6)
Mean Plasma Glucose: 142.72 mg/dL

## 2020-11-17 LAB — TSH: TSH: 2.553 u[IU]/mL (ref 0.350–4.500)

## 2020-11-17 LAB — APTT: aPTT: 26 seconds (ref 24–36)

## 2020-11-17 LAB — GLUCOSE, CAPILLARY
Glucose-Capillary: 181 mg/dL — ABNORMAL HIGH (ref 70–99)
Glucose-Capillary: 108 mg/dL — ABNORMAL HIGH (ref 70–99)
Glucose-Capillary: 176 mg/dL — ABNORMAL HIGH (ref 70–99)

## 2020-11-17 LAB — VITAMIN B12: Vitamin B-12: 391 pg/mL (ref 180–914)

## 2020-11-17 LAB — AMMONIA: Ammonia: 10 umol/L (ref 9–35)

## 2020-11-17 LAB — CBG MONITORING, ED: Glucose-Capillary: 113 mg/dL — ABNORMAL HIGH (ref 70–99)

## 2020-11-17 IMAGING — MR MR MRA NECK W/O CM
3 series · 16 of 48 positions shown · non-contrast
Comparison: [DATE]

CLINICAL DATA: Stroke follow-up

EXAM:
MRA NECK WITHOUT CONTRAST
TECHNIQUE: Angiographic images of the neck were acquired using MRA technique
without intravenous contrast. Carotid stenosis measurements (when
applicable) are obtained utilizing NASCET criteria, using the distal
internal carotid diameter as the denominator.

[Series 4: tof_2d_tra · axial · 3.5mm · 0.43mm/px · z∈[-166,-39]mm · 11 of 60 slices shown]
[im 4/60]
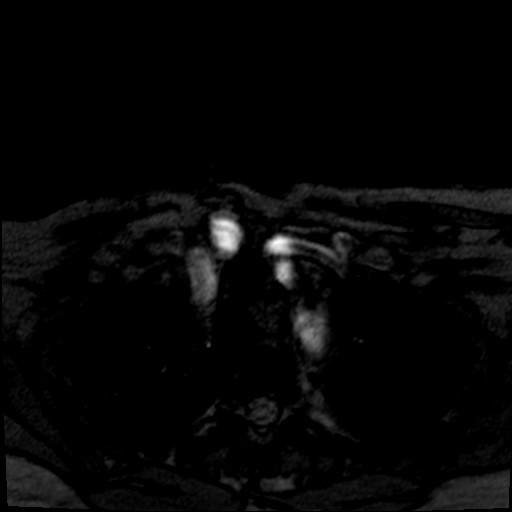
[im 8/60]
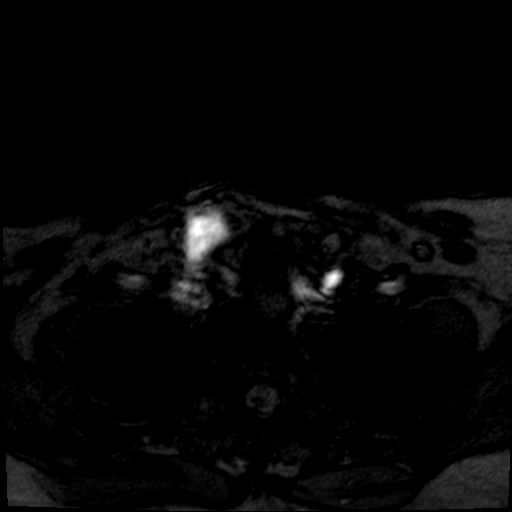
[im 12/60]
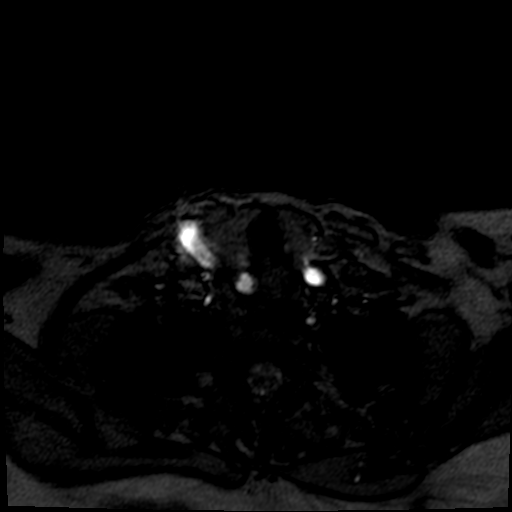
[im 19/60]
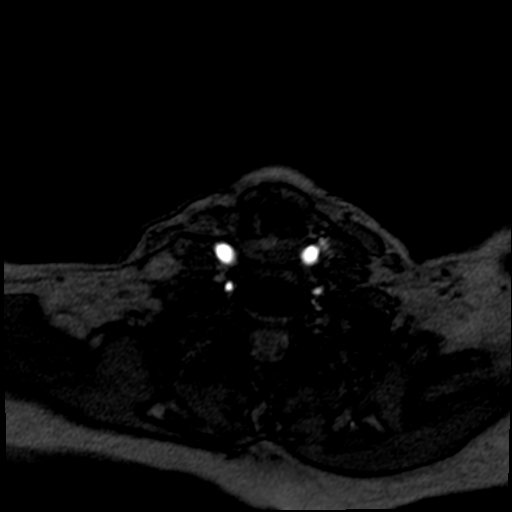
[im 26/60]
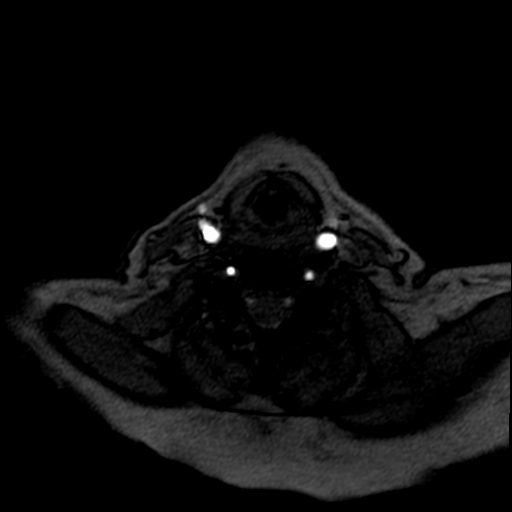
[im 30/60]
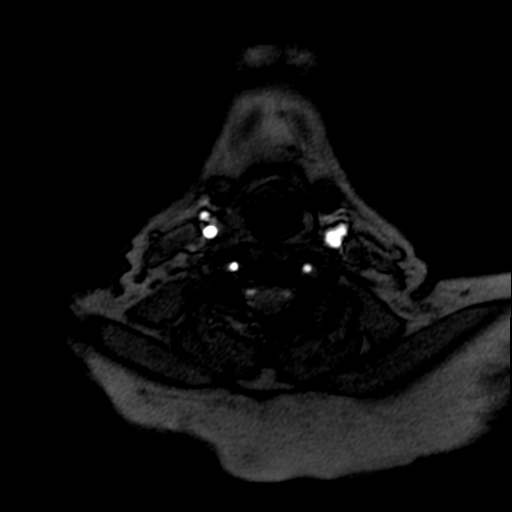
[im 34/60]
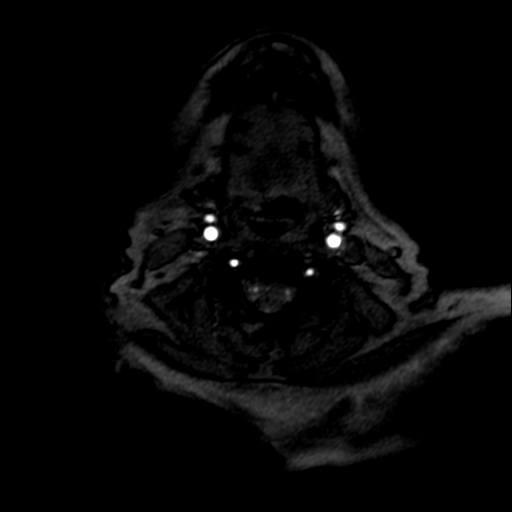
[im 41/60]
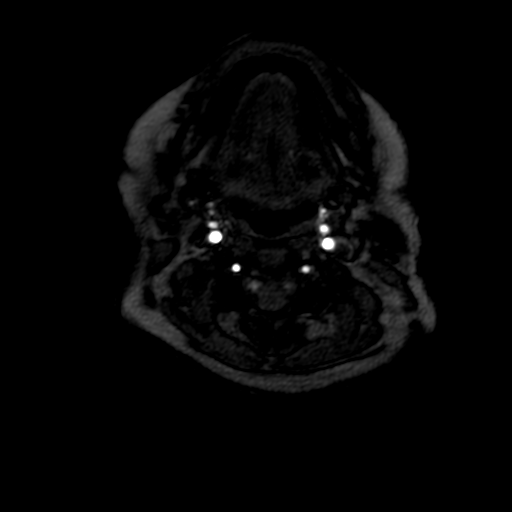
[im 48/60]
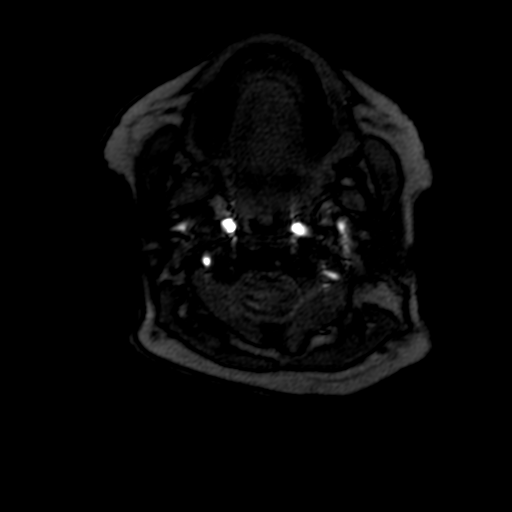
[im 52/60]
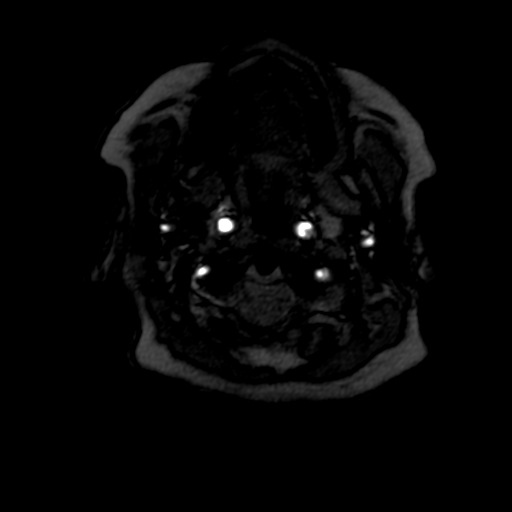
[im 56/60]
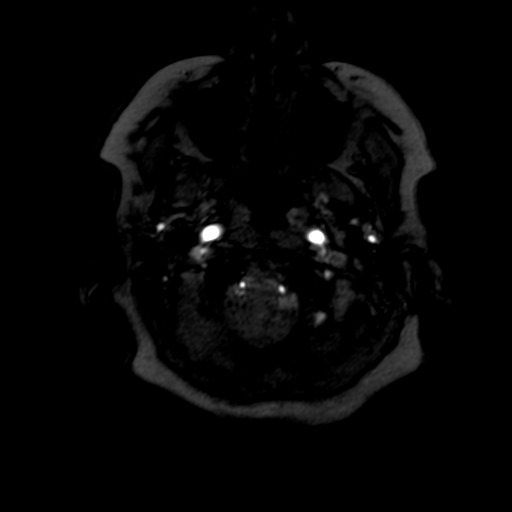

[Series 7: tof_2d_tra_mip_tra · axial · 148.0mm · 0.43mm/px · 1 of 1 slices shown]
[im 1/1]
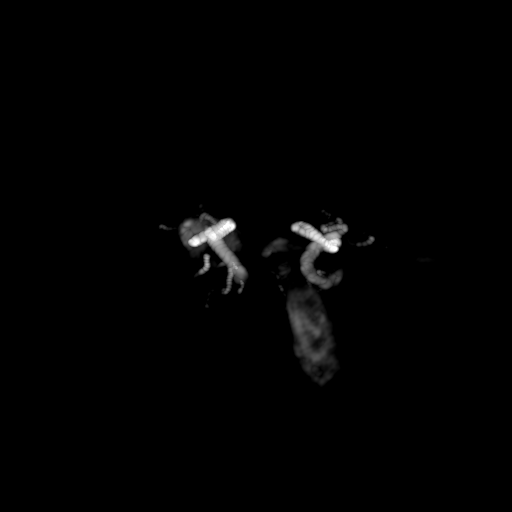

[Series 9: tof_3d_multi-slab-bifurcation · axial · 1.0mm · 0.26mm/px · z∈[-155,-70]mm · 4 of 104 slices shown]
[im 4/104]
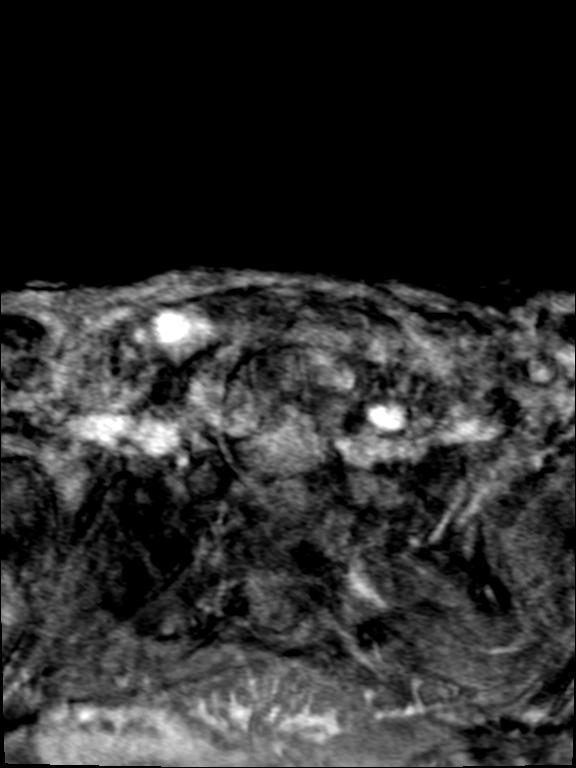
[im 18/104]
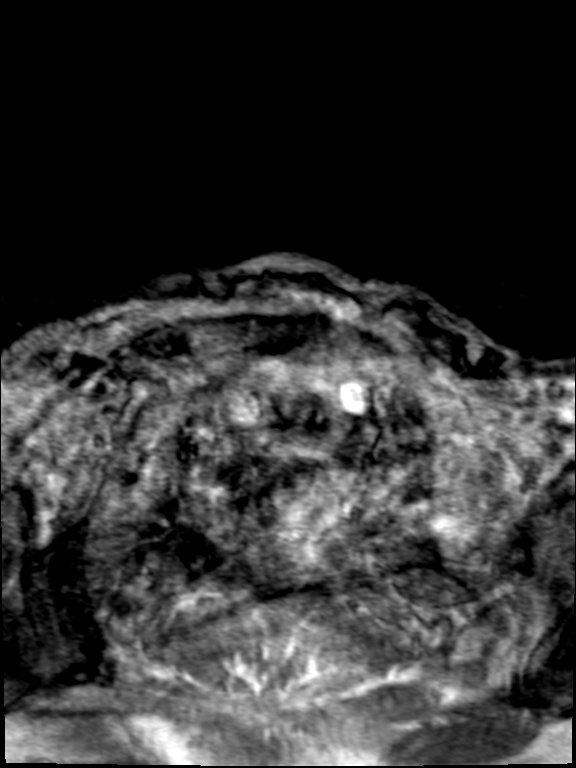
[im 54/104]
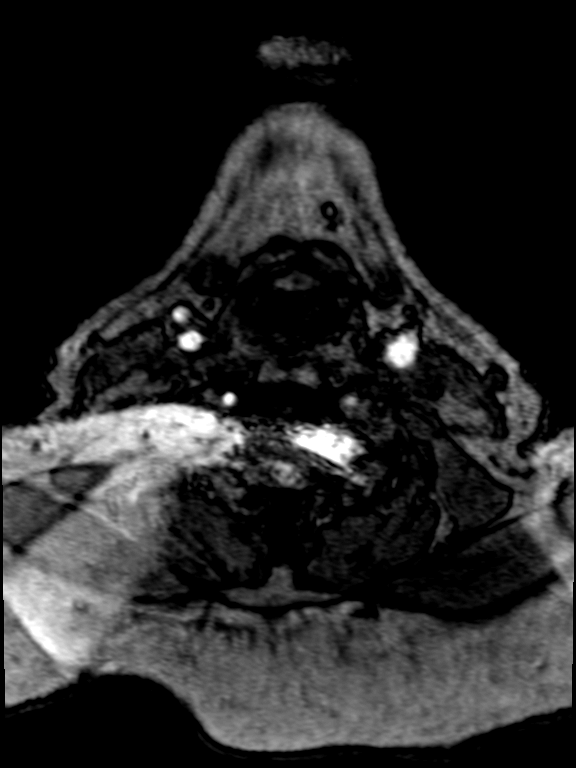
[im 89/104]
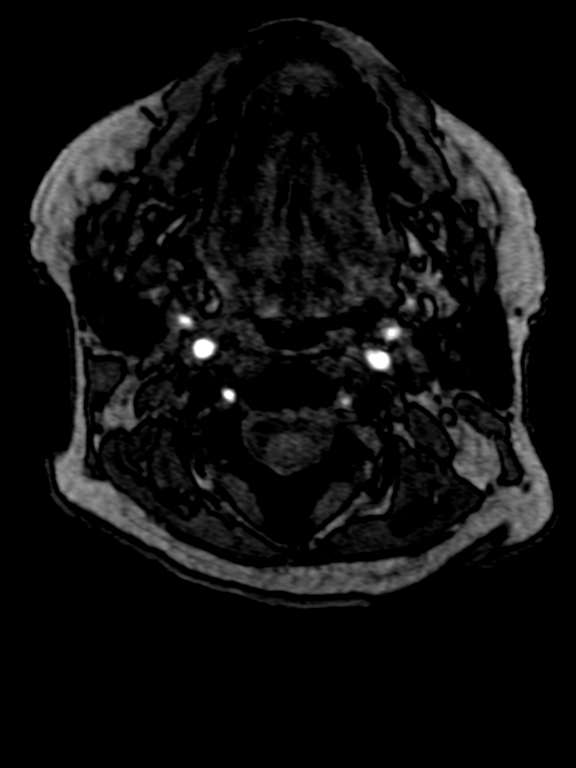

[16 of 48 positions shown; findings below may reference images not displayed]

FINDINGS: Aortic arch: Limited coverage is negative.

Right carotid system: No stenosis or beading seen.

Left carotid system: No stenosis or beading seen.

Vertebral arteries: Covered proximal subclavian arteries are patent
but significantly motion degraded evaluation. Codominant vertebral
arteries are patent to the dura. The left vertebral shows less
intense flow by 3D time-of-flight, but is still patent in the neck
and actually improved from prior
IMPRESSION: 1. No emergent finding or change from [MR].
2. Chronically diminished flow in the left vertebral artery likely
due to non resolved proximal stenosis.

## 2020-11-17 IMAGING — MR MR MRA HEAD W/O CM
2 series · 21 of 48 positions shown · non-contrast
Comparison: [DATE]

CLINICAL DATA: Stroke follow-up

EXAM:
MRA HEAD WITHOUT CONTRAST
TECHNIQUE: Angiographic images of the Circle of Willis were acquired using MRA
technique without intravenous contrast.

[Series 5: vessel_scout_head_msum · sagittal · 6.0mm · 0.59mm/px · 6 of 24 slices shown]
[im 1/24]
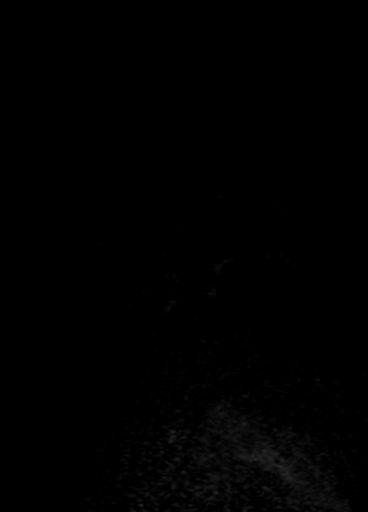
[im 5/24]
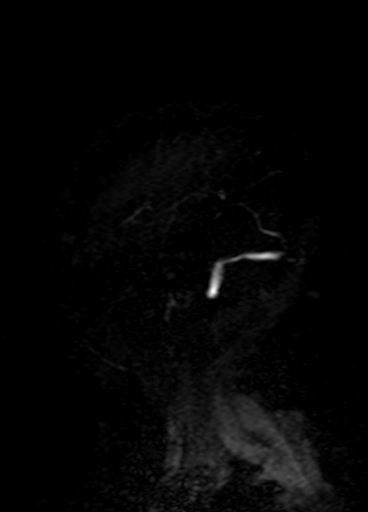
[im 10/24]
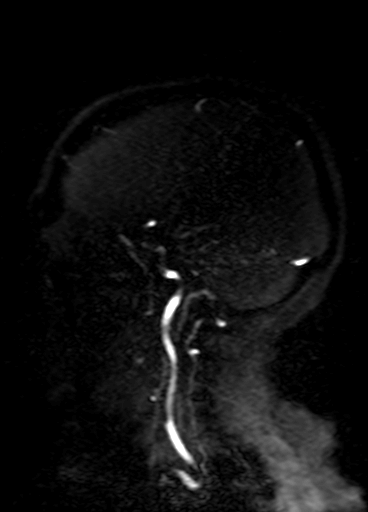
[im 14/24]
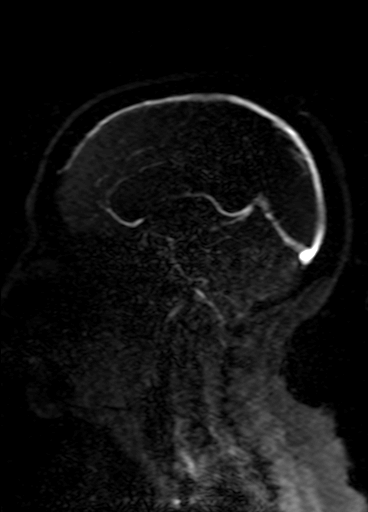
[im 19/24]
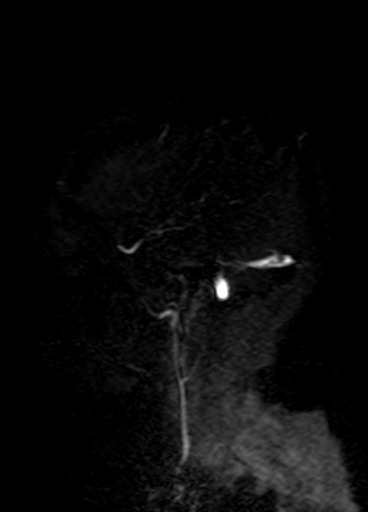
[im 24/24]
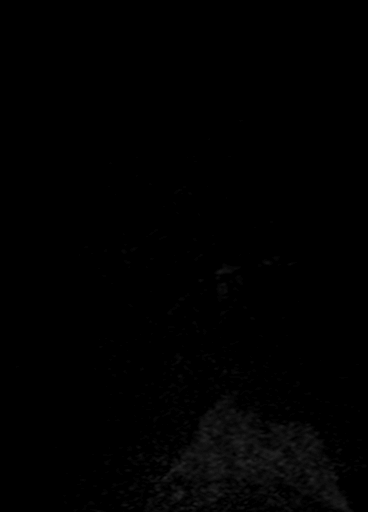

[Series 8: TOF · axial · 0.6mm · 0.35mm/px · z∈[-75,+22]mm · 15 of 172 slices shown]
[im 1/172]
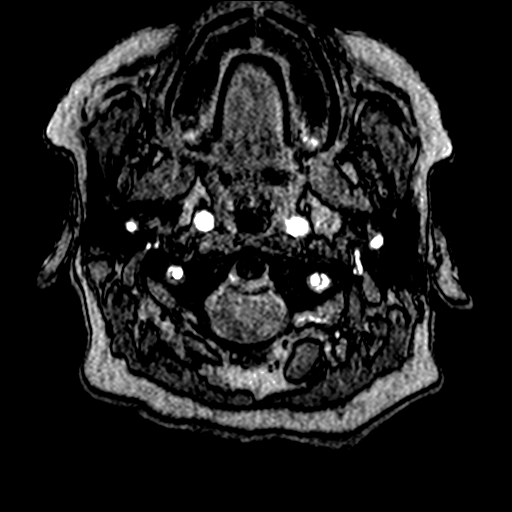
[im 5/172]
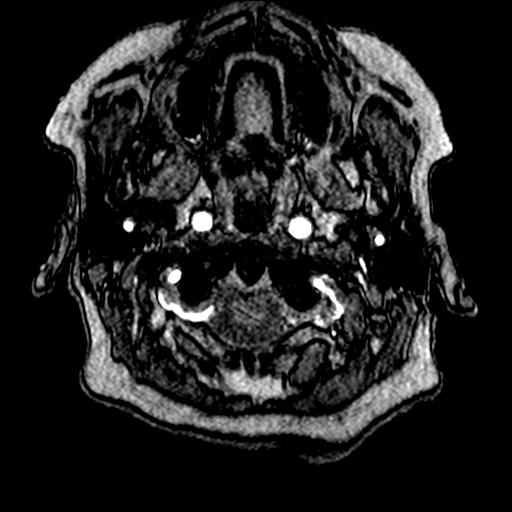
[im 9/172]
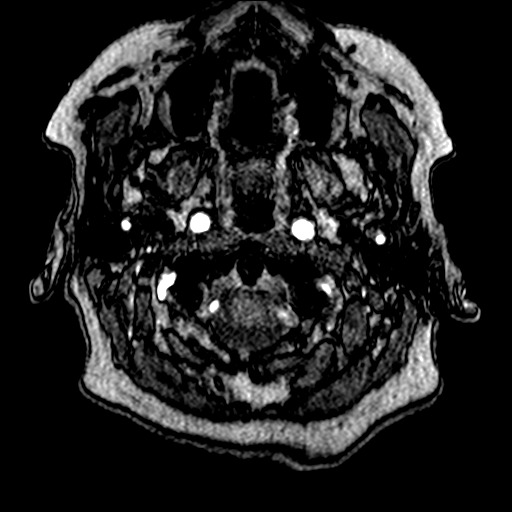
[im 13/172]
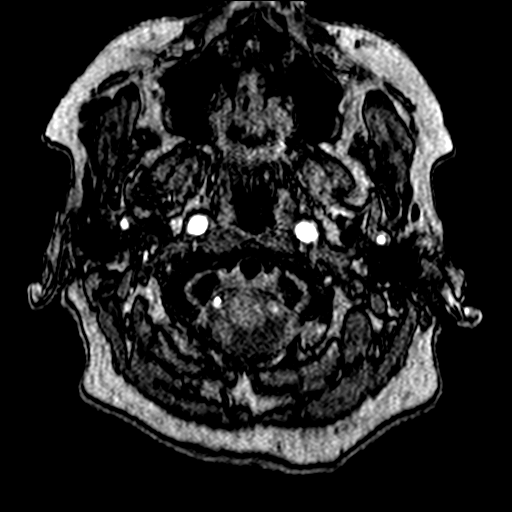
[im 17/172]
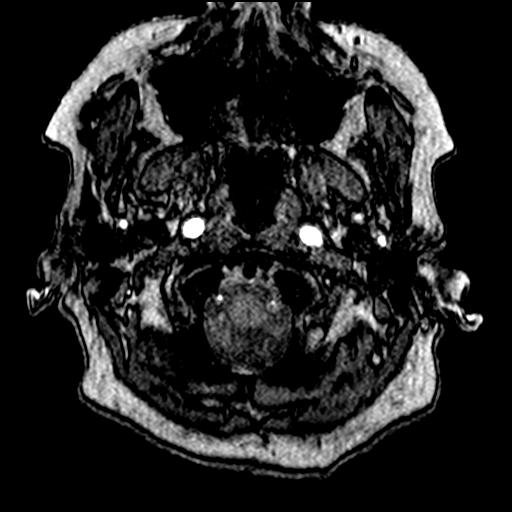
[im 21/172]
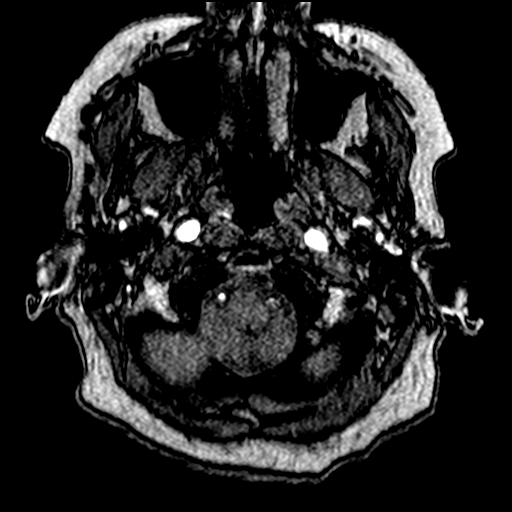
[im 30/172]
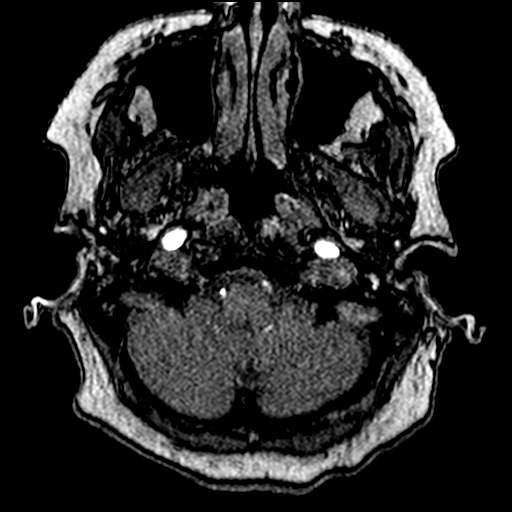
[im 55/172]
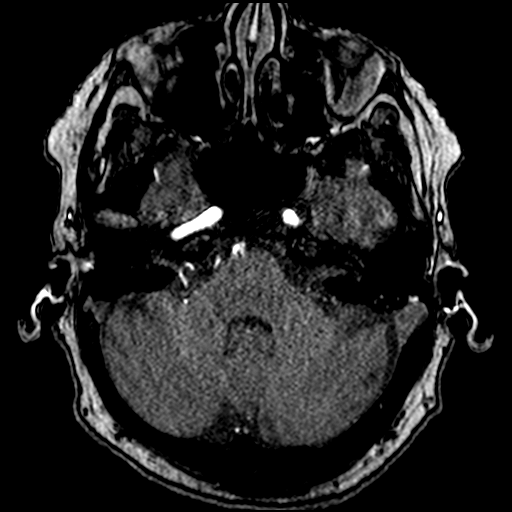
[im 76/172]
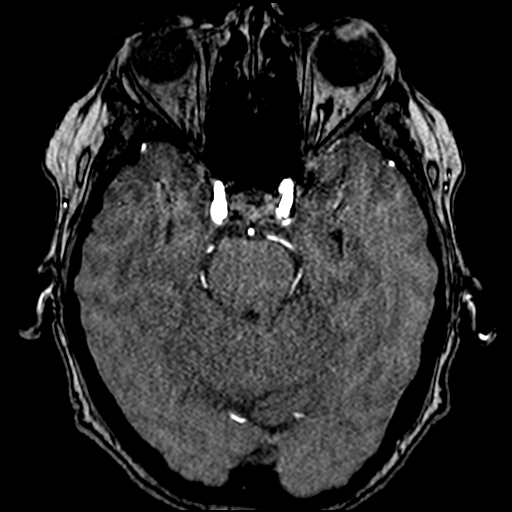
[im 88/172]
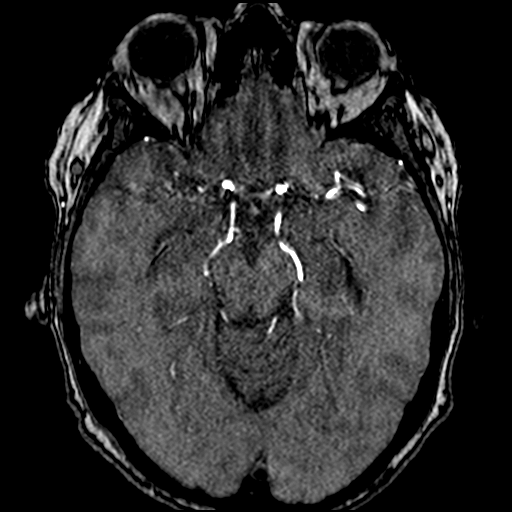
[im 96/172]
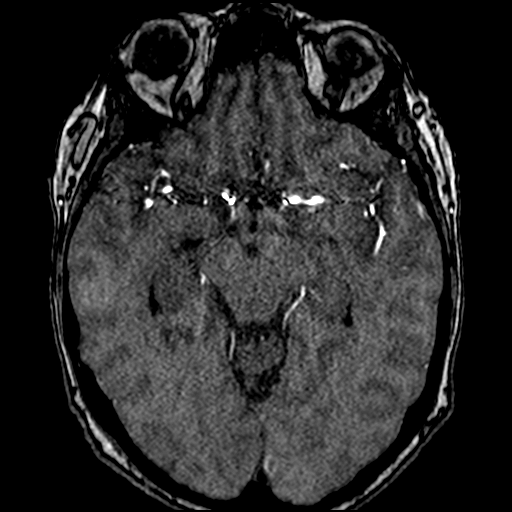
[im 117/172]
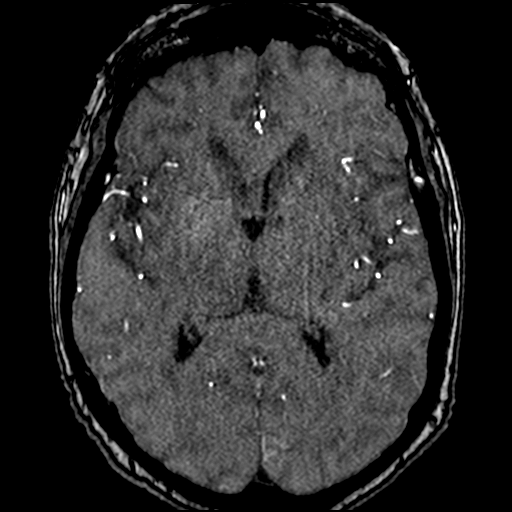
[im 142/172]
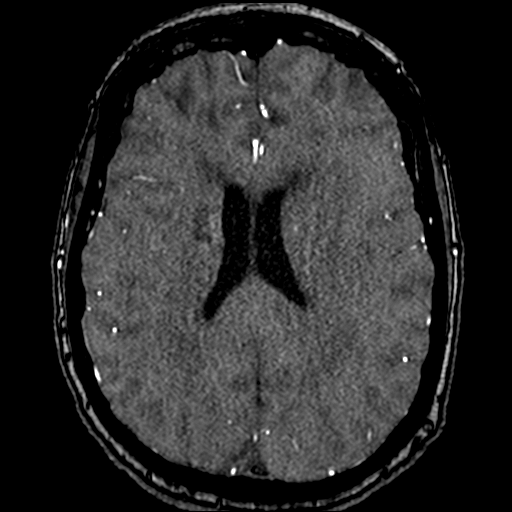
[im 146/172]
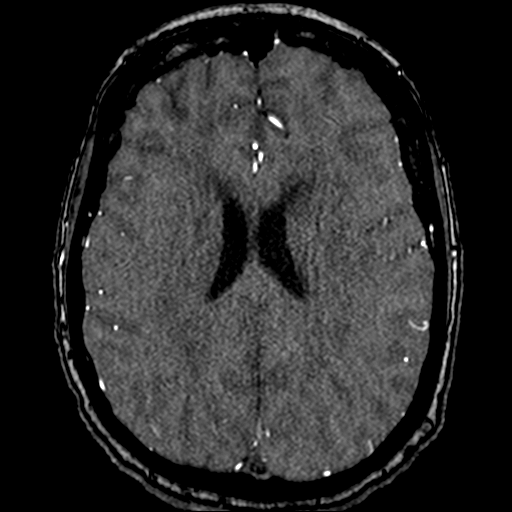
[im 163/172]
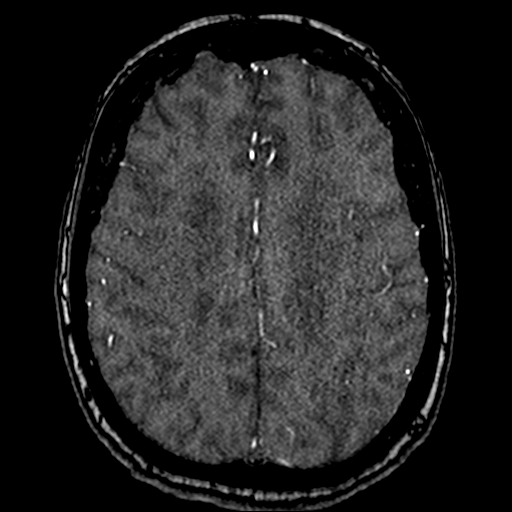

[21 of 48 positions shown; findings below may reference images not displayed]

FINDINGS: Anterior circulation: The carotid siphons are diffusely patent.
Aplastic or hypoplastic left A1 segment. Atheromatous irregularity
of bilateral MCA branches which is prominent. Chronic high-grade
apearing left ICA terminus stenosis. Moderate to advanced left M2
branch stenosis.

Posterior circulation: Chronic occlusion of the left vertebral
artery with faint distal V4 segment flow which could be retrograde.
There is multifocal atheromatous irregularity of the right vertebral
and basilar artery with tandem high-grade basilar stenosis. Proximal
basilar narrowing has progressed. There is a small basilar in the
setting of fetal type bilateral PCA flow. Moderate bilateral PCA
atheromatous irregularity and narrowing which appears progressed

Anatomic variants: As above
IMPRESSION: Advanced intracranial atherosclerosis with multifocal high-grade
narrowing that is progressed from [4O].

## 2020-11-17 MED ORDER — CLOPIDOGREL BISULFATE 75 MG PO TABS
75.0000 mg | ORAL_TABLET | Freq: Every day | ORAL | Status: DC
  Administered 2020-11-17 – 2020-11-26 (×10): 75 mg via ORAL
  Filled 2020-11-17 (×11): qty 1

## 2020-11-17 MED ORDER — HYDRALAZINE HCL 25 MG PO TABS
25.0000 mg | ORAL_TABLET | Freq: Four times a day (QID) | ORAL | Status: DC | PRN
  Filled 2020-11-17: qty 1

## 2020-11-17 MED ORDER — ATORVASTATIN CALCIUM 80 MG PO TABS: 80.0000 mg | ORAL_TABLET | Freq: Every day | ORAL | Status: DC

## 2020-11-17 MED ORDER — LABETALOL HCL 5 MG/ML IV SOLN
10.0000 mg | INTRAVENOUS | Status: DC | PRN
  Administered 2020-11-18: 10 mg via INTRAVENOUS
  Filled 2020-11-17: qty 4

## 2020-11-17 MED ORDER — ONDANSETRON HCL 4 MG/2ML IJ SOLN
4.0000 mg | Freq: Four times a day (QID) | INTRAMUSCULAR | Status: DC | PRN
  Administered 2020-11-19: 4 mg via INTRAVENOUS
  Filled 2020-11-17: qty 2

## 2020-11-17 MED ORDER — ATORVASTATIN CALCIUM 80 MG PO TABS
80.0000 mg | ORAL_TABLET | Freq: Every day | ORAL | Status: DC
  Administered 2020-11-17: 80 mg via ORAL
  Filled 2020-11-17: qty 1

## 2020-11-17 MED ORDER — CYANOCOBALAMIN 1000 MCG/ML IJ SOLN
1000.0000 ug | Freq: Every day | INTRAMUSCULAR | Status: AC
  Administered 2020-11-17 – 2020-11-19 (×3): 1000 ug via INTRAMUSCULAR
  Filled 2020-11-17 (×4): qty 1

## 2020-11-17 NOTE — Progress Notes (Signed)
PROGRESS NOTE    Heather [name]  HRC:[id number] DOB: [birth year] DOA: 11/16/2020 PCP: Caesar Bookman, NP    Brief Narrative:  Heather [name] is a 80 year old female with past medical history significant for essential pretension, hyperlipidemia, history of ischemic CVA 2021, DM2 who presented initially to [facility] ED on 7/9 after fall and speech/cognitive issues over the last 3 weeks.  Daughter who is present at bedside contributed to the HPI.  Daughter states her mother has not been acting right after being diagnosed with COVID-19 viral infection 3 weeks prior.  She would say things that did not make sense; and apparently had a shirt buttons around her leg last night.  Daughter found patient in her closet, on the floor with her short half off and her pants were down around her knees, unclear if syncopal episode.  Did not notice any weakness or facial droop.  Daughter reports she has had some memory loss issues since her ischemic stroke last year.  Patient had reported increased urinary urgency/frequency but no dysuria.  Patient denied headache, no visual changes; but also noted that she has been weak on her left side since her previous ischemic stroke.  In the ED, temperature 98.0 F, HR 70, RR 18, BP 215/94, SPO2 98% on room air.  Sodium 138, potassium 3.7, chloride 100, CO2 29, BUN 16, creatinine 1.43, AST 43, ALT 36, glucose 149.  WBC 7.4, hemoglobin 15.2, platelets 244.  High-sensitivity troponin 13 > 13.  EKG with NSR, rate 59, QTc 440, right bundle branch block, T wave inversion isolated V3, similar in appearance to EKG dated 01/18/2020.  COVID-19/SARS-CoV-2 negative.  Urinalysis with large leukocytes, negative nitrite, many bacteria, greater than 50 WBCs.  CT head without contrast with no acute intracranial pathology, notable advanced small vessel white matter disease with encephalomalacia of the right caudate and adjacent corona radiata.  MR brain with acute infarct juxtacortical right frontal lobe; with  possible subacute infarcts as well.  Neurology was consulted.  TRH consulted for further evaluation and management of acute CVA and urinary tract infection.  Patient transferred from [place] to [facility] for stroke team evaluation.   Assessment & Plan:   Principal Problem:   Acute CVA (cerebrovascular accident) (HCC) Active Problems:   Essential hypertension   Hyperlipidemia LDL goal <100   Type 2 diabetes mellitus without complication, without long-term current use of insulin (HCC)   Urinary tract infection   CKD (chronic kidney disease), stage III (HCC)   Acute metabolic encephalopathy, POA Family reports patient has been acting more confused over the last 3 weeks, after recent diagnosis of COVID-19 viral infection.  Also reported patient with memory issues since previous ischemic CVA 2021.  Patient was found down at home with reports of her shirt buttoned onto her leg.  Ammonia level within normal limits, patient afebrile without leukocytosis.  TSH 2.553, within normal limits.  B12 slightly low at 391.  Head imaging with acute and subacute infarcts as well as urinalysis suggestive of a urinary tract infection. --Continue treatment as below --Supportive care  Acute ischemic CVA Patient presenting to [facility] ED after being found down at home by family.  Per family report, patient has not been acting right with worsening confusion over the last 3 weeks; following recent diagnosis of COVID-19 viral infection.  CT head without contrast with no acute intracranial findings but notable for advanced small vessel white matter disease and encephalomalacia of the right caudate/corona radiata consistent with prior CVA.  MR brain with acute infarct just a cortical right frontal lobe with subacute infarct left corpus callosum/occipital lobe.  MRA head/neck with advanced intracranial atherosclerosis with multifocal high-grade narrowing which is progressed from 2021 and chronically diminished flow left  vertebral artery due to none resolved proximal stenosis.  Hemoglobin A1c 6.6, LDL 167.  TTE with LVEF 60-75% grade 1 diastolic dysfunction. --Neurology following, appreciate assistance --Allow permissive hypertension over the next 24 hours, then will slowly titrate antihypertensives for normalization of blood pressure --Continue aspirin 81 mg p.o. daily --Plavix 75 mg p.o. daily --Increase atorvastatin to 80 mg p.o. daily --Pending PT/OT/SLP evaluation --Continue monitor from telemetry --Anticipate need for SNF placement, TOC evaluation  Urinary tract infection Patient reported increased urinary frequency/urgency without dysuria.  Urinalysis with large leukoesterase, negative nitrite, many bacteria, greater than 50 WBCs.  Patient is afebrile without leukocytosis.  This could also be a contributing factor to her worsening confusion over the last 3 weeks. --Urine culture: Pending --Continue ceftriaxone gram IV every 24 hours  Elevated AST AST slightly available at 43 on admission.  Unclear etiology, possible mild acute liver injury from unknown downtime at home.  Given need for increased statin dose for CVA, will trend LFTs. --Repeat CMP in a.m.  Essential hypertension Home regimen includes losartan 100 mg p.o. daily, metoprolol tartrate 25 mg p.o. twice daily. --Allow permissive hypertension for the next 24 hours given acute CVA as above; then will slowly restart antihypertensive regimen for slow normalization of blood pressure --Hydralazine 25 mg p.o. q6h prn SBP >220 or DBP >110   Hyperlipidemia Lipid panel with total cholesterol 227, HDL 36, LDL 167.  Goal HDL less than 70 with acute CVA. --Atorvastatin increased from 40 mg to 80 mg p.o. daily  Type 2 diabetes mellitus Hemoglobin A1c 6.6, well controlled.  On metformin 500 mg p.o. daily at home. --Hold oral hypoglycemics while inpatient --SSI for coverage --CBG before every meal/at bedtime  Depression --Continue sertraline 25 mg  p.o. daily   DVT prophylaxis: enoxaparin (LOVENOX) injection 30 mg Start: 11/16/20 2200   Code Status: DNR Family Communication: Updated family members present at bedside  Disposition Plan:  Level of care: Telemetry Medical Status is: Inpatient  Remains inpatient appropriate because:Altered mental status, Ongoing diagnostic testing needed not appropriate for outpatient work up, Unsafe d/c plan, IV treatments appropriate due to intensity of illness or inability to take PO, and Inpatient level of care appropriate due to severity of illness  Dispo: The patient is from: Home              Anticipated d/c is to: SNF              Patient currently is not medically stable to d/c.   Difficult to place patient No   Consultants:  Neurology  Procedures:  TTE  Antimicrobials:  Ceftriaxone 7/9>>   Subjective: Patient seen examined bedside, resting comfortably.  Feels like her speech/confusion is improving.  Continues with some left-sided weakness, although has been present since previous CVA apparently 1 year ago.  Family present at bedside.  Neurology at bedside as well.  No specific complaints or concerns at this time, other than concerned about increased dose of atorvastatin as this has given her "cramps" in the past.  Currently denies headache, no visual changes, no chest pain, no palpitations, no shortness of breath, no abdominal pain, no fever/chills/night sweats, no nausea/vomiting/diarrhea, no paresthesias.  Objective: Vitals:   11/17/20 0700 11/17/20 0745 11/17/20 0924 11/17/20 1323  BP: Marland Kitchen)  170/60  (!) 153/75 (!) 167/73  Pulse:  87 69 65  Resp:  16 15 17   Temp:    (!) 97.5 F (36.4 C)  TempSrc:    Oral  SpO2:  100% 100% 98%  Weight:        Intake/Output Summary (Last 24 hours) at 11/17/2020 1346 Last data filed at 11/16/2020 1747 Gross per 24 hour  Intake 96.81 ml  Output --  Net 96.81 ml   Filed Weights   11/16/20 1208  Weight: 61.7 kg    Examination:  General  exam: Appears calm and comfortable  Respiratory system: Clear to auscultation. Respiratory effort normal.  On room air Cardiovascular system: S1 & S2 heard, RRR. No JVD, murmurs, rubs, gallops or clicks. No pedal edema. Gastrointestinal system: Abdomen is nondistended, soft and nontender. No organomegaly or masses felt. Normal bowel sounds heard. Central nervous system: Alert and oriented.  Some difficulty with word finding and mild left lower extremity weakness otherwise no focal neurological deficits Extremities: Left lower extremity muscle strength 4+/5, otherwise strength to bilateral upper extremities and right lower extremity preserved 5/5. Skin: No rashes, lesions or ulcers Psychiatry: Judgement and insight appear slightly diminished. Mood & affect appropriate.     Data Reviewed: I have personally reviewed following labs and imaging studies  CBC: Recent Labs  Lab 11/16/20 1349  WBC 7.4  NEUTROABS 5.9  HGB 15.2*  HCT 45.9  MCV 93.7  PLT 244   Basic Metabolic Panel: Recent Labs  Lab 11/16/20 1349 11/17/20 0527  NA 138 137  K 3.7 3.5  CL 100 101  CO2 29 27  GLUCOSE 149* 141*  BUN 16 18  CREATININE 1.43* 1.41*  CALCIUM 9.7 9.3   GFR: Estimated Creatinine Clearance: 27 mL/min (A) (by C-G formula based on SCr of 1.41 mg/dL (H)). Liver Function Tests: Recent Labs  Lab 11/16/20 1349  AST 43*  ALT 36  ALKPHOS 158*  BILITOT 0.9  PROT 7.7  ALBUMIN 4.1   No results for input(s): LIPASE, AMYLASE in the last 168 hours. Recent Labs  Lab 11/17/20 0525  AMMONIA 10   Coagulation Profile: Recent Labs  Lab 11/16/20 1349  INR 0.9   Cardiac Enzymes: No results for input(s): CKTOTAL, CKMB, CKMBINDEX, TROPONINI in the last 168 hours. BNP (last 3 results) No results for input(s): PROBNP in the last 8760 hours. HbA1C: Recent Labs    11/17/20 0525  HGBA1C 6.6*   CBG: Recent Labs  Lab 11/16/20 2245 11/17/20 0919 11/17/20 1318  GLUCAP 152* 113* 181*   Lipid  Profile: Recent Labs    11/17/20 0525  CHOL 227*  HDL 36*  LDLCALC 167*  TRIG 120  CHOLHDL 6.3   Thyroid Function Tests: Recent Labs    11/17/20 0526  TSH 2.553   Anemia Panel: Recent Labs    11/17/20 0526  VITAMINB12 391   Sepsis Labs: No results for input(s): PROCALCITON, LATICACIDVEN in the last 168 hours.  Recent Results (from the past 240 hour(s))  Resp Panel by RT-PCR (Flu A&B, Covid) Nasopharyngeal Swab     Status: None   Collection Time: 11/16/20  1:54 PM   Specimen: Nasopharyngeal Swab; Nasopharyngeal(NP) swabs in vial transport medium  Result Value Ref Range Status   SARS Coronavirus 2 by RT PCR NEGATIVE NEGATIVE Final    Comment: (NOTE) SARS-CoV-2 target nucleic acids are NOT DETECTED.  The SARS-CoV-2 RNA is generally detectable in upper respiratory specimens during the acute phase of infection. The lowest concentration of SARS-CoV-2  viral copies this assay can detect is 138 copies/mL. A negative result does not preclude SARS-Cov-2 infection and should not be used as the sole basis for treatment or other patient management decisions. A negative result may occur with  improper specimen collection/handling, submission of specimen other than nasopharyngeal swab, presence of viral mutation(s) within the areas targeted by this assay, and inadequate number of viral copies(<138 copies/mL). A negative result must be combined with clinical observations, patient history, and epidemiological information. The expected result is Negative.  Fact Sheet for Patients:  BloggerCourse.com  Fact Sheet for Healthcare Providers:  SeriousBroker.it  This test is no t yet approved or cleared by the [place] FDA and  has been authorized for detection and/or diagnosis of SARS-CoV-2 by FDA under an Emergency Use Authorization (EUA). This EUA will remain  in effect (meaning this test can be used) for the duration of  the COVID-19 declaration under Section 564(b)(1) of the Act, 21 U.S.C.section 360bbb-3(b)(1), unless the authorization is terminated  or revoked sooner.       Influenza A by PCR NEGATIVE NEGATIVE Final   Influenza B by PCR NEGATIVE NEGATIVE Final    Comment: (NOTE) The Xpert Xpress SARS-CoV-2/FLU/RSV plus assay is intended as an aid in the diagnosis of influenza from Nasopharyngeal swab specimens and should not be used as a sole basis for treatment. Nasal washings and aspirates are unacceptable for Xpert Xpress SARS-CoV-2/FLU/RSV testing.  Fact Sheet for Patients: BloggerCourse.com  Fact Sheet for Healthcare Providers: SeriousBroker.it  This test is not yet approved or cleared by the [place] FDA and has been authorized for detection and/or diagnosis of SARS-CoV-2 by FDA under an Emergency Use Authorization (EUA). This EUA will remain in effect (meaning this test can be used) for the duration of the COVID-19 declaration under Section 564(b)(1) of the Act, 21 U.S.C. section 360bbb-3(b)(1), unless the authorization is terminated or revoked.  Performed at [facility], [address], [place], [place] [address]          Radiology Studies: CT Head Wo Contrast  Result Date: 11/16/2020 CLINICAL DATA:  Fall, altered mental status, confusion, headache EXAM: CT HEAD WITHOUT CONTRAST TECHNIQUE: Contiguous axial images were obtained from the base of the skull through the vertex without intravenous contrast. COMPARISON:  01/17/2020 FINDINGS: Brain: No evidence of acute infarction, hemorrhage, hydrocephalus, extra-axial collection or mass lesion/mass effect. Extensive periventricular and deep white matter hypodensity. Encephalomalacia of the right caudate and adjacent corona radiata (series 2, image 15). Vascular: No hyperdense vessel or unexpected calcification. Skull: Normal. Negative for fracture or focal lesion.  Sinuses/Orbits: No acute finding. Other: None. IMPRESSION: 1. No acute intracranial pathology. 2. Advanced small-vessel white matter disease. Encephalomalacia of the right caudate and adjacent corona radiata, in keeping with prior infarction. Electronically Signed   By: Lauralyn Primes M.D.   On: 11/16/2020 14:23   MR ANGIO HEAD WO CONTRAST  Result Date: 11/17/2020 CLINICAL DATA:  Stroke follow-up EXAM: MRA HEAD WITHOUT CONTRAST TECHNIQUE: Angiographic images of the Circle of Willis were acquired using MRA technique without intravenous contrast. COMPARISON:  01/17/2020 FINDINGS: Anterior circulation: The carotid siphons are diffusely patent. Aplastic or hypoplastic left A1 segment. Atheromatous irregularity of bilateral MCA branches which is prominent. Chronic high-grade apearing left ICA terminus stenosis. Moderate to advanced left M2 branch stenosis. Posterior circulation: Chronic occlusion of the left vertebral artery with faint distal V4 segment flow which could be retrograde. There is multifocal atheromatous irregularity of the right vertebral and basilar artery with  tandem high-grade basilar stenosis. Proximal basilar narrowing has progressed. There is a small basilar in the setting of fetal type bilateral PCA flow. Moderate bilateral PCA atheromatous irregularity and narrowing which appears progressed Anatomic variants: As above IMPRESSION: Advanced intracranial atherosclerosis with multifocal high-grade narrowing that is progressed from 2021. Electronically Signed   By: Marnee Spring M.D.   On: 11/17/2020 08:43   MR ANGIO NECK WO CONTRAST  Result Date: 11/17/2020 CLINICAL DATA:  Stroke follow-up EXAM: MRA NECK WITHOUT CONTRAST TECHNIQUE: Angiographic images of the neck were acquired using MRA technique without intravenous contrast. Carotid stenosis measurements (when applicable) are obtained utilizing NASCET criteria, using the distal internal carotid diameter as the denominator. COMPARISON:   01/17/2020 FINDINGS: Aortic arch: Limited coverage is negative. Right carotid system: No stenosis or beading seen. Left carotid system: No stenosis or beading seen. Vertebral arteries: Covered proximal subclavian arteries are patent but significantly motion degraded evaluation. Codominant vertebral arteries are patent to the dura. The left vertebral shows less intense flow by 3D time-of-flight, but is still patent in the neck and actually improved from prior IMPRESSION: 1. No emergent finding or change from 2021. 2. Chronically diminished flow in the left vertebral artery likely due to non resolved proximal stenosis. Electronically Signed   By: Marnee Spring M.D.   On: 11/17/2020 09:05   MR BRAIN W WO CONTRAST  Result Date: 11/16/2020 EXAM: MRI HEAD WITHOUT AND WITH CONTRAST TECHNIQUE: Multiplanar, multiecho pulse sequences of the brain and surrounding structures were obtained without and with intravenous contrast. CONTRAST:  6mL GADAVIST GADOBUTROL 1 MMOL/ML IV SOLN COMPARISON:  Same day CT head and MRI from 01/17/2020. FINDINGS: Motion limited evaluation.  Within this limitation: Brain: Restricted diffusion in the juxtacortical right frontal lobe without enhancement, compatible with acute infarct. Additional milder DWI hyperintensity with milder restricted diffusion in the left aspect of the splenium of the corpus callosum and left occipital lobe. Evolution of the previously seen right basal ganglia and corona radiata infarcts with encephalomalacia in these areas. There is extensive patchy and confluent additional T2/FLAIR hyperintensities within the supratentorial and pontine white matter, most likely related to chronic microvascular ischemic disease. Small remote infarct in the left frontal white matter. No hydrocephalus. Wallerian degeneration of the right midbrain. No evidence of acute hemorrhage. No extra-axial fluid collection. No abnormal enhancement. Vascular: Poor left vertebral artery flow void,  likely related to the occluded left vertebral artery is seen on prior MRA. The other major arterial flow voids are maintained at the skull base. Skull and upper cervical spine: Normal marrow signal. Sinuses/Orbits: Visualized sinuses are clear. No acute orbital findings. Other: Small left mastoid effusion. IMPRESSION: 1. Acute infarct in the juxtacortical right frontal lobe. Additional areas of milder restricted diffusion in the left aspect of the splenium of the corpus callosum and the left occipital lobe may represent subacute infarcts. 2. Evolution of the previously seen right basal ganglia and corona radiata infarcts with encephalomalacia in these areas. The 3. Severe chronic microvascular ischemic disease. 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 Electronically Signed   By: Feliberto Harts MD   On: 11/16/2020 16:32   ECHOCARDIOGRAM COMPLETE  Result Date: 11/17/2020    ECHOCARDIOGRAM REPORT   Patient Name:   SYESHA THAW [facility] Date of Exam: 11/17/2020 Medical Rec #:  [id number]      Height:       60.0 in Accession #:    [id number]  Weight:       136.0 lb Date of Birth:  1941/03/09      BSA:          1.584 m Patient Age:    27 years       BP:           147/104 mmHg Patient Gender: F              HR:           63 bpm. Exam Location:  Inpatient Procedure: 2D Echo, Cardiac Doppler and Color Doppler Indications:    CVA  History:        Patient has prior history of Echocardiogram examinations, most                 recent 01/18/2020. Stroke; Risk Factors:Diabetes, Dyslipidemia and                 Hypertension.  Sonographer:    Neomia Dear RDCS Referring Phys: [id number] ALLISON WOLFE IMPRESSIONS  1. Left ventricular ejection fraction, by estimation, is 70 to 75%. The left ventricle has hyperdynamic function. The left ventricle has no regional wall motion abnormalities. Left ventricular diastolic parameters are consistent with Grade I diastolic  dysfunction (impaired relaxation).  2. Right ventricular systolic function is normal. The right ventricular size is normal. There is normal pulmonary artery systolic pressure.  3. The mitral valve is normal in structure. Trivial mitral valve regurgitation.  4. The aortic valve is grossly normal. Aortic valve regurgitation is not visualized. No aortic stenosis is present. FINDINGS  Left Ventricle: Left ventricular ejection fraction, by estimation, is 70 to 75%. The left ventricle has hyperdynamic function. The left ventricle has no regional wall motion abnormalities. The left ventricular internal cavity size was small. There is no  left ventricular hypertrophy. Left ventricular diastolic parameters are consistent with Grade I diastolic dysfunction (impaired relaxation). Right Ventricle: The right ventricular size is normal. Right vetricular wall thickness was not well visualized. Right ventricular systolic function is normal. There is normal pulmonary artery systolic pressure. The tricuspid regurgitant velocity is 2.59 m/s, and with an assumed right atrial pressure of 3 mmHg, the estimated right ventricular systolic pressure is 29.8 mmHg. Left Atrium: Left atrial size was normal in size. Right Atrium: Right atrial size was normal in size. Pericardium: There is no evidence of pericardial effusion. Mitral Valve: The mitral valve is normal in structure. Trivial mitral valve regurgitation. Tricuspid Valve: The tricuspid valve is grossly normal. Tricuspid valve regurgitation is mild. Aortic Valve: The aortic valve is grossly normal. Aortic valve regurgitation is not visualized. No aortic stenosis is present. Aortic valve mean gradient measures 3.0 mmHg. Aortic valve peak gradient measures 4.5 mmHg. Aortic valve area, by VTI measures 3.26 cm. Pulmonic Valve: The pulmonic valve was grossly normal. Pulmonic valve regurgitation is not visualized. Aorta: The aortic root and ascending aorta are structurally normal, with no  evidence of dilitation. IAS/Shunts: The interatrial septum was not well visualized.  LEFT VENTRICLE PLAX 2D LVIDd:         5.00 cm     Diastology LVIDs:         2.10 cm     LV e' medial:    3.15 cm/s LV PW:         1.00 cm     LV E/e' medial:  19.5 LV IVS:        0.90 cm     LV e' lateral:   5.22 cm/s LVOT diam:  2.20 cm     LV E/e' lateral: 11.8 LV SV:         80 LV SV Index:   51 LVOT Area:     3.80 cm  LV Volumes (MOD) LV vol d, MOD A2C: 17.1 ml LV vol d, MOD A4C: 47.5 ml LV vol s, MOD A2C: 8.8 ml LV vol s, MOD A4C: 15.6 ml LV SV MOD A2C:     8.3 ml LV SV MOD A4C:     47.5 ml LV SV MOD BP:      19.1 ml RIGHT VENTRICLE RV S prime:     8.38 cm/s TAPSE (M-mode): 2.4 cm LEFT ATRIUM             Index LA Vol (A2C):   11.9 ml 7.51 ml/m LA Vol (A4C):   43.1 ml 27.20 ml/m LA Biplane Vol: 24.5 ml 15.46 ml/m  AORTIC VALVE                   PULMONIC VALVE AV Area (Vmax):    2.86 cm    PV Vmax:       0.92 m/s AV Area (Vmean):   2.87 cm    PV Vmean:      61.100 cm/s AV Area (VTI):     3.26 cm    PV VTI:        0.213 m AV Vmax:           106.00 cm/s PV Peak grad:  3.4 mmHg AV Vmean:          74.200 cm/s PV Mean grad:  2.0 mmHg AV VTI:            0.246 m AV Peak Grad:      4.5 mmHg AV Mean Grad:      3.0 mmHg LVOT Vmax:         79.80 cm/s LVOT Vmean:        56.000 cm/s LVOT VTI:          0.211 m LVOT/AV VTI ratio: 0.86  AORTA Ao Root diam: 2.70 cm Ao Asc diam:  2.90 cm MITRAL VALVE               TRICUSPID VALVE MV Area (PHT): 2.55 cm    TR Peak grad:   26.8 mmHg MV Decel Time: 298 msec    TR Vmax:        259.00 cm/s MV E velocity: 61.40 cm/s MV A velocity: 86.70 cm/s  SHUNTS MV E/A ratio:  0.71        Systemic VTI:  0.21 m                            Systemic Diam: 2.20 cm Kristeen Miss MD Electronically signed by Kristeen Miss MD Signature Date/Time: 11/17/2020/12:31:16 PM    Final         Scheduled Meds:   stroke: mapping our early stages of recovery book   Does not apply Once   aspirin EC  81 mg Oral Daily    [START ON 11/18/2020] atorvastatin  80 mg Oral Daily   clopidogrel  75 mg Oral Daily   cyanocobalamin  1,000 mcg Intramuscular Daily   enoxaparin (LOVENOX) injection  30 mg Subcutaneous Q24H   insulin aspart  0-9 Units Subcutaneous TID WC   losartan  100 mg Oral Daily   metoprolol tartrate  25 mg Oral BID   sertraline  25  mg Oral Daily   Continuous Infusions:  cefTRIAXone (ROCEPHIN)  IV       LOS: 0 days    Time spent: 45 minutes spent on chart review, discussion with nursing staff, consultants, updating family and interview/physical exam; more than 50% of that time was spent in counseling and/or coordination of care.    Alvira Philips [place], DO [facility] Available via Epic secure chat 7am-7pm After these hours, please refer to coverage provider listed on [email or web address] 11/17/2020, 1:46 PM

## 2020-11-17 NOTE — Progress Notes (Signed)
*  PRELIMINARY RESULTS* Echocardiogram 2D Echocardiogram has been performed.  Neomia Dear RDCS 11/17/2020, 9:58 AM

## 2020-11-17 NOTE — ED Notes (Signed)
Pt in MRI.

## 2020-11-17 NOTE — ED Notes (Signed)
Report given to [facility] 

## 2020-11-17 NOTE — Progress Notes (Signed)
Patient received via [facility] transport into 3w26. Oriented patient and daughter to room and unit routine. Bed low locked and alarmed for safety. MD notified of arrival to room.

## 2020-11-17 NOTE — ED Notes (Signed)
[facility] called  

## 2020-11-17 NOTE — Consult Note (Addendum)
No charge. Please see my consult note from same day.  [name] [facility] Pager Number [phone]

## 2020-11-17 NOTE — Progress Notes (Signed)
PROGRESS NOTE  Latrelle DodrillRuth E Fulwider JWJ:[id number]:[id number] DOB: 29-Jan-1941 DOA: 11/16/2020 PCP: Caesar BookmanNgetich, Dinah C, NP  HPI/Recap of past 2524 hours: 80 year old female with past medical history significant for prior CVA, hypertension, hyperlipidemia, type 2 diabetes mellitus, who admitted due to fall and feeling generally weak for 3 weeks.  MRI revealed acute stroke.  Neurology was consulted.  She is also found to have a metabolic encephalopathy with urinary tract infection  Patient  NOT seen: [facility]  Assessment/Plan: Principal Problem:   Acute CVA (cerebrovascular accident) [facility](HCC) Active Problems:   Essential hypertension   Hyperlipidemia LDL goal <100   Type 2 diabetes mellitus without complication, without long-term current use of insulin (HCC)   Urinary tract infection   CKD (chronic kidney disease), stage III (HCC)  #1 acute CVA MRI shows acute infarct Neurology recommended CTA of the head and neck but due to allergy to dye MRA head and neck without contrast will be done. 2D echo pending. Keep n.p.o. until assessed by speech therapy patient had issues/bedside swallow screen Speech therapy occupational therapy and physical therapy pending Patient received a loading dose of Plavix 300 mg x 1 in the ED per neurology recommendation and.  She will be continued on aspirin 81 mg Maintain permissive blood pressure in the first 24 hours  2.  Essential hypertension.Marland Kitchen.  Permissive blood pressure due to acute stroke  3.  Aphasia/acute metabolic encephalopathy Likely due to stroke. Also may be made worse by the urinary tract infection.  Treating urinary tract infection  4.  Urinary tract infection. Started on ceftriaxone. 9 IV hydration. Will follow up on culture  5.  Hyperlipidemia.  LDL is 167 with total cholesterol of 277 and HDL of 36 LDL goal should be less than 100 Continue atorvastatin  6.  Type 2 diabetes mellitus without  complication Her home metformin is being held.  We will continue to hold due to elevated creatinine   7.  AKI with chronic kidney disease.  Stage III, elevated creatinine of 1.4. Will hold metformin.  Gentle IV hydration Avoid nephrotoxic medication   Code Status: DNR  Severity of Illness:  Disposition Plan: FOR SNF             Anticipated d/c is to: SNF (daughter would like her to go to a SNF as she doesn't feel like she is safe at home alone)   Consultants: Neurology  Procedures: None Antimicrobials: Ceftriaxone  DVT prophylaxis: Lovenox   Objective: Vitals:   11/17/20 0300 11/17/20 0345 11/17/20 0450 11/17/20 0600  BP: (!) 155/69 (!) 164/59 (!) 164/52 (!) 183/78  Pulse:   65 (!) 55  Resp: 17 17 17 18   Temp:      TempSrc:      SpO2: 100% 100% 100% 100%  Weight:        Intake/Output Summary (Last 24 hours) at 11/17/2020 0905 Last data filed at 11/16/2020 1747 Gross per 24 hour  Intake 96.81 ml  Output --  Net 96.81 ml   Filed Weights   11/16/20 1208  Weight: 61.7 kg   Body mass index is 26.56 kg/m.  Exam: Patient not examined    Data Reviewed: CBC: Recent Labs  Lab 11/16/20 1349  WBC 7.4  NEUTROABS 5.9  HGB 15.2*  HCT 45.9  MCV 93.7  PLT 244   Basic Metabolic Panel: Recent Labs  Lab 11/16/20 1349 11/17/20 0527  NA 138 137  K 3.7 3.5  CL 100  101  CO2 29 27  GLUCOSE 149* 141*  BUN 16 18  CREATININE 1.43* 1.41*  CALCIUM 9.7 9.3   GFR: Estimated Creatinine Clearance: 27 mL/min (A) (by C-G formula based on SCr of 1.41 mg/dL (H)). Liver Function Tests: Recent Labs  Lab 11/16/20 1349  AST 43*  ALT 36  ALKPHOS 158*  BILITOT 0.9  PROT 7.7  ALBUMIN 4.1   No results for input(s): LIPASE, AMYLASE in the last 168 hours. Recent Labs  Lab 11/17/20 0525  AMMONIA 10   Coagulation Profile: Recent Labs  Lab 11/16/20 1349  INR 0.9   Cardiac Enzymes: No results for input(s): CKTOTAL, CKMB, CKMBINDEX, TROPONINI in the last 168  hours. BNP (last 3 results) No results for input(s): PROBNP in the last 8760 hours. HbA1C: No results for input(s): HGBA1C in the last 72 hours. CBG: Recent Labs  Lab 11/16/20 2245  GLUCAP 152*   Lipid Profile: Recent Labs    11/17/20 0525  CHOL 227*  HDL 36*  LDLCALC 167*  TRIG 120  CHOLHDL 6.3   Thyroid Function Tests: Recent Labs    11/17/20 0526  TSH 2.553   Anemia Panel: Recent Labs    11/17/20 0526  VITAMINB12 391   Urine analysis:    Component Value Date/Time   COLORURINE YELLOW 11/16/2020 1440   APPEARANCEUR CLOUDY (A) 11/16/2020 1440   LABSPEC 1.010 11/16/2020 1440   PHURINE 7.0 11/16/2020 1440   GLUCOSEU NEGATIVE 11/16/2020 1440   HGBUR SMALL (A) 11/16/2020 1440   BILIRUBINUR NEGATIVE 11/16/2020 1440   BILIRUBINUR Large 10/11/2020 1540   KETONESUR NEGATIVE 11/16/2020 1440   PROTEINUR 100 (A) 11/16/2020 1440   UROBILINOGEN negative (A) 10/11/2020 1540   UROBILINOGEN 0.2 05/09/2007 1427   NITRITE NEGATIVE 11/16/2020 1440   LEUKOCYTESUR LARGE (A) 11/16/2020 1440   Sepsis Labs: @LABRCNTIP (procalcitonin:4,lacticidven:4)  ) Recent Results (from the past 240 hour(s))  Resp Panel by RT-PCR (Flu A&B, Covid) Nasopharyngeal Swab     Status: None   Collection Time: 11/16/20  1:54 PM   Specimen: Nasopharyngeal Swab; Nasopharyngeal(NP) swabs in vial transport medium  Result Value Ref Range Status   SARS Coronavirus 2 by RT PCR NEGATIVE NEGATIVE Final    Comment: (NOTE) SARS-CoV-2 target nucleic acids are NOT DETECTED.  The SARS-CoV-2 RNA is generally detectable in upper respiratory specimens during the acute phase of infection. The lowest concentration of SARS-CoV-2 viral copies this assay can detect is 138 copies/mL. A negative result does not preclude SARS-Cov-2 infection and should not be used as the sole basis for treatment or other patient management decisions. A negative result may occur with  improper specimen collection/handling, submission of  specimen other than nasopharyngeal swab, presence of viral mutation(s) within the areas targeted by this assay, and inadequate number of viral copies(<138 copies/mL). A negative result must be combined with clinical observations, patient history, and epidemiological information. The expected result is Negative.  Fact Sheet for Patients:  01/17/21  Fact Sheet for Healthcare Providers:  BloggerCourse.com  This test is no t yet approved or cleared by the [place] FDA and  has been authorized for detection and/or diagnosis of SARS-CoV-2 by FDA under an Emergency Use Authorization (EUA). This EUA will remain  in effect (meaning this test can be used) for the duration of the COVID-19 declaration under Section 564(b)(1) of the Act, 21 U.S.C.section 360bbb-3(b)(1), unless the authorization is terminated  or revoked sooner.       Influenza A by PCR NEGATIVE NEGATIVE Final   Influenza  B by PCR NEGATIVE NEGATIVE Final    Comment: (NOTE) The Xpert Xpress SARS-CoV-2/FLU/RSV plus assay is intended as an aid in the diagnosis of influenza from Nasopharyngeal swab specimens and should not be used as a sole basis for treatment. Nasal washings and aspirates are unacceptable for Xpert Xpress SARS-CoV-2/FLU/RSV testing.  Fact Sheet for Patients: BloggerCourse.com  Fact Sheet for Healthcare Providers: SeriousBroker.it  This test is not yet approved or cleared by the [place] FDA and has been authorized for detection and/or diagnosis of SARS-CoV-2 by FDA under an Emergency Use Authorization (EUA). This EUA will remain in effect (meaning this test can be used) for the duration of the COVID-19 declaration under Section 564(b)(1) of the Act, 21 U.S.C. section 360bbb-3(b)(1), unless the authorization is terminated or revoked.  Performed at [facility], [address], [place], [place] [address]       Studies: CT Head Wo Contrast  Result Date: 11/16/2020 CLINICAL DATA:  Fall, altered mental status, confusion, headache EXAM: CT HEAD WITHOUT CONTRAST TECHNIQUE: Contiguous axial images were obtained from the base of the skull through the vertex without intravenous contrast. COMPARISON:  01/17/2020 FINDINGS: Brain: No evidence of acute infarction, hemorrhage, hydrocephalus, extra-axial collection or mass lesion/mass effect. Extensive periventricular and deep white matter hypodensity. Encephalomalacia of the right caudate and adjacent corona radiata (series 2, image 15). Vascular: No hyperdense vessel or unexpected calcification. Skull: Normal. Negative for fracture or focal lesion. Sinuses/Orbits: No acute finding. Other: None. IMPRESSION: 1. No acute intracranial pathology. 2. Advanced small-vessel white matter disease. Encephalomalacia of the right caudate and adjacent corona radiata, in keeping with prior infarction. Electronically Signed   By: Lauralyn Primes M.D.   On: 11/16/2020 14:23   MR ANGIO HEAD WO CONTRAST  Result Date: 11/17/2020 CLINICAL DATA:  Stroke follow-up EXAM: MRA HEAD WITHOUT CONTRAST TECHNIQUE: Angiographic images of the Circle of Willis were acquired using MRA technique without intravenous contrast. COMPARISON:  01/17/2020 FINDINGS: Anterior circulation: The carotid siphons are diffusely patent. Aplastic or hypoplastic left A1 segment. Atheromatous irregularity of bilateral MCA branches which is prominent. Chronic high-grade apearing left ICA terminus stenosis. Moderate to advanced left M2 branch stenosis. Posterior circulation: Chronic occlusion of the left vertebral artery with faint distal V4 segment flow which could be retrograde. There is multifocal atheromatous irregularity of the right vertebral and basilar artery with tandem high-grade basilar stenosis. Proximal basilar narrowing has progressed. There is a small basilar in the setting  of fetal type bilateral PCA flow. Moderate bilateral PCA atheromatous irregularity and narrowing which appears progressed Anatomic variants: As above IMPRESSION: Advanced intracranial atherosclerosis with multifocal high-grade narrowing that is progressed from 2021. Electronically Signed   By: Marnee Spring M.D.   On: 11/17/2020 08:43   MR BRAIN W WO CONTRAST  Result Date: 11/16/2020 EXAM: MRI HEAD WITHOUT AND WITH CONTRAST TECHNIQUE: Multiplanar, multiecho pulse sequences of the brain and surrounding structures were obtained without and with intravenous contrast. CONTRAST:  72mL GADAVIST GADOBUTROL 1 MMOL/ML IV SOLN COMPARISON:  Same day CT head and MRI from 01/17/2020. FINDINGS: Motion limited evaluation.  Within this limitation: Brain: Restricted diffusion in the juxtacortical right frontal lobe without enhancement, compatible with acute infarct. Additional milder DWI hyperintensity with milder restricted diffusion in the left aspect of the splenium of the corpus callosum and left occipital lobe. Evolution of the previously seen right basal ganglia and corona radiata infarcts with encephalomalacia in these areas. There is extensive patchy and confluent additional T2/FLAIR hyperintensities within the supratentorial  and pontine white matter, most likely related to chronic microvascular ischemic disease. Small remote infarct in the left frontal white matter. No hydrocephalus. Wallerian degeneration of the right midbrain. No evidence of acute hemorrhage. No extra-axial fluid collection. No abnormal enhancement. 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 The other major arterial flow voids are maintained at the skull base. Skull and upper cervical spine: Normal marrow signal. Sinuses/Orbits: Visualized sinuses are clear. No acute orbital findings. Other: Small left mastoid effusion. IMPRESSION: 1. Acute infarct in the juxtacortical  right frontal lobe. Additional areas of milder restricted diffusion in the left aspect of the splenium of the corpus callosum and the left occipital lobe may represent subacute infarcts. 2. Evolution of the previously seen right basal ganglia and corona radiata infarcts with encephalomalacia in these areas. The 3. Severe chronic microvascular ischemic disease. 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 Electronically Signed   By: Feliberto Harts MD   On: 11/16/2020 16:32    Scheduled Meds:   stroke: mapping our early stages of recovery book   Does not apply Once   aspirin EC  81 mg Oral Daily   atorvastatin  40 mg Oral Daily   enoxaparin (LOVENOX) injection  30 mg Subcutaneous Q24H   insulin aspart  0-9 Units Subcutaneous TID WC   losartan  100 mg Oral Daily   metoprolol tartrate  25 mg Oral BID   sertraline  25 mg Oral Daily    Continuous Infusions:  cefTRIAXone (ROCEPHIN)  IV       LOS: 0 days     Myrtie Neither, MD [facility]  To reach me or the doctor on call, go to: [email or web address] Password [email or web address]  11/17/2020, 9:05 AM

## 2020-11-17 NOTE — ED Notes (Signed)
Pt left with [facility] 

## 2020-11-18 ENCOUNTER — Inpatient Hospital Stay (HOSPITAL_COMMUNITY): Payer: Medicare HMO

## 2020-11-18 ENCOUNTER — Encounter (HOSPITAL_COMMUNITY): Payer: Self-pay | Admitting: Family Medicine

## 2020-11-18 DIAGNOSIS — R4182 Altered mental status, unspecified: Secondary | ICD-10-CM

## 2020-11-18 DIAGNOSIS — E785 Hyperlipidemia, unspecified: Secondary | ICD-10-CM

## 2020-11-18 DIAGNOSIS — N3001 Acute cystitis with hematuria: Secondary | ICD-10-CM

## 2020-11-18 LAB — COMPREHENSIVE METABOLIC PANEL
ALT: 80 U/L — ABNORMAL HIGH (ref 0–44)
AST: 104 U/L — ABNORMAL HIGH (ref 15–41)
Albumin: 2.8 g/dL — ABNORMAL LOW (ref 3.5–5.0)
Alkaline Phosphatase: 187 U/L — ABNORMAL HIGH (ref 38–126)
Anion gap: 10 (ref 5–15)
BUN: 24 mg/dL — ABNORMAL HIGH (ref 8–23)
CO2: 24 mmol/L (ref 22–32)
Calcium: 9 mg/dL (ref 8.9–10.3)
Chloride: 103 mmol/L (ref 98–111)
Creatinine, Ser: 1.6 mg/dL — ABNORMAL HIGH (ref 0.44–1.00)
GFR, Estimated: 33 mL/min — ABNORMAL LOW (ref >60)
Glucose, Bld: 160 mg/dL — ABNORMAL HIGH (ref 70–99)
Potassium: 3.4 mmol/L — ABNORMAL LOW (ref 3.5–5.1)
Sodium: 137 mmol/L (ref 135–145)
Total Bilirubin: 0.6 mg/dL (ref 0.3–1.2)
Total Protein: 5.7 g/dL — ABNORMAL LOW (ref 6.5–8.1)

## 2020-11-18 LAB — GLUCOSE, CAPILLARY
Glucose-Capillary: 161 mg/dL — ABNORMAL HIGH (ref 70–99)
Glucose-Capillary: 148 mg/dL — ABNORMAL HIGH (ref 70–99)
Glucose-Capillary: 136 mg/dL — ABNORMAL HIGH (ref 70–99)

## 2020-11-18 LAB — MAGNESIUM: Magnesium: 2.2 mg/dL (ref 1.7–2.4)

## 2020-11-18 MED ORDER — LOSARTAN POTASSIUM 50 MG PO TABS
50.0000 mg | ORAL_TABLET | Freq: Every day | ORAL | Status: DC
  Administered 2020-11-18: 50 mg via ORAL
  Filled 2020-11-18: qty 1

## 2020-11-18 MED ORDER — TRAZODONE HCL 50 MG PO TABS
25.0000 mg | ORAL_TABLET | Freq: Every day | ORAL | Status: DC
  Administered 2020-11-18 – 2020-11-26 (×9): 25 mg via ORAL
  Filled 2020-11-18 (×9): qty 1

## 2020-11-18 MED ORDER — MELATONIN 3 MG PO TABS
3.0000 mg | ORAL_TABLET | Freq: Every day | ORAL | Status: DC
  Administered 2020-11-18 – 2020-11-26 (×9): 3 mg via ORAL
  Filled 2020-11-18 (×9): qty 1

## 2020-11-18 MED ORDER — METOPROLOL TARTRATE 25 MG PO TABS
25.0000 mg | ORAL_TABLET | Freq: Two times a day (BID) | ORAL | Status: DC
  Administered 2020-11-18 – 2020-11-26 (×18): 25 mg via ORAL
  Filled 2020-11-18 (×18): qty 1

## 2020-11-18 MED ORDER — ASPIRIN EC 325 MG PO TBEC
325.0000 mg | DELAYED_RELEASE_TABLET | Freq: Every day | ORAL | Status: DC
  Administered 2020-11-19 – 2020-11-26 (×8): 325 mg via ORAL
  Filled 2020-11-18 (×8): qty 1

## 2020-11-18 MED ORDER — POTASSIUM CHLORIDE 20 MEQ PO PACK
40.0000 meq | PACK | Freq: Once | ORAL | Status: AC
  Administered 2020-11-18: 40 meq via ORAL
  Filled 2020-11-18: qty 2

## 2020-11-18 NOTE — Progress Notes (Signed)
Inpatient Rehabilitation Admissions Coordinator   I met at bedside with patient an her daughter. We discussed goals and expectations of a possible CIR admit. After her last CVA, she received AIR at [facility] due to no beds available here at [facility]. They prefer CIR if possible .I will begin insurance Auth with [facility] for a possible Cir admit pending approval and bed available .  Danne Baxter, RN, MSN Rehab Admissions Coordinator [phone] 11/18/2020 2:37 PM

## 2020-11-18 NOTE — Consult Note (Signed)
Reason for Consult: Stroke.  Referring Physician: E. [place], MD.    CC: Stroke on MRI brain.    History is obtained from: Daughters and chart.    HPI: Heather [name] is a 80 y.o. female with a PMHx of ischemic stroke in 2021, HTN, DM, CKD III, and HLD who presented to [facility] ED yesterday with confusion, non sensical speech. No dysphagia, HA, weakness of an extremity, facial droop, nor dysarthria. Daughter found her last pm with her PJ top buttoned around her leg and later, found patient lying face down on the floor with her pants 1/2 way down and her shirt pulled up. No one witnessed the fall. No history of seizures. Patient had COVID 3 weeks ago, and per daughter has not been herself sense. Sequelae from 9/21 stroke include some left sided weakness and memory issues.   Workup in ED included findings of acute stroke and possible subacute stroke and a UTI. Other workup thus far-LDL 167, Vitamin B12 391, UDJ4H 6.6, TSH 2.553, ammonia 10, + UA, and glucose 152-181.   FMHx: Stroke in mother.   Daughters report patient was in hospital in [place], [place] in 2021 with acute stroke. She took DAPT x 21 days, then ASA alone. White matter disease present and MD told daughter's that the patient likely had dementia. Patient has f/up with out patient neuro since that hospitalization, but no formal cognitive testing has been completed.   MRS: Uses a walker. Can bathe and dress herself. Is able to cook.   Neurology asked to consult for stroke.     ROS: A robust ROS was performed and is negative except as noted in the HPI.       Past Medical History:  Diagnosis Date   Diabetes mellitus without complication (HCC)     High cholesterol     History of stroke 2021   Hypertension             Family History  Problem Relation Age of Onset   Stroke Mother     Lung cancer Father     Cancer Daughter     Hypertension Daughter     High Cholesterol Daughter      Social History:   reports that she has never  smoked. She has never used smokeless tobacco. She reports that she does not drink alcohol and does not use drugs.   Medications   Current Facility-Administered Medications:    stroke: mapping our early stages of recovery book, , Does not apply, Once, Orland Mustard, MD   acetaminophen (TYLENOL) tablet 650 mg, 650 mg, Oral, Q4H PRN **OR** acetaminophen (TYLENOL) 160 MG/5ML solution 650 mg, 650 mg, Per Tube, Q4H PRN **OR** acetaminophen (TYLENOL) suppository 650 mg, 650 mg, Rectal, Q4H PRN, Orland Mustard, MD   aspirin EC tablet 81 mg, 81 mg, Oral, Daily, Orland Mustard, MD, 81 mg at 11/17/20 0914   [START ON 11/18/2020] atorvastatin (LIPITOR) tablet 80 mg, 80 mg, Oral, Daily, Kirby-Graham, Beather Arbour, NP   cefTRIAXone (ROCEPHIN) 1 g in sodium chloride 0.9 % 100 mL IVPB, 1 g, Intravenous, Q24H, Orland Mustard, MD   clopidogrel (PLAVIX) tablet 75 mg, 75 mg, Oral, Daily, Kirby-Graham, Beather Arbour, NP   cyanocobalamin ((VITAMIN B-12)) injection 1,000 mcg, 1,000 mcg, Intramuscular, Daily, Kirby-Graham, Beather Arbour, NP   enoxaparin (LOVENOX) injection 30 mg, 30 mg, Subcutaneous, Q24H, Orland Mustard, MD, 30 mg at 11/16/20 2106   hydrALAZINE (APRESOLINE) injection 5 mg, 5 mg, Intravenous, Q8H PRN, Orland Mustard, MD, 5 mg  at 11/16/20 2106   insulin aspart (novoLOG) injection 0-9 Units, 0-9 Units, Subcutaneous, TID WC, Orland MustardWolfe, Allison, MD   LORazepam (ATIVAN) injection 0.5 mg, 0.5 mg, Intravenous, Once PRN, Arby BarrettePfeiffer, Marcy, MD   losartan (COZAAR) tablet 100 mg, 100 mg, Oral, Daily, Orland MustardWolfe, Allison, MD, 100 mg at 11/17/20 0916   metoprolol tartrate (LOPRESSOR) tablet 25 mg, 25 mg, Oral, BID, Orland MustardWolfe, Allison, MD, 25 mg at 11/17/20 0915   senna-docusate (Senokot-S) tablet 1 tablet, 1 tablet, Oral, QHS PRN, Orland MustardWolfe, Allison, MD   sertraline (ZOLOFT) tablet 25 mg, 25 mg, Oral, Daily, Orland MustardWolfe, Allison, MD, 25 mg at 11/17/20 1141   Current Outpatient Medications:   acetaminophen (TYLENOL) 500 MG tablet, Take 500 mg by mouth  every 6 (six) hours as needed for mild pain, fever or headache., Disp: , Rfl:   Ascorbic Acid (VITAMIN C PO), Take 1 tablet by mouth daily., Disp: , Rfl:   atorvastatin (LIPITOR) 40 MG tablet, Take 40 mg by mouth daily., Disp: , Rfl:   fluticasone (FLONASE) 50 MCG/ACT nasal spray, Place 1-2 sprays into both nostrils daily as needed for allergies or rhinitis., Disp: , Rfl:   losartan (COZAAR) 100 MG tablet, Take 1 tablet (100 mg total) by mouth daily., Disp: 30 tablet, Rfl: 1   metFORMIN (GLUCOPHAGE) 500 MG tablet, Take 1 tablet by mouth once daily (Patient taking differently: Take 500 mg by mouth daily.), Disp: 90 tablet, Rfl: 1   metoprolol tartrate (LOPRESSOR) 25 MG tablet, Take 1 tablet by mouth twice daily (Patient taking differently: Take 25 mg by mouth 2 (two) times daily.), Disp: 180 tablet, Rfl: 0   Multiple Vitamins-Minerals (ZINC PO), Take 1 tablet by mouth daily., Disp: , Rfl:   sertraline (ZOLOFT) 25 MG tablet, TAKE 1 TABLET BY MOUTH AT BEDTIME (Patient taking differently: Take 25 mg by mouth daily.), Disp: 90 tablet, Rfl: 0   Melatonin 10 MG TABS, Take 10 mg by mouth at bedtime as needed. (Patient not taking: Reported on 11/16/2020), Disp: 30 tablet, Rfl: 0     Exam: Current vital signs: BP (!) 153/75 (BP Location: Left Arm)   Pulse 69   Temp 98 F (36.7 C) (Oral)   Resp 15   Wt 61.7 kg   SpO2 100%   BMI 26.56 kg/m  Vital signs in last 24 hours: Temp:  [98 F (36.7 C)] 98 F (36.7 C) (07/09 1153) Pulse Rate:  [53-93] 69 (07/10 0924) Resp:  [12-24] 15 (07/10 0924) BP: (139-236)/(52-183) 153/75 (07/10 0924) SpO2:  [84 %-100 %] 100 % (07/10 0924) Weight:  [61.7 kg] 61.7 kg (07/09 1208)   PE: GENERAL: Well appearing female. Awake, alert in NAD. HEENT: normocephalic and atraumatic. LUNGS - Normal respiratory effort. CV - RRR on tele. ABDOMEN - Soft, nontender. Ext: warm, well perfused. Psych: affect normal for situation.   NEURO: Mental Status: Occasionally confused  saying things that are not correct. She perseverates at times. She is confused as to having had a stroke in the past. She is unable to tell NP how many quarters are in $2.75.  Alert and oriented to name, age, city, state, month, and year. [place] [employer] (but [place] move [place] [place] Disoriented to date and day of the week.  Speech/Language: speech is without dysarthria or aphasia. Naming, repetition, fluency, and comprehension intact.   Cranial Nerves: II: PERRL  513mm/brisk. visual fields full. III, IV, VI: EOMI. Lid elevation symmetric and full. V: sensation is intact and symmetrical to face. VII:  Smile is symmetrical.   VIII:hearing intact to voice. IX, X: palate elevation is symmetric. Phonation normal. XI: normal sternocleidomastoid and trapezius muscle strength. KWI:OXBDZH is symmetrical without fasciculations.   Motor: RUE: grips  4+/5       triceps  5/5     biceps  5/5      LUE: All 5/5.  RLE:  knee  5/5    thigh  3+/5   plantar flexion 5/5    dorsiflexion   5/5 LLE:  All 5/5.  Tone is normal. Bulk is normal. Sensation- Intact to light touch bilaterally in all four extremities. Extinction absent to light touch to DSS. Coordination: FTN intact bilaterally. HKS intact bilaterally. No pronator drift. DTRs:  2+ throughout. Gait: Deferred.      NIHSS: 1a Level of Consciousness: 0 1b LOC Questions: 0 1c LOC Commands: 0 2 Best Gaze: 0 3 Visual: 0 4 Facial Palsy: 0 5a Motor Arm - left: 0 5b Motor Arm - Right: 0 6a Motor Leg - Left: 0 6b Motor Leg - Right: 0 7 Limb Ataxia: 0 8 Sensory: 0 9 Best Language: 1 10 Dysarthria: 0 11 Extinction and Inattention: 0 TOTAL: 1   Labs I have reviewed labs in epic and the results pertinent to this consultation are: Ammonia 10. Vitamin B12 391. Creatinine 1.43 up from 2/22 of 1.23. INR 0.9.     CBC Labs (Brief)          Component Value Date/Time    WBC 7.4 11/16/2020 1349    RBC 4.90 11/16/2020 1349    HGB 15.2 (H)  11/16/2020 1349    HCT 45.9 11/16/2020 1349    PLT 244 11/16/2020 1349    MCV 93.7 11/16/2020 1349    MCH 31.0 11/16/2020 1349    MCHC 33.1 11/16/2020 1349    RDW 13.2 11/16/2020 1349    LYMPHSABS 0.8 11/16/2020 1349    MONOABS 0.5 11/16/2020 1349    EOSABS 0.1 11/16/2020 1349    BASOSABS 0.0 11/16/2020 1349        CMP     Labs (Brief)          Component Value Date/Time    NA 137 11/17/2020 0527    K 3.5 11/17/2020 0527    CL 101 11/17/2020 0527    CO2 27 11/17/2020 0527    GLUCOSE 141 (H) 11/17/2020 0527    BUN 18 11/17/2020 0527    CREATININE 1.41 (H) 11/17/2020 0527    CREATININE 1.23 (H) 06/18/2020 0000    CALCIUM 9.3 11/17/2020 0527    PROT 7.7 11/16/2020 1349    ALBUMIN 4.1 11/16/2020 1349    AST 43 (H) 11/16/2020 1349    ALT 36 11/16/2020 1349    ALKPHOS 158 (H) 11/16/2020 1349    BILITOT 0.9 11/16/2020 1349    GFRNONAA 38 (L) 11/17/2020 0527    GFRNONAA 42 (L) 06/18/2020 0000    GFRAA 49 (L) 06/18/2020 0000        Lipid Panel  Labs (Brief)          Component Value Date/Time    CHOL 227 (H) 11/17/2020 0525    TRIG 120 11/17/2020 0525    HDL 36 (L) 11/17/2020 0525    CHOLHDL 6.3 11/17/2020 0525    VLDL 24 11/17/2020 0525    LDLCALC 167 (H) 11/17/2020 0525    LDLCALC 101 (H) 06/18/2020 0000          Imaging MD reviewed the images obtained.  CT head No acute intracranial pathology. Advanced small-vessel white matter disease. Encephalomalacia of the right caudate and adjacent corona radiata, in keeping with prior infarction.   MRA head  Advanced intracranial atherosclerosis with multifocal high-grade narrowing that is progressed from 2021.   MRA neck No emergent finding or change from 2021. Chronically diminished flow in the left vertebral artery likely due to non resolved proximal stenosis.   MRI brain Acute infarct in the juxtacortical right frontal lobe. Additional areas of milder restricted diffusion in the left aspect of the splenium of  the corpus callosum and the left occipital lobe may represent subacute infarcts. Evolution of the previously seen right basal ganglia and corona radiata infarcts with encephalomalacia in these areas. Severe chronic microvascular ischemic disease.  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     Assessment: 80 yo female with stroke risk factors of DM II, HLD, HTN, intracranial atherosclerosis, CMVI, and prior stroke. Patient was not a candidate for tPA due to being outside the window. No symptoms of stroke except RLE thigh weakness and confusion. Given her stroke with evidence of intracranial atherosclerosis, she will need DAPT with Plavix and ASA for 90 days then ASA alone. We will replace B12 due to not being at Neurology goal of over 500.  Discussed presentation and symptoms of frontal stroke. Discussed intracranial stenosis and how it affects treatment.    Impression: -Acute stroke. -HLD with LDL not at goal.  -Confusion.    Recommendations/Plan: -Admit to [facility] per medicine. -Check aPTT.   -B12 1000 mcg IM qd x 3, then 250 mg po qd. -Change Lipitor 40mg  to 80mg  po qd. Goal < 70. -Echocardiogram. -HbA1c is below goal of 7. -Permissive HTN up to 220/110 x 24-48 hours, then normotensive. -Frequent neuro checks. - follow NIHSS. -Plavix 75 mg po qd x 90 days, then ASA alone. -ASA 81mg  po qd.  - Risk factor modification. - cardiac telemetry monitoring for arrhythmia. - PT consult, OT consult, Speech consult. - stroke education. -Out patient neurology f/up for after discharge and for official neurocognitive testing.  - Stroke team to follow.   Seen by , MSN, APN-BC, Neurology Nurse Practitioner and later by MD.  Pager [phone]   NEUROHOSPITALIST ADDENDUM Performed a face to face diagnostic evaluation.   I have reviewed the contents of history and physical exam as documented by  PA/ARNP/Resident and agree with above documentation.  I have discussed and formulated the above plan as documented. Edits to the note have been made as needed.  Impression/Key exam findings/Plan: 66 F with COVID 3 weeks ago who was found confused, on the floor and earlier had non sensical speech. Workup with MRI Brain with a R frontal juxtacortical acute infarct and a left copus callosum and left occipital subacute infarcts. Neuro exam with no focal deficit, she is mildly encephalopathic thou. 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 Stroke workup and DAPT x 21 days, followed by aspirin 81mg  daily alone.  Jimmye Norman, MD [facility] [phone]   If 7pm to 7am, please call on call as listed on AMION.

## 2020-11-18 NOTE — Evaluation (Signed)
Occupational Therapy Evaluation Patient Details Name: Heather [name] MRN: [id number] DOB: [birth year] Today's Date: 11/18/2020    History of Present Illness Pt is a 80 y/o female who presents with AMS. Daughter found pt initially with pajama top buttoned around her leg, and later face down on the floor with clothing disheveled after an unwitnessed fall. 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 PMH significant for DM, CVA 2021, HTN.   Clinical Impression   PTA, pt was independent and lived with her daughter. Currently, pt performing UB ADLs with Min Guard A, and LB ADLs with Min A. Pt performing grooming and toileting with Min Guard A during session. Pt with decreased attention to the R with fatigue. Pt asking tangential questions throughout session, demonstrating decreased problem solving asking therapist what to do next when going to bathroom. Pt presents with decreased strength, balance, attention, STM, activity tolerance, and safety awareness. Due to pt support, motivation, and significant change in functional status, recommend CIR for intensive OT services and will continue to follow acutely to optimize independence in ADLs/IADLs.     Follow Up Recommendations  CIR    Equipment Recommendations  None recommended by OT    Recommendations for Other Services Rehab consult     Precautions / Restrictions Precautions Precautions: Fall Restrictions Weight Bearing Restrictions: No      Mobility Bed Mobility Overal bed mobility: Needs Assistance Bed Mobility: Supine to Sit     Supine to sit: Min guard     General bed mobility comments: Pt was received standing at the sink with OT    Transfers Overall transfer level: Needs assistance Equipment used: Rolling walker (2 wheeled) Transfers: Sit to/from Stand Sit to Stand: Min assist          General transfer comment: Assist for lining hips up with the chair when backing up, and multimodal cues to reach back for seated surface.    Balance Overall balance assessment: Needs assistance Sitting-balance support: Feet supported;No upper extremity supported Sitting balance-Leahy Scale: Fair Sitting balance - Comments: Mild posterior lean with LE MMT   Standing balance support: Single extremity supported;During functional activity Standing balance-Leahy Scale: Poor Standing balance comment: Requires at least 1 UE support for standing activity.                           ADL either performed or assessed with clinical judgement   ADL Overall ADL's : Needs assistance/impaired Eating/Feeding: Set up   Grooming: Min guard;Standing;Wash/dry hands;Wash/dry face;Brushing hair Grooming Details (indicate cue type and reason): applying makeup, washing hands, washing face Upper Body Bathing: Set up;Sitting   Lower Body Bathing: Minimal assistance;Sit to/from stand   Upper Body Dressing : Set up;Sitting   Lower Body Dressing: Minimal assistance;Sit to/from stand   Toilet Transfer: Surveyor, quantity Details (indicate cue type and reason): Min Guard A for safety Toileting- Architect and Hygiene: Min guard;Sit to/from stand Toileting - Clothing Manipulation Details (indicate cue type and reason): Min Guard A for safety     Functional mobility during ADLs: Min guard;Rolling walker;Cueing for safety General ADL Comments: Pt performing grooming with min Guard A standing at sink. Cues for safety during functional mobility. Decreased attention to R when exiting bathroom.     Vision Baseline Vision/History: Wears glasses Wears Glasses: At all times Patient Visual  Report: No change from baseline (Pt reports no changes from baseline) Vision Assessment?: Vision impaired- to be further tested in functional context Additional  Comments: decreased attention to right     Perception Perception Perception Tested?: Yes Perception Deficits: Inattention/neglect Inattention/Neglect: Does not attend to right visual field;Impaired- to be further tested in functional context Spatial deficits: decreased attention to R side visualo fiedl with fatigue   Praxis Praxis Praxis tested?: Not tested Praxis-Other Comments: no apparent difficulty with motor planning    Pertinent Vitals/Pain Pain Assessment: No/denies pain     Hand Dominance Right   Extremity/Trunk Assessment Upper Extremity Assessment Upper Extremity Assessment: Defer to OT evaluation   Lower Extremity Assessment Lower Extremity Assessment: Generalized weakness (Grossly equal R and L)   Cervical / Trunk Assessment Cervical / Trunk Assessment: Other exceptions Cervical / Trunk Exceptions: forward head posture with rounded shoulders.   Communication Communication Communication: Expressive difficulties   Cognition Arousal/Alertness: Awake/alert Behavior During Therapy: WFL for tasks assessed/performed Overall Cognitive Status: Impaired/Different from baseline Area of Impairment: Orientation;Attention;Memory;Following commands;Safety/judgement;Awareness;Problem solving                 Orientation Level: Place Current Attention Level: Sustained Memory: Decreased short-term memory Following Commands: Follows one step commands consistently;Follows one step commands with increased time Safety/Judgement: Decreased awareness of safety;Decreased awareness of deficits Awareness: Emergent Problem Solving: Difficulty sequencing;Requires verbal cues;Slow processing General Comments: Pt with decreased STM, and following one step commands with increased time. Pt with decreased safety awareness and requires increased processing time throughout functional mobility. Confused at times, asking if the brown lead therapist reconnected was a monkey I was putting in  her pocket.   General Comments  Pt pleasant and conversational throughout.    Exercises     Shoulder Instructions      Home Living Family/patient expects to be discharged to:: Private residence Living Arrangements: Children Available Help at Discharge: Family Type of Home: House Home Access: Level entry     Home Layout: Two level;Able to live on main level with bedroom/bathroom Alternate Level Stairs-Number of Steps: 1 flight   Bathroom Shower/Tub: Tub/shower unit;Walk-in shower         Home Equipment: Dan Humphreys - 2 wheels;Wheelchair - manual   Additional Comments: Pt reports she lives with her daughter has a walker and a wheelchair.      Prior Functioning/Environment Level of Independence: Independent        Comments: pt enjoys walking her dog, going shopping. Pt typically leans on grocery cart for support, but does not use AD at home        OT Problem List: Decreased strength;Decreased activity tolerance;Impaired balance (sitting and/or standing);Impaired vision/perception;Decreased safety awareness;Decreased cognition;Decreased knowledge of use of DME or AE      OT Treatment/Interventions: Self-care/ADL training;Therapeutic exercise;DME and/or AE instruction;Therapeutic activities;Cognitive remediation/compensation;Patient/family education;Visual/perceptual remediation/compensation    OT Goals(Current goals can be found in the care plan section) Acute Rehab OT Goals Patient Stated Goal: "Drink my coffee" - unable to discuss bigger picture with functional deficits and rehab goals. OT Goal Formulation: With patient Time For Goal Achievement: 12/02/20 Potential to Achieve Goals: Good  OT Frequency: Min 2X/week   Barriers to D/C:            Co-evaluation              AM-PAC OT "6 Clicks" Daily Activity     Outcome Measure Help from another person eating meals?: A Little Help from another person taking care of personal grooming?:  A Little Help from  another person toileting, which includes using toliet, bedpan, or urinal?: A Little Help from another person bathing (including washing, rinsing, drying)?: A Little Help from another person to put on and taking off regular upper body clothing?: A Little Help from another person to put on and taking off regular lower body clothing?: A Little 6 Click Score: 18   End of Session Equipment Utilized During Treatment: Gait belt;Rolling walker Nurse Communication: Mobility status  Activity Tolerance: Patient tolerated treatment well Patient left: Other (comment) (at sink with PT)  OT Visit Diagnosis: Unsteadiness on feet (R26.81);Muscle weakness (generalized) (M62.81);Other symptoms and signs involving cognitive function                Time: 9485-4627 OT Time Calculation (min): 37 min Charges:  OT General Charges $OT Visit: 1 Visit OT Evaluation $OT Eval Moderate Complexity: 1 Mod OT Treatments $Self Care/Home Management : 8-22 mins  Ladene Artist, OTDS   Ladene Artist 11/18/2020, 9:23 AM

## 2020-11-18 NOTE — Procedures (Signed)
Patient Name: Heather [name]  MRN: [id number]  Epilepsy Attending: Charlsie Quest  Referring Physician/Provider: Jimmye Norman, NP Date: 11/18/2020 Duration: 23.12 mins  Patient history: 80 year old female with COVID 3 weeks ago who was found confused on the floor with nonsensical speech.  EEG to evaluate for seizures.  Level of alertness: Awake  AEDs during EEG study: None  Technical aspects: This EEG study was done with scalp electrodes positioned according to the 10-20 International system of electrode placement. Electrical activity was acquired at a sampling rate of 500Hz  and reviewed with a high frequency filter of 70Hz  and a low frequency filter of 1Hz . EEG data were recorded continuously and digitally stored.   Description: The posterior dominant rhythm consists of 10 Hz activity of moderate voltage (25-35 uV) seen predominantly in posterior head regions, symmetric and reactive to eye opening and eye closing. EEG showed intermittent generalized 3 to 6 Hz theta-delta slowing. Hyperventilation and photic stimulation were not performed.     ABNORMALITY - Intermittent slow, generalized  IMPRESSION: This study is suggestive of mild diffuse encephalopathy, nonspecific etiology. No seizures or epileptiform discharges were seen throughout the recording.  Heather [name] 

## 2020-11-18 NOTE — Progress Notes (Signed)
EEG Completed; Results Pending  

## 2020-11-18 NOTE — Plan of Care (Signed)
  Problem: Education: Goal: Knowledge of General Education information will improve Description: Including pain rating scale, medication(s)/side effects and non-pharmacologic comfort measures Outcome: Progressing   Problem: Health Behavior/Discharge Planning: Goal: Ability to manage health-related needs will improve Outcome: Progressing   Problem: Clinical Measurements: Goal: Ability to maintain clinical measurements within normal limits will improve Outcome: Progressing Goal: Will remain free from infection Outcome: Progressing Goal: Diagnostic test results will improve Outcome: Progressing Goal: Respiratory complications will improve Outcome: Progressing Goal: Cardiovascular complication will be avoided Outcome: Progressing   Problem: Activity: Goal: Risk for activity intolerance will decrease Outcome: Progressing   Problem: Nutrition: Goal: Adequate nutrition will be maintained Outcome: Progressing   Problem: Coping: Goal: Level of anxiety will decrease Outcome: Progressing   Problem: Elimination: Goal: Will not experience complications related to bowel motility Outcome: Progressing Goal: Will not experience complications related to urinary retention Outcome: Progressing   Problem: Pain Managment: Goal: General experience of comfort will improve Outcome: Progressing   Problem: Safety: Goal: Ability to remain free from injury will improve Outcome: Progressing   Problem: Skin Integrity: Goal: Risk for impaired skin integrity will decrease Outcome: Progressing   Problem: Education: Goal: Knowledge of disease or condition will improve Outcome: Progressing Goal: Knowledge of secondary prevention will improve Outcome: Progressing Goal: Knowledge of patient specific risk factors addressed and post discharge goals established will improve Outcome: Progressing   Problem: Health Behavior/Discharge Planning: Goal: Ability to manage health-related needs will  improve Outcome: Progressing   Problem: Self-Care: Goal: Ability to participate in self-care as condition permits will improve Outcome: Progressing   Problem: Ischemic Stroke/TIA Tissue Perfusion: Goal: Complications of ischemic stroke/TIA will be minimized Outcome: Progressing   

## 2020-11-18 NOTE — Evaluation (Signed)
Physical Therapy Evaluation Patient Details Name: Heather [name] MRN: [id number] DOB: 08/24/1940 Today's Date: 11/18/2020   History of Present Illness  Pt is a 80 y/o female who presents with AMS. Daughter found pt initially with pajama top buttoned around her leg, and later face down on the floor with clothing disheveled after an unwitnessed fall. 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 PMH significant for DM, CVA 2021, HTN.   Clinical Impression  Pt admitted with above diagnosis. Pt currently with functional limitations due to the deficits listed below (see PT Problem List). At the time of PT eval pt was able to perform transfers and ambulation with gross min assist and RW for support. Pt moving extremely slow throughout all aspects of evaluation, and required increased time for processing. It appears this patient has had a functional decline, as she reports she was managing fairly well at home PTA. Feel this patient would benefit from a short stay at CIR to maximize functional independence and safety prior to return home with family support. Acutely, pt will benefit from skilled PT to increase their independence and safety with mobility to allow discharge to the venue listed below.       Follow Up Recommendations [facility];Supervision for mobility/OOB    Equipment Recommendations  None recommended by PT    Recommendations for Other Services Rehab consult     Precautions / Restrictions Precautions Precautions: Fall Restrictions Weight Bearing Restrictions: No      Mobility  Bed Mobility Overal bed mobility: Needs Assistance Bed Mobility: Supine to Sit           General bed mobility comments: Pt was received standing at the sink with OT    Transfers Overall transfer level: Needs assistance Equipment used: Rolling walker (2  wheeled) Transfers: Sit to/from Stand Sit to Stand: Min assist         General transfer comment: Assist for lining hips up with the chair when backing up, and multimodal cues to reach back for seated surface.  Ambulation/Gait Ambulation/Gait assistance: Min assist Gait Distance (Feet): 120 Feet Assistive device: Rolling walker (2 wheeled) Gait Pattern/deviations: Step-through pattern;Decreased stride length;Drifts right/left;Trunk flexed;Narrow base of support Gait velocity: Decreased Gait velocity interpretation: <1.31 ft/sec, indicative of household ambulator General Gait Details: Difficulty with turns, stepping on her own feet at times when turning. Pt required frequent assist for RW management, as she demonstrated a heavy R lateral drift which increased as pt fatigued.  Stairs            Wheelchair Mobility    Modified Rankin (Stroke Patients Only) Modified Rankin (Stroke Patients Only) Pre-Morbid Rankin Score: Slight disability Modified Rankin: Moderately severe disability     Balance Overall balance assessment: Needs assistance Sitting-balance support: Feet supported;No upper extremity supported Sitting balance-Leahy Scale: Fair Sitting balance - Comments: Mild posterior lean with LE MMT   Standing balance support: Single extremity supported;During functional activity Standing balance-Leahy Scale: Poor Standing balance comment: Requires at least 1 UE support for standing activity.                             Pertinent Vitals/Pain Pain Assessment: No/denies pain    Home Living Family/patient expects to be discharged to:: Private residence Living Arrangements: Children Available Help at Discharge: Family Type of Home: House Home Access: Level entry  Home Layout: Two level;Able to live on main level with bedroom/bathroom Home Equipment: Dan Humphreys - 2 wheels;Wheelchair - manual Additional Comments: Pt reports she lives with her daughter has a  walker and a wheelchair.    Prior Function Level of Independence: Independent         Comments: pt enjoys walking her dog, going shopping. Pt typically leans on grocery cart for support, but does not use AD at home     Hand Dominance   Dominant Hand: Right    Extremity/Trunk Assessment   Upper Extremity Assessment Upper Extremity Assessment: Defer to OT evaluation    Lower Extremity Assessment Lower Extremity Assessment: Generalized weakness (Grossly equal R and L)    Cervical / Trunk Assessment Cervical / Trunk Assessment: Other exceptions Cervical / Trunk Exceptions: forward head posture with rounded shoulders.  Communication   Communication: Expressive difficulties  Cognition Arousal/Alertness: Awake/alert Behavior During Therapy: WFL for tasks assessed/performed Overall Cognitive Status: Impaired/Different from baseline Area of Impairment: Orientation;Attention;Memory;Following commands;Safety/judgement;Awareness;Problem solving                 Orientation Level: Place Current Attention Level: Sustained Memory: Decreased short-term memory Following Commands: Follows one step commands consistently;Follows one step commands with increased time Safety/Judgement: Decreased awareness of safety;Decreased awareness of deficits Awareness: Emergent Problem Solving: Difficulty sequencing;Requires verbal cues;Slow processing General Comments: Pt with decreased STM, and following one step commands with increased time. Pt with decreased safety awareness and requires increased processing time throughout functional mobility. Confused at times, asking if the brown lead therapist reconnected was a monkey I was putting in her pocket.      General Comments      Exercises     Assessment/Plan    PT Assessment Patient needs continued PT services  PT Problem List Decreased strength;Decreased activity tolerance;Decreased balance;Decreased mobility;Decreased  cognition;Decreased knowledge of use of DME;Decreased safety awareness;Decreased knowledge of precautions       PT Treatment Interventions DME instruction;Gait training;Functional mobility training;Therapeutic activities;Therapeutic exercise;Stair training;Neuromuscular re-education;Patient/family education;Cognitive remediation    PT Goals (Current goals can be found in the Care Plan section)  Acute Rehab PT Goals Patient Stated Goal: "Drink my coffee" - unable to discuss bigger picture with functional deficits and rehab goals. PT Goal Formulation: Patient unable to participate in goal setting Time For Goal Achievement: 12/02/20 Potential to Achieve Goals: Good    Frequency Min 4X/week   Barriers to discharge        Co-evaluation               AM-PAC PT "6 Clicks" Mobility  Outcome Measure Help needed turning from your back to your side while in a flat bed without using bedrails?: A Little Help needed moving from lying on your back to sitting on the side of a flat bed without using bedrails?: A Little Help needed moving to and from a bed to a chair (including a wheelchair)?: A Little Help needed standing up from a chair using your arms (e.g., wheelchair or bedside chair)?: A Little Help needed to walk in hospital room?: A Little Help needed climbing 3-5 steps with a railing? : A Little 6 Click Score: 18    End of Session Equipment Utilized During Treatment: Gait belt Activity Tolerance: Patient limited by fatigue Patient left: in chair;with call bell/phone within reach;with chair alarm set Nurse Communication: Mobility status PT Visit Diagnosis: Unsteadiness on feet (R26.81)    Time: 6203-5597 PT Time Calculation (min) (ACUTE ONLY): 33 min   Charges:  PT Evaluation $PT Eval Moderate Complexity: 1 Mod PT Treatments $Gait Training: 8-22 mins        Conni Slipper, PT, DPT Acute Rehabilitation Services Pager: [phone] Office: [phone]   Marylynn Pearson 11/18/2020, 9:16 AM

## 2020-11-18 NOTE — Progress Notes (Signed)
Inpatient Rehabilitation Admissions Coordinator   Inpatient rehab consult received. Family friend at bedside. She states daughter will arrive this afternoon. I will return to speak with patient and daughter concerning rehab venue options.  Ottie Glazier, RN, MSN Rehab Admissions Coordinator [phone] 11/18/2020 12:53 PM

## 2020-11-18 NOTE — Progress Notes (Addendum)
PROGRESS NOTE    Heather [name]  HRC:[id number] DOB: [birth year] DOA: 11/16/2020 PCP: Caesar Bookman, NP    Brief Narrative:  Heather [name] is a 80 year old female with past medical history significant for essential pretension, hyperlipidemia, history of ischemic CVA 2021, DM2 who presented initially to [facility] ED on 7/9 after fall and speech/cognitive issues over the last 3 weeks.  Daughter who is present at bedside contributed to the HPI.  Daughter states her mother has not been acting right after being diagnosed with COVID-19 viral infection 3 weeks prior.  She would say things that did not make sense; and apparently had a shirt buttons around her leg last night.  Daughter found patient in her closet, on the floor with her short half off and her pants were down around her knees, unclear if syncopal episode.  Did not notice any weakness or facial droop.  Daughter reports she has had some memory loss issues since her ischemic stroke last year.  Patient had reported increased urinary urgency/frequency but no dysuria.  Patient denied headache, no visual changes; but also noted that she has been weak on her left side since her previous ischemic stroke.  In the ED, temperature 98.0 F, HR 70, RR 18, BP 215/94, SPO2 98% on room air.  Sodium 138, potassium 3.7, chloride 100, CO2 29, BUN 16, creatinine 1.43, AST 43, ALT 36, glucose 149.  WBC 7.4, hemoglobin 15.2, platelets 244.  High-sensitivity troponin 13 > 13.  EKG with NSR, rate 59, QTc 440, right bundle branch block, T wave inversion isolated V3, similar in appearance to EKG dated 01/18/2020.  COVID-19/SARS-CoV-2 negative.  Urinalysis with large leukocytes, negative nitrite, many bacteria, greater than 50 WBCs.  CT head without contrast with no acute intracranial pathology, notable advanced small vessel white matter disease with encephalomalacia of the right caudate and adjacent corona radiata.  MR brain with acute infarct juxtacortical right frontal lobe; with  possible subacute infarcts as well.  Neurology was consulted.  TRH consulted for further evaluation and management of acute CVA and urinary tract infection.  Patient transferred from [place] to [facility] for stroke team evaluation.   Assessment & Plan:   Principal Problem:   Acute CVA (cerebrovascular accident) (HCC) Active Problems:   Essential hypertension   Hyperlipidemia LDL goal <100   Type 2 diabetes mellitus without complication, without long-term current use of insulin (HCC)   Urinary tract infection   CKD (chronic kidney disease), stage III (HCC)   Acute metabolic encephalopathy, POA Family reports patient has been acting more confused over the last 3 weeks, after recent diagnosis of COVID-19 viral infection.  Also reported patient with memory issues since previous ischemic CVA 2021.  Patient was found down at home with reports of her shirt buttoned onto her leg.  Ammonia level within normal limits, patient afebrile without leukocytosis.  TSH 2.553, within normal limits.  B12 slightly low at 391.  Head imaging with acute and subacute infarcts as well as urinalysis suggestive of a urinary tract infection. --Continue treatment as below --Supportive care  Acute ischemic CVA Patient presenting to [place] [facility] ED after being found down at home by family.  Per family report, patient has not been acting right with worsening confusion over the last 3 weeks; following recent diagnosis of COVID-19 viral infection.  CT head without contrast with no acute intracranial findings but notable for advanced small vessel white matter disease and encephalomalacia of the right caudate/corona radiata consistent with prior CVA.  MR brain with acute infarct just a cortical right frontal lobe with subacute infarct left corpus callosum/occipital lobe.  MRA head/neck with advanced intracranial atherosclerosis with multifocal high-grade narrowing which is progressed from 2021 and chronically diminished flow left  vertebral artery due to none resolved proximal stenosis.  Hemoglobin A1c 6.6, LDL 167.  TTE with LVEF 60-75% grade 1 diastolic dysfunction. --Neurology following, appreciate assistance --Restarted metoprolol tartrate 25 mg p.o. twice daily and reduced dose losartan 50 mg p.o. daily after allowing permissive hypertension for 48 hours --DAPT w/ Plavix 75mg  daily and aspirin 81 mg daily x 21 days followed by aspirin alone --Increased atorvastatin to 80 mg p.o. daily --Pending OT/SLP evaluation --PT recommending CIR --Continue monitor from telemetry --Anticipate need for SNF placement, TOC evaluation  E. coli urinary tract infection Patient reported increased urinary frequency/urgency without dysuria.  Urinalysis with large leukoesterase, negative nitrite, many bacteria, greater than 50 WBCs.  Patient is afebrile without leukocytosis.  This could also be a contributing factor to her worsening confusion over the last 3 weeks. --Urine culture: >100K Ecoli, susceptibilities pending --Continue ceftriaxone 1 gram IV every 24 hours  Elevated AST AST slightly available at 43 on admission.  Unclear etiology, possible mild acute liver injury from unknown downtime at home.  Given need for increased statin dose for CVA, will trend LFTs. --AST 43>104 --ALT 36>80 --Tbili0.6 --Repeat CMP in a.m.  Hypokalemia Potassium 3.4, magnesium 2.2 this morning, will replete potassium. --Repeat electrolytes in the a.m.  Essential hypertension Home regimen includes losartan 100 mg p.o. daily, metoprolol tartrate 25 mg p.o. twice daily. -- Restarting home metoprolol 25 mg p.o. twice daily and reduced dose losartan 50 mg p.o. daily after allowing permissive hypertension over the last 48 hours --Hydralazine 25 mg p.o. q6h prn SBP >220 or DBP >110  --Labetalol as needed  Hyperlipidemia Lipid panel with total cholesterol 227, HDL 36, LDL 167.  Goal HDL less than 70 with acute CVA. --Atorvastatin increased from 40 mg  to 80 mg p.o. daily  Type 2 diabetes mellitus Hemoglobin A1c 6.6, well controlled.  On metformin 500 mg p.o. daily at home. --Hold oral hypoglycemics while inpatient --SSI for coverage --CBG before every meal/at bedtime  Depression --Continue sertraline 25 mg p.o. daily   DVT prophylaxis: enoxaparin (LOVENOX) injection 30 mg Start: 11/16/20 2200   Code Status: DNR Family Communication: Updated family members present at bedside  Disposition Plan:  Level of care: Telemetry Medical Status is: Inpatient  Remains inpatient appropriate because:Altered mental status, Ongoing diagnostic testing needed not appropriate for outpatient work up, Unsafe d/c plan, IV treatments appropriate due to intensity of illness or inability to take PO, and Inpatient level of care appropriate due to severity of illness  Dispo: The patient is from: Home              Anticipated d/c is to: SNF              Patient currently is not medically stable to d/c.   Difficult to place patient No   Consultants:  Neurology  Procedures:  TTE EEG: Pending  Antimicrobials:  Ceftriaxone 7/9>>   Subjective: Patient seen examined bedside, currently working with therapy utilizing walker going to the bathroom.  States her weakness improved as well as her speech.  Pending EEG.  No other specific complaints or concerns at this time.  Denies headache, no visual changes, no chest pain, palpitations, no shortness of breath, no abdominal pain, no fever/chills/night sweats, no nausea/vomiting/diarrhea, no paresthesias.  No acute events overnight per nursing staff.  Objective: Vitals:   11/17/20 2352 11/18/20 0216 11/18/20 0438 11/18/20 0901  BP: (!) 210/88 (!) 186/86 (!) 198/65 (!) 177/65  Pulse: 94 95 72 84  Resp: 16 18 18 18   Temp: 98.8 F (37.1 C) 98.4 F (36.9 C) 98 F (36.7 C) 97.6 F (36.4 C)  TempSrc: Oral Oral Oral Oral  SpO2: 100% 94% 96% 97%  Weight:      Height:        Intake/Output Summary (Last 24  hours) at 11/18/2020 1013 Last data filed at 11/18/2020 0819 Gross per 24 hour  Intake 247 ml  Output 700 ml  Net -453 ml   Filed Weights   11/16/20 1208 11/17/20 1819  Weight: 61.7 kg 61.7 kg    Examination:  General exam: Appears calm and comfortable  Respiratory system: Clear to auscultation. Respiratory effort normal.  On room air Cardiovascular system: S1 & S2 heard, RRR. No JVD, murmurs, rubs, gallops or clicks. No pedal edema. Gastrointestinal system: Abdomen is nondistended, soft and nontender. No organomegaly or masses felt. Normal bowel sounds heard. Central nervous system: Alert and oriented.  Some difficulty with word finding and mild left lower extremity weakness otherwise no focal neurological deficits Extremities: Left lower extremity muscle strength 4+/5, otherwise strength to bilateral upper extremities and right lower extremity preserved 5/5. Skin: No rashes, lesions or ulcers Psychiatry: Judgement and insight appear slightly diminished. Mood & affect appropriate.     Data Reviewed: I have personally reviewed following labs and imaging studies  CBC: Recent Labs  Lab 11/16/20 1349  WBC 7.4  NEUTROABS 5.9  HGB 15.2*  HCT 45.9  MCV 93.7  PLT 244   Basic Metabolic Panel: Recent Labs  Lab 11/16/20 1349 11/17/20 0527 11/18/20 0402  NA 138 137 137  K 3.7 3.5 3.4*  CL 100 101 103  CO2 29 27 24   GLUCOSE 149* 141* 160*  BUN 16 18 24*  CREATININE 1.43* 1.41* 1.60*  CALCIUM 9.7 9.3 9.0  MG  --   --  2.2   GFR: Estimated Creatinine Clearance: 23.8 mL/min (A) (by C-G formula based on SCr of 1.6 mg/dL (H)). Liver Function Tests: Recent Labs  Lab 11/16/20 1349 11/18/20 0402  AST 43* 104*  ALT 36 80*  ALKPHOS 158* 187*  BILITOT 0.9 0.6  PROT 7.7 5.7*  ALBUMIN 4.1 2.8*   No results for input(s): LIPASE, AMYLASE in the last 168 hours. Recent Labs  Lab 11/17/20 0525  AMMONIA 10   Coagulation Profile: Recent Labs  Lab 11/16/20 1349  INR 0.9    Cardiac Enzymes: No results for input(s): CKTOTAL, CKMB, CKMBINDEX, TROPONINI in the last 168 hours. BNP (last 3 results) No results for input(s): PROBNP in the last 8760 hours. HbA1C: Recent Labs    11/17/20 0525  HGBA1C 6.6*   CBG: Recent Labs  Lab 11/17/20 0919 11/17/20 1318 11/17/20 1733 11/17/20 2120 11/18/20 0636  GLUCAP 113* 181* 108* 176* 161*   Lipid Profile: Recent Labs    11/17/20 0525  CHOL 227*  HDL 36*  LDLCALC 167*  TRIG 120  CHOLHDL 6.3   Thyroid Function Tests: Recent Labs    11/17/20 0526  TSH 2.553   Anemia Panel: Recent Labs    11/17/20 0526  VITAMINB12 391   Sepsis Labs: No results for input(s): PROCALCITON, LATICACIDVEN in the last 168 hours.  Recent Results (from the past 240 hour(s))  Resp Panel by RT-PCR (Flu A&B, Covid) Nasopharyngeal Swab  Status: None   Collection Time: 11/16/20  1:54 PM   Specimen: Nasopharyngeal Swab; Nasopharyngeal(NP) swabs in vial transport medium  Result Value Ref Range Status   SARS Coronavirus 2 by RT PCR NEGATIVE NEGATIVE Final    Comment: (NOTE) SARS-CoV-2 target nucleic acids are NOT DETECTED.  The SARS-CoV-2 RNA is generally detectable in upper respiratory specimens during the acute phase of infection. The lowest concentration of SARS-CoV-2 viral copies this assay can detect is 138 copies/mL. A negative result does not preclude SARS-Cov-2 infection and should not be used as the sole basis for treatment or other patient management decisions. A negative result may occur with  improper specimen collection/handling, submission of specimen other than nasopharyngeal swab, presence of viral mutation(s) within the areas targeted by this assay, and inadequate number of viral copies(<138 copies/mL). A negative result must be combined with clinical observations, patient history, and epidemiological information. The expected result is Negative.  Fact Sheet for Patients:   BloggerCourse.com  Fact Sheet for Healthcare Providers:  SeriousBroker.it  This test is no t yet approved or cleared by the [place] FDA and  has been authorized for detection and/or diagnosis of SARS-CoV-2 by FDA under an Emergency Use Authorization (EUA). This EUA will remain  in effect (meaning this test can be used) for the duration of the COVID-19 declaration under Section 564(b)(1) of the Act, 21 U.S.C.section 360bbb-3(b)(1), unless the authorization is terminated  or revoked sooner.       Influenza A by PCR NEGATIVE NEGATIVE Final   Influenza B by PCR NEGATIVE NEGATIVE Final    Comment: (NOTE) The Xpert Xpress SARS-CoV-2/FLU/RSV plus assay is intended as an aid in the diagnosis of influenza from Nasopharyngeal swab specimens and should not be used as a sole basis for treatment. Nasal washings and aspirates are unacceptable for Xpert Xpress SARS-CoV-2/FLU/RSV testing.  Fact Sheet for Patients: BloggerCourse.com  Fact Sheet for Healthcare Providers: SeriousBroker.it  This test is not yet approved or cleared by the [place] FDA and has been authorized for detection and/or diagnosis of SARS-CoV-2 by FDA under an Emergency Use Authorization (EUA). This EUA will remain in effect (meaning this test can be used) for the duration of the COVID-19 declaration under Section 564(b)(1) of the Act, 21 U.S.C. section 360bbb-3(b)(1), unless the authorization is terminated or revoked.  Performed at [facility], [address], [place], [place] [address]   Urine culture     Status: Abnormal (Preliminary result)   Collection Time: 11/16/20  2:40 PM   Specimen: Urine, Clean Catch  Result Value Ref Range Status   Specimen Description   Final    URINE, CLEAN CATCH Performed at [facility], [address] [place], [place] [address]     Special Requests   Final    NONE Performed at [facility], [address], [place], [place] [address]    Culture >=100,000 COLONIES/mL ESCHERICHIA COLI (A)  Final   Report Status PENDING  Incomplete         Radiology Studies: CT Head Wo Contrast  Result Date: 11/16/2020 CLINICAL DATA:  Fall, altered mental status, confusion, headache EXAM: CT HEAD WITHOUT CONTRAST TECHNIQUE: Contiguous axial images were obtained from the base of the skull through the vertex without intravenous contrast. COMPARISON:  01/17/2020 FINDINGS: Brain: No evidence of acute infarction, hemorrhage, hydrocephalus, extra-axial collection or mass lesion/mass effect. Extensive periventricular and deep white matter hypodensity. Encephalomalacia of the right caudate and adjacent corona radiata (series 2, image  15). Vascular: No hyperdense vessel or unexpected calcification. Skull: Normal. Negative for fracture or focal lesion. Sinuses/Orbits: No acute finding. Other: None. IMPRESSION: 1. No acute intracranial pathology. 2. Advanced small-vessel white matter disease. Encephalomalacia of the right caudate and adjacent corona radiata, in keeping with prior infarction. Electronically Signed   By: Lauralyn Primes M.D.   On: 11/16/2020 14:23   MR ANGIO HEAD WO CONTRAST  Result Date: 11/17/2020 CLINICAL DATA:  Stroke follow-up EXAM: MRA HEAD WITHOUT CONTRAST TECHNIQUE: Angiographic images of the Circle of Willis were acquired using MRA technique without intravenous contrast. COMPARISON:  01/17/2020 FINDINGS: Anterior circulation: The carotid siphons are diffusely patent. Aplastic or hypoplastic left A1 segment. Atheromatous irregularity of bilateral MCA branches which is prominent. Chronic high-grade apearing left ICA terminus stenosis. Moderate to advanced left M2 branch stenosis. Posterior circulation: Chronic occlusion of the left vertebral artery with faint distal V4 segment flow which could be retrograde. There is  multifocal atheromatous irregularity of the right vertebral and basilar artery with tandem high-grade basilar stenosis. Proximal basilar narrowing has progressed. There is a small basilar in the setting of fetal type bilateral PCA flow. Moderate bilateral PCA atheromatous irregularity and narrowing which appears progressed Anatomic variants: As above IMPRESSION: Advanced intracranial atherosclerosis with multifocal high-grade narrowing that is progressed from 2021. Electronically Signed   By: Marnee Spring M.D.   On: 11/17/2020 08:43   MR ANGIO NECK WO CONTRAST  Result Date: 11/17/2020 CLINICAL DATA:  Stroke follow-up EXAM: MRA NECK WITHOUT CONTRAST TECHNIQUE: Angiographic images of the neck were acquired using MRA technique without intravenous contrast. Carotid stenosis measurements (when applicable) are obtained utilizing NASCET criteria, using the distal internal carotid diameter as the denominator. COMPARISON:  01/17/2020 FINDINGS: Aortic arch: Limited coverage is negative. Right carotid system: No stenosis or beading seen. Left carotid system: No stenosis or beading seen. Vertebral arteries: Covered proximal subclavian arteries are patent but significantly motion degraded evaluation. Codominant vertebral arteries are patent to the dura. The left vertebral shows less intense flow by 3D time-of-flight, but is still patent in the neck and actually improved from prior IMPRESSION: 1. No emergent finding or change from 2021. 2. Chronically diminished flow in the left vertebral artery likely due to non resolved proximal stenosis. Electronically Signed   By: Marnee Spring M.D.   On: 11/17/2020 09:05   MR BRAIN W WO CONTRAST  Result Date: 11/16/2020 EXAM: MRI HEAD WITHOUT AND WITH CONTRAST TECHNIQUE: Multiplanar, multiecho pulse sequences of the brain and surrounding structures were obtained without and with intravenous contrast. CONTRAST:  6mL GADAVIST GADOBUTROL 1 MMOL/ML IV SOLN COMPARISON:  Same day CT  head and MRI from 01/17/2020. FINDINGS: Motion limited evaluation.  Within this limitation: Brain: Restricted diffusion in the juxtacortical right frontal lobe without enhancement, compatible with acute infarct. Additional milder DWI hyperintensity with milder restricted diffusion in the left aspect of the splenium of the corpus callosum and left occipital lobe. Evolution of the previously seen right basal ganglia and corona radiata infarcts with encephalomalacia in these areas. There is extensive patchy and confluent additional T2/FLAIR hyperintensities within the supratentorial and pontine white matter, most likely related to chronic microvascular ischemic disease. Small remote infarct in the left frontal white matter. No hydrocephalus. Wallerian degeneration of the right midbrain. No evidence of acute hemorrhage. No extra-axial fluid collection. No abnormal enhancement. 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 The other major arterial flow voids are maintained at  the skull base. Skull and upper cervical spine: Normal marrow signal. Sinuses/Orbits: Visualized sinuses are clear. No acute orbital findings. Other: Small left mastoid effusion. IMPRESSION: 1. Acute infarct in the juxtacortical right frontal lobe. Additional areas of milder restricted diffusion in the left aspect of the splenium of the corpus callosum and the left occipital lobe may represent subacute infarcts. 2. Evolution of the previously seen right basal ganglia and corona radiata infarcts with encephalomalacia in these areas. The 3. Severe chronic microvascular ischemic disease. 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 Electronically Signed   By: Feliberto HartsFrederick S Jones MD   On: 11/16/2020 16:32   ECHOCARDIOGRAM COMPLETE  Result Date: 11/17/2020    ECHOCARDIOGRAM REPORT   Patient Name:   [facility]UTH [facility] Date of Exam: 11/17/2020 Medical Rec #:  [id number]      Height:       60.0 in Accession #:    [id number]     Weight:       136.0 lb Date of Birth:  [birth year]      BSA:          1.584 m Patient Age:    79 years       BP:           147/104 mmHg Patient Gender: F              HR:           63 bpm. Exam Location:  Inpatient Procedure: 2D Echo, Cardiac Doppler and Color Doppler Indications:    CVA  History:        Patient has prior history of Echocardiogram examinations, most                 recent 01/18/2020. Stroke; Risk Factors:Diabetes, Dyslipidemia and                 Hypertension.  Sonographer:    Neomia DearAMARA CROWN RDCS Referring Phys: [id number] ALLISON WOLFE IMPRESSIONS  1. Left ventricular ejection fraction, by estimation, is 70 to 75%. The left ventricle has hyperdynamic function. The left ventricle has no regional wall motion abnormalities. Left ventricular diastolic parameters are consistent with Grade I diastolic dysfunction (impaired relaxation).  2. Right ventricular systolic function is normal. The right ventricular size is normal. There is normal pulmonary artery systolic pressure.  3. The mitral valve is normal in structure. Trivial mitral valve regurgitation.  4. The aortic valve is grossly normal. Aortic valve regurgitation is not visualized. No aortic stenosis is present. FINDINGS  Left Ventricle: Left ventricular ejection fraction, by estimation, is 70 to 75%. The left ventricle has hyperdynamic function. The left ventricle has no regional wall motion abnormalities. The left ventricular internal cavity size was small. There is no  left ventricular hypertrophy. Left ventricular diastolic parameters are consistent with Grade I diastolic dysfunction (impaired relaxation). Right Ventricle: The right ventricular size is normal. Right vetricular wall thickness was not well visualized. Right ventricular systolic function is normal. There is normal pulmonary artery systolic pressure. The tricuspid regurgitant  velocity is 2.59 m/s, and with an assumed right atrial pressure of 3 mmHg, the estimated right ventricular systolic pressure is 29.8 mmHg. Left Atrium: Left atrial size was normal in size. Right Atrium: Right atrial size was normal in size. Pericardium: There is no evidence of pericardial effusion. Mitral Valve: The mitral valve is normal in structure. Trivial mitral valve regurgitation. Tricuspid Valve: The tricuspid valve is  grossly normal. Tricuspid valve regurgitation is mild. Aortic Valve: The aortic valve is grossly normal. Aortic valve regurgitation is not visualized. No aortic stenosis is present. Aortic valve mean gradient measures 3.0 mmHg. Aortic valve peak gradient measures 4.5 mmHg. Aortic valve area, by VTI measures 3.26 cm. Pulmonic Valve: The pulmonic valve was grossly normal. Pulmonic valve regurgitation is not visualized. Aorta: The aortic root and ascending aorta are structurally normal, with no evidence of dilitation. IAS/Shunts: The interatrial septum was not well visualized.  LEFT VENTRICLE PLAX 2D LVIDd:         5.00 cm     Diastology LVIDs:         2.10 cm     LV e' medial:    3.15 cm/s LV PW:         1.00 cm     LV E/e' medial:  19.5 LV IVS:        0.90 cm     LV e' lateral:   5.22 cm/s LVOT diam:     2.20 cm     LV E/e' lateral: 11.8 LV SV:         80 LV SV Index:   51 LVOT Area:     3.80 cm  LV Volumes (MOD) LV vol d, MOD A2C: 17.1 ml LV vol d, MOD A4C: 47.5 ml LV vol s, MOD A2C: 8.8 ml LV vol s, MOD A4C: 15.6 ml LV SV MOD A2C:     8.3 ml LV SV MOD A4C:     47.5 ml LV SV MOD BP:      19.1 ml RIGHT VENTRICLE RV S prime:     8.38 cm/s TAPSE (M-mode): 2.4 cm LEFT ATRIUM             Index LA Vol (A2C):   11.9 ml 7.51 ml/m LA Vol (A4C):   43.1 ml 27.20 ml/m LA Biplane Vol: 24.5 ml 15.46 ml/m  AORTIC VALVE                   PULMONIC VALVE AV Area (Vmax):    2.86 cm    PV Vmax:       0.92 m/s AV Area (Vmean):   2.87 cm    PV Vmean:      61.100 cm/s AV Area (VTI):     3.26 cm    PV VTI:         0.213 m AV Vmax:           106.00 cm/s PV Peak grad:  3.4 mmHg AV Vmean:          74.200 cm/s PV Mean grad:  2.0 mmHg AV VTI:            0.246 m AV Peak Grad:      4.5 mmHg AV Mean Grad:      3.0 mmHg LVOT Vmax:         79.80 cm/s LVOT Vmean:        56.000 cm/s LVOT VTI:          0.211 m LVOT/AV VTI ratio: 0.86  AORTA Ao Root diam: 2.70 cm Ao Asc diam:  2.90 cm MITRAL VALVE               TRICUSPID VALVE MV Area (PHT): 2.55 cm    TR Peak grad:   26.8 mmHg MV Decel Time: 298 msec    TR Vmax:        259.00 cm/s MV E velocity:  61.40 cm/s MV A velocity: 86.70 cm/s  SHUNTS MV E/A ratio:  0.71        Systemic VTI:  0.21 m                            Systemic Diam: 2.20 cm Kristeen Miss MD Electronically signed by Kristeen Miss MD Signature Date/Time: 11/17/2020/12:31:16 PM    Final         Scheduled Meds:   stroke: mapping our early stages of recovery book   Does not apply Once   aspirin EC  81 mg Oral Daily   clopidogrel  75 mg Oral Daily   cyanocobalamin  1,000 mcg Intramuscular Daily   enoxaparin (LOVENOX) injection  30 mg Subcutaneous Q24H   insulin aspart  0-9 Units Subcutaneous TID WC   losartan  50 mg Oral Daily   metoprolol tartrate  25 mg Oral BID   sertraline  25 mg Oral Daily   Continuous Infusions:  cefTRIAXone (ROCEPHIN)  IV 1 g (11/17/20 1522)     LOS: 1 day    Time spent: 39 minutes spent on chart review, discussion with nursing staff, consultants, updating family and interview/physical exam; more than 50% of that time was spent in counseling and/or coordination of care.    Alvira Philips [place], DO [facility] Available via Epic secure chat 7am-7pm After these hours, please refer to coverage provider listed on [email or web address] 11/18/2020, 10:13 AM

## 2020-11-18 NOTE — Plan of Care (Signed)
  Problem: Education: Goal: Knowledge of disease or condition will improve Outcome: Progressing Goal: Knowledge of secondary prevention will improve Outcome: Progressing Goal: Knowledge of patient specific risk factors addressed and post discharge goals established will improve Outcome: Progressing   Problem: Health Behavior/Discharge Planning: Goal: Ability to manage health-related needs will improve Outcome: Progressing   Problem: Self-Care: Goal: Ability to participate in self-care as condition permits will improve Outcome: Progressing   Problem: Ischemic Stroke/TIA Tissue Perfusion: Goal: Complications of ischemic stroke/TIA will be minimized Outcome: Progressing   

## 2020-11-18 NOTE — Progress Notes (Addendum)
STROKE TEAM PROGRESS NOTE   ATTENDING NOTE: I reviewed above note and agree with the assessment and plan. Pt was seen and examined.   80 year old female with history of diabetes, hypertension, CKD, hyperlipidemia, COVID 3 weeks ago and stroke in 01/2020 admitted for confusion and speech difficulty with fall.  She had a stroke in 01/2020 with confusion, gait imbalance, left-sided weakness.  CT and MRI showed right BG and left IC lacunar infarcts.  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  EF 65 to 70%, A1c 6.4, LDL 239.  She was discharged with DAPT as well as Crestor 40.  Recommend 30-day cardiac event monitoring as outpatient.  However, it was not done apparently.  On this admission CT no acute abnormality, but old right BG infarct.  MRI showed acute small right frontal, left splenium and occipital small subacute infarcts.  MRI showed left ICA siphon high-grade stenosis, left M2 moderate stenosis, left VA occlusion, tandem high-grade stenosis at the basilar artery.  The intracranial stenosis seems progressed from last admission 01/2020.  EF 70 to 75%, A1c 6.6, LDL 167 and patient admitted that she sometimes forgot taking statin medication.  UA WBC > 50, urine culture showed E. coli.  Creatinine 1.41->1.60.  EEG mild diffuse encephalopathy, no seizure.  Etiology for patient stroke not quite clear, synchronized small / large vessel disease versus cardioembolic source.  Still recommend 30-day cardiac event monitoring to rule out A. fib.  Continue aspirin 325 and Plavix 75 DAPT for 3 months and then aspirin alone given severe intracranial stenosis.  Resume Lipitor 80 once AST and ALT normalized.  Currently AST/ALT 104/80.  Currently on Rocephin for UTI treatment per primary team.  For detailed assessment and plan, please refer to above as I have made changes wherever appropriate.   Neurology will sign off. Please call with questions. Pt will follow up  with stroke clinic NP at [facility] on 12/26/2020 as scheduled. Thanks for the consult.   Marvel Plan, MD PhD Stroke Neurology 11/18/2020 7:36 PM   INTERVAL HISTORY Her family friend is at the bedside.  Pt is pleasantly confused and does not remember her previous stroke when we discuss stroke risks factors. Per family friend, she often forgets her meds despite her daughter diligently helping her set them up. Her daughter must work and cannot provide 24/7 supervision and unable to live with pt. Case Mgt assisting with d/c plan.   Vitals:   11/17/20 2032 11/17/20 2352 11/18/20 0216 11/18/20 0438  BP: (!) 179/68 (!) 210/88 (!) 186/86 (!) 198/65  Pulse: 76 94 95 72  Resp: 18 16 18 18   Temp: 98.6 F (37 C) 98.8 F (37.1 C) 98.4 F (36.9 C) 98 F (36.7 C)  TempSrc: Oral Oral Oral Oral  SpO2: 97% 100% 94% 96%  Weight:      Height:       CBC:  Recent Labs  Lab 11/16/20 1349  WBC 7.4  NEUTROABS 5.9  HGB 15.2*  HCT 45.9  MCV 93.7  PLT 244   Basic Metabolic Panel:  Recent Labs  Lab 11/17/20 0527 11/18/20 0402  NA 137 137  K 3.5 3.4*  CL 101 103  CO2 27 24  GLUCOSE 141* 160*  BUN 18 24*  CREATININE 1.41* 1.60*  CALCIUM 9.3 9.0  MG  --  2.2   Lipid Panel:  Recent Labs  Lab 11/17/20 0525  CHOL 227*  TRIG 120  HDL 36*  CHOLHDL  6.3  VLDL 24  LDLCALC 167*   HgbA1c:  Recent Labs  Lab 11/17/20 0525  HGBA1C 6.6*   Urine Drug Screen: No results for input(s): LABOPIA, COCAINSCRNUR, LABBENZ, AMPHETMU, THCU, LABBARB in the last 168 hours.  Alcohol Level No results for input(s): ETH in the last 168 hours.  IMAGING past 24 hours MR ANGIO NECK WO CONTRAST  Result Date: 11/17/2020 CLINICAL DATA:  Stroke follow-up EXAM: MRA NECK WITHOUT CONTRAST TECHNIQUE: Angiographic images of the neck were acquired using MRA technique without intravenous contrast. Carotid stenosis measurements (when applicable) are obtained utilizing NASCET criteria, using the distal internal carotid diameter  as the denominator. COMPARISON:  01/17/2020 FINDINGS: Aortic arch: Limited coverage is negative. Right carotid system: No stenosis or beading seen. Left carotid system: No stenosis or beading seen. Vertebral arteries: Covered proximal subclavian arteries are patent but significantly motion degraded evaluation. Codominant vertebral arteries are patent to the dura. The left vertebral shows less intense flow by 3D time-of-flight, but is still patent in the neck and actually improved from prior IMPRESSION: 1. No emergent finding or change from 2021. 2. Chronically diminished flow in the left vertebral artery likely due to non resolved proximal stenosis. Electronically Signed   By: Marnee Spring M.D.   On: 11/17/2020 09:05   ECHOCARDIOGRAM COMPLETE  Result Date: 11/17/2020    ECHOCARDIOGRAM REPORT   Patient Name:   Heather [name] [facility] Date of Exam: 11/17/2020 Medical Rec #:  [id number]      Height:       60.0 in Accession #:    [id number]     Weight:       136.0 lb Date of Birth:  [birth year]      BSA:          1.584 m Patient Age:    23 years       BP:           147/104 mmHg Patient Gender: F              HR:           63 bpm. Exam Location:  Inpatient Procedure: 2D Echo, Cardiac Doppler and Color Doppler Indications:    CVA  History:        Patient has prior history of Echocardiogram examinations, most                 recent 01/18/2020. Stroke; Risk Factors:Diabetes, Dyslipidemia and                 Hypertension.  Sonographer:    Neomia Dear RDCS Referring Phys: [id number] ALLISON WOLFE IMPRESSIONS  1. Left ventricular ejection fraction, by estimation, is 70 to 75%. The left ventricle has hyperdynamic function. The left ventricle has no regional wall motion abnormalities. Left ventricular diastolic parameters are consistent with Grade I diastolic dysfunction (impaired relaxation).  2. Right ventricular systolic function is normal. The right ventricular size is normal. There is normal pulmonary artery systolic pressure.  3.  The mitral valve is normal in structure. Trivial mitral valve regurgitation.  4. The aortic valve is grossly normal. Aortic valve regurgitation is not visualized. No aortic stenosis is present. FINDINGS  Left Ventricle: Left ventricular ejection fraction, by estimation, is 70 to 75%. The left ventricle has hyperdynamic function. The left ventricle has no regional wall motion abnormalities. The left ventricular internal cavity size was small. There is no  left ventricular hypertrophy. Left ventricular diastolic parameters are consistent with Grade I diastolic dysfunction (impaired relaxation). Right  Ventricle: The right ventricular size is normal. Right vetricular wall thickness was not well visualized. Right ventricular systolic function is normal. There is normal pulmonary artery systolic pressure. The tricuspid regurgitant velocity is 2.59 m/s, and with an assumed right atrial pressure of 3 mmHg, the estimated right ventricular systolic pressure is 29.8 mmHg. Left Atrium: Left atrial size was normal in size. Right Atrium: Right atrial size was normal in size. Pericardium: There is no evidence of pericardial effusion. Mitral Valve: The mitral valve is normal in structure. Trivial mitral valve regurgitation. Tricuspid Valve: The tricuspid valve is grossly normal. Tricuspid valve regurgitation is mild. Aortic Valve: The aortic valve is grossly normal. Aortic valve regurgitation is not visualized. No aortic stenosis is present. Aortic valve mean gradient measures 3.0 mmHg. Aortic valve peak gradient measures 4.5 mmHg. Aortic valve area, by VTI measures 3.26 cm. Pulmonic Valve: The pulmonic valve was grossly normal. Pulmonic valve regurgitation is not visualized. Aorta: The aortic root and ascending aorta are structurally normal, with no evidence of dilitation. IAS/Shunts: The interatrial septum was not well visualized.  LEFT VENTRICLE PLAX 2D LVIDd:         5.00 cm     Diastology LVIDs:         2.10 cm     LV e'  medial:    3.15 cm/s LV PW:         1.00 cm     LV E/e' medial:  19.5 LV IVS:        0.90 cm     LV e' lateral:   5.22 cm/s LVOT diam:     2.20 cm     LV E/e' lateral: 11.8 LV SV:         80 LV SV Index:   51 LVOT Area:     3.80 cm  LV Volumes (MOD) LV vol d, MOD A2C: 17.1 ml LV vol d, MOD A4C: 47.5 ml LV vol s, MOD A2C: 8.8 ml LV vol s, MOD A4C: 15.6 ml LV SV MOD A2C:     8.3 ml LV SV MOD A4C:     47.5 ml LV SV MOD BP:      19.1 ml RIGHT VENTRICLE RV S prime:     8.38 cm/s TAPSE (M-mode): 2.4 cm LEFT ATRIUM             Index LA Vol (A2C):   11.9 ml 7.51 ml/m LA Vol (A4C):   43.1 ml 27.20 ml/m LA Biplane Vol: 24.5 ml 15.46 ml/m  AORTIC VALVE                   PULMONIC VALVE AV Area (Vmax):    2.86 cm    PV Vmax:       0.92 m/s AV Area (Vmean):   2.87 cm    PV Vmean:      61.100 cm/s AV Area (VTI):     3.26 cm    PV VTI:        0.213 m AV Vmax:           106.00 cm/s PV Peak grad:  3.4 mmHg AV Vmean:          74.200 cm/s PV Mean grad:  2.0 mmHg AV VTI:            0.246 m AV Peak Grad:      4.5 mmHg AV Mean Grad:      3.0 mmHg LVOT Vmax:  79.80 cm/s LVOT Vmean:        56.000 cm/s LVOT VTI:          0.211 m LVOT/AV VTI ratio: 0.86  AORTA Ao Root diam: 2.70 cm Ao Asc diam:  2.90 cm MITRAL VALVE               TRICUSPID VALVE MV Area (PHT): 2.55 cm    TR Peak grad:   26.8 mmHg MV Decel Time: 298 msec    TR Vmax:        259.00 cm/s MV E velocity: 61.40 cm/s MV A velocity: 86.70 cm/s  SHUNTS MV E/A ratio:  0.71        Systemic VTI:  0.21 m                            Systemic Diam: 2.20 cm Kristeen Miss MD Electronically signed by Kristeen Miss MD Signature Date/Time: 11/17/2020/12:31:16 PM    Final     EEG- Intermittent slow, generalized. This study is suggestive of mild diffuse encephalopathy, nonspecific etiology. No seizures or epileptiform discharges were seen throughout the recording.  PHYSICAL EXAM General: Appears well-developed, elderly and forgetful during conversation. Psych: Affect appropriate  to situation Eyes: No scleral injection HENT: No OP obstrucion Head: Normocephalic.  Cardiovascular: Normal rate and regular rhythm.  Respiratory: Effort normal and breath sounds normal to anterior ascultation GI: Soft.  No distension. There is no tenderness.  Skin: WDI    Neurological Examination Mental Status: Alert, oriented to name, dob and age, knows this is a hospital but can't tell me why she is here. Gets date/yr wrong.  Speech fluent with some mild word searching at times. Able to follow 3 step commands with difficulty, needs prompting and pantomiming. Cranial Nerves: II: Visual fields grossly normal,  III,IV, VI: ptosis not present, extra-ocular motions intact bilaterally, pupils equal, round, reactive to light and accommodation V,VII: smile symmetric, facial light touch sensation normal bilaterally VIII: hearing normal bilaterally IX,X: uvula rises symmetrically XI: bilateral shoulder shrug XII: midline tongue extension Motor: Right : Upper extremity   5/5    Left:     Upper extremity   5/5  Lower extremity   5/5     Lower extremity   5/5 Tone and bulk:normal tone throughout; no atrophy noted Sensory: Pinprick and light touch intact throughout, bilaterally Deep Tendon Reflexes: 2+ and symmetric throughout Plantars: Right: downgoing   Left: downgoing Cerebellar: normal finger-to-nose and normal heel-to-shin test wnl, slow to do rapid testing Gait: not tested  ASSESSMENT/PLAN [name] is a 80 y.o. female with history of DM II, HLD, HTN, intracranial atherosclerosis, CMVI, and prior stroke. Patient was not a candidate for tPA due to being outside the window. No symptoms of stroke except RLE thigh weakness and confusion. Given her stroke with evidence of intracranial atherosclerosis, she will need DAPT with Plavix and ASA for 90 days then ASA alone. We will replace B12 due to not being at Neurology goal of over 500.   Stroke:  bilateral scattered infarcts,  Possible embolic source was prev discussed and she was supposed to get a 30d halter, but never did. Additional etiology could be r/t the excessive and severe intracranial atherosclerotic disease.   CT head No acute abnormality. Small vessel disease. Atrophy.  MRI  Old known infarcts in Rt BG and CR. Right frontal appears acute. Possible subacute splenium and left occipital infarcts.  MRA  Likely occluded  Lt vert. Advanced intracranial athero which is progressed since previous scan in 2021. 2D Echo Hyperdynamic 70-75% EF LDL 167 HgbA1c 6.6 VTE prophylaxis - lovenox    Diet   Diet Carb Modified Fluid consistency: Thin; Room service appropriate? Yes   none  prior to admission, now on aspirin 81 mg daily and clopidogrel 75 mg daily.  Therapy recommendations:  pending Disposition:  pending  Hypertension Home meds:  cozaar, lopressor Stable Permissive hypertension (OK if < 220/120) but gradually normalize in 5-7 days Long-term BP goal normotensive  Hyperlipidemia Home meds:  Lipitor 40mg , not resumed in hospital d/t elevated LFTs LDL 167, goal < 70 Will not increase as pt was not taking properly at home (forgetting to take meds) Pending f/u LFTs, may continue statin at discharge  Diabetes type II Controlled Home meds:  glucophage HgbA1c 6.6, goal < 7.0 CBGs Recent Labs    11/17/20 1733 11/17/20 2120 11/18/20 0636  GLUCAP 108* 176* 161*    SSI  Other Stroke Risk Factors Advanced Age >/= 47  Hx stroke/TIA Family hx stroke (mother)  Other Active Problems Memory loss- suspect she has a vascular dementia, but not formally diagnosed. Could benefit from formal out pt testing Depression- continue home zoloft Insomnia- ok to continue melatonin Elevated LFTs- monitor and may resume statin if stable  Hospital day # 1  Desiree Metzger-Cihelka, ARNP-C, ANVP-BC Pager: [phone]   ATTENDING NOTE: I reviewed above note and agree with the assessment and plan. Pt was seen and  examined.   80 year old female with history of diabetes, hypertension, CKD, hyperlipidemia, COVID 3 weeks ago and stroke in 01/2020 admitted for confusion and speech difficulty with fall.  She had a stroke in 01/2020 with confusion, gait imbalance, left-sided weakness.  CT and MRI showed right BG and left IC lacunar infarcts.  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  EF 65 to 70%, A1c 6.4, LDL 239.  She was discharged with DAPT as well as Crestor 40.  Recommend 30-day cardiac event monitoring as outpatient.  However, it was not done apparently.  On this admission CT no acute abnormality, but old right BG infarct.  MRI showed acute small right frontal, left splenium and occipital small subacute infarcts.  MRI showed left ICA siphon high-grade stenosis, left M2 moderate stenosis, left VA occlusion, tandem high-grade stenosis at the basilar artery.  The intracranial stenosis seems progressed from last admission 01/2020.  EF 70 to 75%, A1c 6.6, LDL 167 and patient admitted that she sometimes forgot taking statin medication.  UA WBC > 50, urine culture showed E. coli.  Creatinine 1.41->1.60.  EEG mild diffuse encephalopathy, no seizure.  Etiology for patient stroke not quite clear, synchronized small vessel disease versus cardioembolic source.  Still recommend 30-day cardiac event monitoring to rule out A. fib.  Continue aspirin 325 and Plavix 75 DAPT for 3 months and then aspirin alone given severe intracranial stenosis.  Resume Lipitor 80 once AST and ALT normalized.  Currently AST/ALT 104/80.  Currently on Rocephin for UTI treatment per primary team.  For detailed assessment and plan, please refer to above as I have made changes wherever appropriate.   Neurology will sign off. Please call with questions. Pt will follow up with stroke clinic NP at [facility] on 12/26/2020 as scheduled. Thanks for the consult.   12/28/2020, MD PhD Stroke  Neurology 11/18/2020 7:36 PM    To contact Stroke Continuity provider, please refer to 01/19/2021. After  hours, contact General Neurology

## 2020-11-18 NOTE — Evaluation (Signed)
Speech Language Pathology Evaluation Patient Details Name: Heather [name] MRN: [id number] DOB: 1940-05-14 Today's Date: 11/18/2020 Time: 6629-4765 SLP Time Calculation (min) (ACUTE ONLY): 30 min  Problem List:  Patient Active Problem List   Diagnosis Date Noted   Acute CVA (cerebrovascular accident) (HCC) 11/16/2020   Urinary tract infection 11/16/2020   CKD (chronic kidney disease), stage III (HCC) 11/16/2020   Essential hypertension 02/13/2020   Hyperlipidemia LDL goal <100 02/13/2020   Type 2 diabetes mellitus without complication, without long-term current use of insulin (HCC) 02/13/2020   History of stroke 2021   Past Medical History:  Past Medical History:  Diagnosis Date   Diabetes mellitus without complication (HCC)    High cholesterol    History of stroke 2021   Hypertension    Past Surgical History: History reviewed. No pertinent surgical history. HPI:  Pt is a 80 y/o female who presented to ED with confusion, non sensical speech. No dysphagia, HA, weakness of an extremity, facial droop, nor dysarthria. Daughter found pt initially with pajama top buttoned around her leg, and later face down on the floor with clothing disheveled after an unwitnessed fall. 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 Severe chronic microvascular ischemic disease PMH significant for DM, CVA 2021, HTN.   Assessment / Plan / Recommendation Clinical Impression  Pt presents with primary cognitive communication deficit marked by impairments in he areas of attention, awareness, short term memory, problem solving and executive functions. [facility] Mental Status (SLUMS) Examination administered with pt yeilding the following score: 9/30. Pt is A&Ox4, however exhibits poor recall as it relates to both structured word recall tasks and reporting  PLOF. Daughter was present during evaluation in order to clarify pt's baseline function and cognitive status. Per daughter, pt was demonstrating impairments in memory prior to this hospitalization. During informal conversation, speech was intermittently halting and mild word finding difficulties were evident, along with semantic and phonemic paraphasias. Pt required no cueing during moments of word finding and quickly self-corrected. Question impact of cognition on language function vs true expressive language impairment. SLP services recommended to f/u to primarily address cognitive functions and perform deeper diagnostic treatment into expressive language as indicated.    SLP Assessment  SLP Recommendation/Assessment: Patient needs continued Speech Lanaguage Pathology Services SLP Visit Diagnosis: Cognitive communication deficit (R41.841)    Follow Up Recommendations  Inpatient Rehab    Frequency and Duration min 2x/week  2 weeks      SLP Evaluation Cognition  Overall Cognitive Status: Impaired/Different from baseline Arousal/Alertness: Awake/alert Orientation Level: Oriented X4 Attention: Sustained;Focused;Selective Focused Attention: Appears intact Sustained Attention: Appears intact Selective Attention: Impaired Selective Attention Impairment: Verbal basic Memory: Impaired Memory Impairment: Storage deficit;Retrieval deficit;Decreased recall of new information Immediate Memory Recall:  (3/5 immediate recall SLUMS stimulus words) Awareness: Impaired Awareness Impairment: Intellectual impairment Problem Solving: Impaired Problem Solving Impairment: Functional basic Executive Function: Reasoning;Self Correcting;Organizing Reasoning: Impaired Reasoning Impairment: Functional basic Organizing: Impaired Organizing Impairment: Functional basic Self Correcting: Impaired Self Correcting Impairment: Functional basic       Comprehension  Auditory Comprehension Overall Auditory  Comprehension: Appears within functional limits for tasks assessed Visual Recognition/Discrimination Discrimination: Not tested Reading Comprehension Reading Status: Not tested    Expression Expression Primary Mode of Expression: Verbal Verbal Expression Overall Verbal Expression: Impaired Initiation: No impairment Automatic Speech: Day of week;Name Level of Generative/Spontaneous Verbalization: Conversation Naming: Impairment  Other Naming Comments: mild word finding in conversational speech Verbal Errors: Semantic paraphasias;Phonemic paraphasias;Aware of errors Pragmatics: No impairment Written Expression Dominant Hand: Right Written Expression: Not tested   Oral / Motor  Oral Motor/Sensory Function Overall Oral Motor/Sensory Function: Within functional limits Motor Speech Overall Motor Speech: Appears within functional limits for tasks assessed   GO                   Avie Echevaria, MA, CCC-SLP Acute Rehabilitation Services Office Number: [phone]  Paulette Blanch 11/18/2020, 2:34 PM

## 2020-11-18 NOTE — Progress Notes (Signed)
Pt MEWS yellow d/t elevated BP. Pt asymptomatic, Dr. Toniann Fail informed.

## 2020-11-19 LAB — GLUCOSE, CAPILLARY
Glucose-Capillary: 145 mg/dL — ABNORMAL HIGH (ref 70–99)
Glucose-Capillary: 146 mg/dL — ABNORMAL HIGH (ref 70–99)
Glucose-Capillary: 201 mg/dL — ABNORMAL HIGH (ref 70–99)
Glucose-Capillary: 218 mg/dL — ABNORMAL HIGH (ref 70–99)
Glucose-Capillary: 90 mg/dL (ref 70–99)

## 2020-11-19 LAB — BASIC METABOLIC PANEL
Anion gap: 8 (ref 5–15)
BUN: 25 mg/dL — ABNORMAL HIGH (ref 8–23)
CO2: 26 mmol/L (ref 22–32)
Calcium: 8.9 mg/dL (ref 8.9–10.3)
Chloride: 103 mmol/L (ref 98–111)
Creatinine, Ser: 1.59 mg/dL — ABNORMAL HIGH (ref 0.44–1.00)
GFR, Estimated: 33 mL/min — ABNORMAL LOW (ref >60)
Glucose, Bld: 139 mg/dL — ABNORMAL HIGH (ref 70–99)
Potassium: 3.8 mmol/L (ref 3.5–5.1)
Sodium: 137 mmol/L (ref 135–145)

## 2020-11-19 LAB — URINE CULTURE: Culture: >=100000 — AB

## 2020-11-19 LAB — MAGNESIUM: Magnesium: 2.2 mg/dL (ref 1.7–2.4)

## 2020-11-19 MED ORDER — CEPHALEXIN 500 MG PO CAPS
500.0000 mg | ORAL_CAPSULE | Freq: Two times a day (BID) | ORAL | Status: AC
  Administered 2020-11-19 – 2020-11-21 (×6): 500 mg via ORAL
  Filled 2020-11-19 (×6): qty 1

## 2020-11-19 MED ORDER — LOSARTAN POTASSIUM 50 MG PO TABS
100.0000 mg | ORAL_TABLET | Freq: Every day | ORAL | Status: DC
  Administered 2020-11-19 – 2020-11-26 (×8): 100 mg via ORAL
  Filled 2020-11-19 (×10): qty 2

## 2020-11-19 NOTE — Progress Notes (Signed)
Inpatient Rehabilitation Admissions Coordinator   I have received insurance approval for Cir but no bed likely available until Thursday or Friday. I have contacted pt's daughter, Amy and she is aware. I will follow up as soon a s bed is available.  Ottie Glazier, RN, MSN Rehab Admissions Coordinator [phone] 11/19/2020 3:08 PM

## 2020-11-19 NOTE — Progress Notes (Addendum)
PROGRESS NOTE    Heather [name]  HRC:[id number] DOB: [birth year] DOA: 11/16/2020 PCP: Caesar Bookman, NP    Brief Narrative:  Heather [name] is a 80 year old female with past medical history significant for essential pretension, hyperlipidemia, history of ischemic CVA 2021, DM2 who presented initially to [facility] ED on 7/9 after fall and speech/cognitive issues over the last 3 weeks.  Daughter who is present at bedside contributed to the HPI.  Daughter states her mother has not been acting right after being diagnosed with COVID-19 viral infection 3 weeks prior.  She would say things that did not make sense; and apparently had a shirt buttons around her leg last night.  Daughter found patient in her closet, on the floor with her short half off and her pants were down around her knees, unclear if syncopal episode.  Did not notice any weakness or facial droop.  Daughter reports she has had some memory loss issues since her ischemic stroke last year.  Patient had reported increased urinary urgency/frequency but no dysuria.  Patient denied headache, no visual changes; but also noted that she has been weak on her left side since her previous ischemic stroke.  In the ED, temperature 98.0 F, HR 70, RR 18, BP 215/94, SPO2 98% on room air.  Sodium 138, potassium 3.7, chloride 100, CO2 29, BUN 16, creatinine 1.43, AST 43, ALT 36, glucose 149.  WBC 7.4, hemoglobin 15.2, platelets 244.  High-sensitivity troponin 13 > 13.  EKG with NSR, rate 59, QTc 440, right bundle branch block, T wave inversion isolated V3, similar in appearance to EKG dated 01/18/2020.  COVID-19/SARS-CoV-2 negative.  Urinalysis with large leukocytes, negative nitrite, many bacteria, greater than 50 WBCs.  CT head without contrast with no acute intracranial pathology, notable advanced small vessel white matter disease with encephalomalacia of the right caudate and adjacent corona radiata.  MR brain with acute infarct juxtacortical right frontal lobe; with  possible subacute infarcts as well.  Neurology was consulted.  TRH consulted for further evaluation and management of acute CVA and urinary tract infection.  Patient transferred from [place] to [facility] for stroke team evaluation.   Assessment & Plan:   Principal Problem:   Acute CVA (cerebrovascular accident) (HCC) Active Problems:   Essential hypertension   Hyperlipidemia LDL goal <100   Type 2 diabetes mellitus without complication, without long-term current use of insulin (HCC)   Urinary tract infection   CKD (chronic kidney disease), stage III (HCC)   Acute metabolic encephalopathy, POA Family reports patient has been acting more confused over the last 3 weeks, after recent diagnosis of COVID-19 viral infection.  Also reported patient with memory issues since previous ischemic CVA 2021.  Patient was found down at home with reports of her shirt buttoned onto her leg.  Ammonia level within normal limits, patient afebrile without leukocytosis.  TSH 2.553, within normal limits.  B12 slightly low at 391.  Head imaging with acute and subacute infarcts as well as urinalysis suggestive of a urinary tract infection. --Continue treatment as below --Supportive care  Acute ischemic CVA Patient presenting to [facility] ED after being found down at home by family.  Per family report, patient has not been acting right with worsening confusion over the last 3 weeks; following recent diagnosis of COVID-19 viral infection.  CT head without contrast with no acute intracranial findings but notable for advanced small vessel white matter disease and encephalomalacia of the right caudate/corona radiata consistent with prior CVA.  MR brain with acute infarct just a cortical right frontal lobe with subacute infarct left corpus callosum/occipital lobe.  MRA head/neck with advanced intracranial atherosclerosis with multifocal high-grade narrowing which is progressed from 2021 and chronically diminished flow left  vertebral artery due to none resolved proximal stenosis.  Hemoglobin A1c 6.6, LDL 167.  TTE with LVEF 60-75% grade 1 diastolic dysfunction.  EEG with diffuse mild encephalopathy nonspecific without seizure or epileptiform discharges. --Neurology following, appreciate assistance --Restarted metoprolol tartrate 25 mg p.o. twice daily and losartan 100 mg p.o. daily after allowing permissive hypertension for 48 hours --DAPT w/ Plavix  daily and aspirin 81 mg daily x 21 days followed by aspirin alone --Holding atorvastatin until LFTs normalize --PT/OT recommending CIR --Continue monitor from telemetry  E. coli urinary tract infection Patient reported increased urinary frequency/urgency without dysuria.  Urinalysis with large leukoesterase, negative nitrite, many bacteria, greater than 50 WBCs.  Patient is afebrile without leukocytosis.  This could also be a contributing factor to her worsening confusion over the last 3 weeks.  Urine culture with E. coli --Transition ceftriaxone to Keflex based on culture susceptibilities to complete 5-day course  Elevated AST AST slightly available at 43 on admission.  Unclear etiology, possible mild acute liver injury from unknown downtime at home.  Given need for increased statin dose for CVA, will trend LFTs. --AST 43>104 --ALT 36>80 --Tbili 0.6 --Repeat CMP in a.m.  Hypokalemia Potassium 3.8, magnesium 2.2 this morning --Repeat electrolytes in the a.m.  Essential hypertension Home regimen includes losartan 100 mg p.o. daily, metoprolol tartrate 25 mg p.o. twice daily. -- Restarting home metoprolol 25 mg p.o. twice daily and losartan 100 mg p.o. daily after allowing permissive hypertension over the last 48 hours --Hydralazine 25 mg p.o. q6h prn SBP >220 or DBP >110  --Labetalol as needed  Hyperlipidemia Lipid panel with total cholesterol 227, HDL 36, LDL 167.  Goal HDL less than 70 with acute CVA. --Holding atorvastatin until LFTs normalize  Type 2  diabetes mellitus Hemoglobin A1c 6.6, well controlled.  On metformin 500 mg p.o. daily at home. --Hold oral hypoglycemics while inpatient --SSI for coverage --CBG before every meal/at bedtime  Depression --Continue sertraline 25 mg p.o. daily   DVT prophylaxis: enoxaparin (LOVENOX) injection 30 mg Start: 11/16/20 2200   Code Status: DNR Family Communication: Updated daughter present at bedside this morning.  Disposition Plan:  Level of care: Telemetry Medical Status is: Inpatient  Remains inpatient appropriate because:Altered mental status, Ongoing diagnostic testing needed not appropriate for outpatient work up, Unsafe d/c plan, IV treatments appropriate due to intensity of illness or inability to take PO, and Inpatient level of care appropriate due to severity of illness  Dispo: The patient is from: Home              Anticipated d/c is to: SNF              Patient currently is not medically stable to d/c.   Difficult to place patient No   Consultants:  Neurology  Procedures:  TTE EEG:   Antimicrobials:  Ceftriaxone 7/9 - 7/10 Keflex 7/11>>   Subjective: Patient seen examined bedside, resting comfortably.  Speech much improved and close to her normal baseline now.  Awaiting CIR bed/insurance authorization.  Daughter present at bedside.  No other complaints or concerns at this time. Denies headache, no visual changes, no chest pain, palpitations, no shortness of breath, no abdominal pain, no fever/chills/night sweats, no nausea/vomiting/diarrhea, no paresthesias.  No acute events overnight  per nursing staff.  Objective: Vitals:   11/18/20 2348 11/19/20 0427 11/19/20 0805 11/19/20 1032  BP: (!) 167/51 (!) 193/67 (!) 173/46 (!) 166/66  Pulse: (!) 56 69 (!) 59 (!) 53  Resp: 18 18 19    Temp: 98.7 F (37.1 C) 97.9 F (36.6 C) 97.8 F (36.6 C)   TempSrc: Oral Oral Oral   SpO2: 96% 98% 98%   Weight:      Height:        Intake/Output Summary (Last 24 hours) at  11/19/2020 1056 Last data filed at 11/19/2020 0806 Gross per 24 hour  Intake 774 ml  Output 1150 ml  Net -376 ml   Filed Weights   11/16/20 1208 11/17/20 1819  Weight: 61.7 kg 61.7 kg    Examination:  General exam: Appears calm and comfortable  Respiratory system: Clear to auscultation. Respiratory effort normal.  On room air Cardiovascular system: S1 & S2 heard, RRR. No JVD, murmurs, rubs, gallops or clicks. No pedal edema. Gastrointestinal system: Abdomen is nondistended, soft and nontender. No organomegaly or masses felt. Normal bowel sounds heard. Central nervous system: Alert and oriented.  Some difficulty with word finding and mild left lower extremity weakness otherwise no focal neurological deficits Extremities: Left lower extremity muscle strength 4+/5, otherwise strength to bilateral upper extremities and right lower extremity preserved 5/5. Skin: No rashes, lesions or ulcers Psychiatry: Judgement and insight appear slightly diminished. Mood & affect appropriate.     Data Reviewed: I have personally reviewed following labs and imaging studies  CBC: Recent Labs  Lab 11/16/20 1349  WBC 7.4  NEUTROABS 5.9  HGB 15.2*  HCT 45.9  MCV 93.7  PLT 244   Basic Metabolic Panel: Recent Labs  Lab 11/16/20 1349 11/17/20 0527 11/18/20 0402 11/19/20 0603  NA 138 137 137 137  K 3.7 3.5 3.4* 3.8  CL 100 101 103 103  CO2 29 27 24 26   GLUCOSE 149* 141* 160* 139*  BUN 16 18 24* 25*  CREATININE 1.43* 1.41* 1.60* 1.59*  CALCIUM 9.7 9.3 9.0 8.9  MG  --   --  2.2 2.2   GFR: Estimated Creatinine Clearance: 23.9 mL/min (A) (by C-G formula based on SCr of 1.59 mg/dL (H)). Liver Function Tests: Recent Labs  Lab 11/16/20 1349 11/18/20 0402  AST 43* 104*  ALT 36 80*  ALKPHOS 158* 187*  BILITOT 0.9 0.6  PROT 7.7 5.7*  ALBUMIN 4.1 2.8*   No results for input(s): LIPASE, AMYLASE in the last 168 hours. Recent Labs  Lab 11/17/20 0525  AMMONIA 10   Coagulation  Profile: Recent Labs  Lab 11/16/20 1349  INR 0.9   Cardiac Enzymes: No results for input(s): CKTOTAL, CKMB, CKMBINDEX, TROPONINI in the last 168 hours. BNP (last 3 results) No results for input(s): PROBNP in the last 8760 hours. HbA1C: Recent Labs    11/17/20 0525  HGBA1C 6.6*   CBG: Recent Labs  Lab 11/18/20 0636 11/18/20 1114 11/18/20 1708 11/19/20 0050 11/19/20 0809  GLUCAP 161* 148* 136* 145* 218*   Lipid Profile: Recent Labs    11/17/20 0525  CHOL 227*  HDL 36*  LDLCALC 167*  TRIG 120  CHOLHDL 6.3   Thyroid Function Tests: Recent Labs    11/17/20 0526  TSH 2.553   Anemia Panel: Recent Labs    11/17/20 0526  VITAMINB12 391   Sepsis Labs: No results for input(s): PROCALCITON, LATICACIDVEN in the last 168 hours.  Recent Results (from the past 240 hour(s))  Resp Panel  by RT-PCR (Flu A&B, Covid) Nasopharyngeal Swab     Status: None   Collection Time: 11/16/20  1:54 PM   Specimen: Nasopharyngeal Swab; Nasopharyngeal(NP) swabs in vial transport medium  Result Value Ref Range Status   SARS Coronavirus 2 by RT PCR NEGATIVE NEGATIVE Final    Comment: (NOTE) SARS-CoV-2 target nucleic acids are NOT DETECTED.  The SARS-CoV-2 RNA is generally detectable in upper respiratory specimens during the acute phase of infection. The lowest concentration of SARS-CoV-2 viral copies this assay can detect is 138 copies/mL. A negative result does not preclude SARS-Cov-2 infection and should not be used as the sole basis for treatment or other patient management decisions. A negative result may occur with  improper specimen collection/handling, submission of specimen other than nasopharyngeal swab, presence of viral mutation(s) within the areas targeted by this assay, and inadequate number of viral copies(<138 copies/mL). A negative result must be combined with clinical observations, patient history, and epidemiological information. The expected result is  Negative.  Fact Sheet for Patients:  BloggerCourse.comhttps://www.fda.gov/media/152166/download  Fact Sheet for Healthcare Providers:  SeriousBroker.ithttps://www.fda.gov/media/152162/download  This test is no t yet approved or cleared by the [place] FDA and  has been authorized for detection and/or diagnosis of SARS-CoV-2 by FDA under an Emergency Use Authorization (EUA). This EUA will remain  in effect (meaning this test can be used) for the duration of the COVID-19 declaration under Section 564(b)(1) of the Act, 21 U.S.C.section 360bbb-3(b)(1), unless the authorization is terminated  or revoked sooner.       Influenza A by PCR NEGATIVE NEGATIVE Final   Influenza B by PCR NEGATIVE NEGATIVE Final    Comment: (NOTE) The Xpert Xpress SARS-CoV-2/FLU/RSV plus assay is intended as an aid in the diagnosis of influenza from Nasopharyngeal swab specimens and should not be used as a sole basis for treatment. Nasal washings and aspirates are unacceptable for Xpert Xpress SARS-CoV-2/FLU/RSV testing.  Fact Sheet for Patients: BloggerCourse.comhttps://www.fda.gov/media/152166/download  Fact Sheet for Healthcare Providers: SeriousBroker.ithttps://www.fda.gov/media/152162/download  This test is not yet approved or cleared by the [place] FDA and has been authorized for detection and/or diagnosis of SARS-CoV-2 by FDA under an Emergency Use Authorization (EUA). This EUA will remain in effect (meaning this test can be used) for the duration of the COVID-19 declaration under Section 564(b)(1) of the Act, 21 U.S.C. section 360bbb-3(b)(1), unless the authorization is terminated or revoked.  Performed at [facility], [address] [place], [place] [address]   Urine culture     Status: Abnormal   Collection Time: 11/16/20  2:40 PM   Specimen: Urine, Clean Catch  Result Value Ref Range Status   Specimen Description   Final    URINE, CLEAN CATCH Performed at [facility], [address]  [place], [place] [address]    Special Requests   Final    NONE Performed at [facility], [address] [place], [place] [address]    Culture >=100,000 COLONIES/mL ESCHERICHIA COLI (A)  Final   Report Status 11/19/2020 FINAL  Final   Organism ID, Bacteria ESCHERICHIA COLI (A)  Final      Susceptibility   Escherichia coli - MIC*    AMPICILLIN >=32 RESISTANT Resistant     CEFAZOLIN <=4 SENSITIVE Sensitive     CEFEPIME <=0.12 SENSITIVE Sensitive     CEFTRIAXONE <=0.25 SENSITIVE Sensitive     CIPROFLOXACIN >=4 RESISTANT Resistant     GENTAMICIN <=1 SENSITIVE Sensitive     IMIPENEM <=0.25 SENSITIVE Sensitive  NITROFURANTOIN <=16 SENSITIVE Sensitive     TRIMETH/SULFA >=320 RESISTANT Resistant     AMPICILLIN/SULBACTAM 16 INTERMEDIATE Intermediate     PIP/TAZO <=4 SENSITIVE Sensitive     * >=100,000 COLONIES/mL ESCHERICHIA COLI         Radiology Studies: EEG adult  Result Date: Dec 09, 2020 Charlsie Quest, MD     2020/12/09 10:57 AM Patient Name: EDLIN FORD MRN: [id number] Epilepsy Attending: Charlsie Quest Referring Physician/Provider: Jimmye Norman, NP Date: December 09, 2020 Duration: 23.12 mins Patient history: 80 year old female with COVID 3 weeks ago who was found confused on the floor with nonsensical speech.  EEG to evaluate for seizures. Level of alertness: Awake AEDs during EEG study: None Technical aspects: This EEG study was done with scalp electrodes positioned according to the 10-20 International system of electrode placement. Electrical activity was acquired at a sampling rate of 500Hz  and reviewed with a high frequency filter of 70Hz  and a low frequency filter of 1Hz . EEG data were recorded continuously and digitally stored. Description: The posterior dominant rhythm consists of 10 Hz activity of moderate voltage (25-35 uV) seen predominantly in posterior head regions, symmetric and reactive to eye opening and eye closing. EEG showed intermittent  generalized 3 to 6 Hz theta-delta slowing. Hyperventilation and photic stimulation were not performed.   ABNORMALITY - Intermittent slow, generalized IMPRESSION: This study is suggestive of mild diffuse encephalopathy, nonspecific etiology. No seizures or epileptiform discharges were seen throughout the recording. Priyanka        Scheduled Meds:   stroke: mapping our early stages of recovery book   Does not apply Once   aspirin EC  325 mg Oral Daily   cephALEXin  500 mg Oral Q12H   clopidogrel  75 mg Oral Daily   enoxaparin (LOVENOX) injection  30 mg Subcutaneous Q24H   insulin aspart  0-9 Units Subcutaneous TID WC   losartan  100 mg Oral Daily   melatonin  3 mg Oral QHS   metoprolol tartrate  25 mg Oral BID   sertraline  25 mg Oral Daily   traZODone  25 mg Oral QHS   Continuous Infusions:     LOS: 2 days    Time spent: 39 minutes spent on chart review, discussion with nursing staff, consultants, updating family and interview/physical exam; more than 50% of that time was spent in counseling and/or coordination of care.     , DO [facility] Available via Epic secure chat 7am-7pm After these hours, please refer to coverage provider listed on [email or web address] 11/19/2020, 10:56 AM

## 2020-11-19 NOTE — Plan of Care (Signed)
  Problem: Education: Goal: Knowledge of General Education information will improve Description: Including pain rating scale, medication(s)/side effects and non-pharmacologic comfort measures Outcome: Progressing   Problem: Health Behavior/Discharge Planning: Goal: Ability to manage health-related needs will improve Outcome: Progressing   Problem: Clinical Measurements: Goal: Ability to maintain clinical measurements within normal limits will improve Outcome: Progressing Goal: Will remain free from infection Outcome: Progressing Goal: Diagnostic test results will improve Outcome: Progressing Goal: Respiratory complications will improve Outcome: Progressing Goal: Cardiovascular complication will be avoided Outcome: Progressing   Problem: Activity: Goal: Risk for activity intolerance will decrease Outcome: Progressing   Problem: Nutrition: Goal: Adequate nutrition will be maintained Outcome: Progressing   Problem: Coping: Goal: Level of anxiety will decrease Outcome: Progressing   Problem: Elimination: Goal: Will not experience complications related to bowel motility Outcome: Progressing Goal: Will not experience complications related to urinary retention Outcome: Progressing   Problem: Pain Managment: Goal: General experience of comfort will improve Outcome: Progressing   Problem: Safety: Goal: Ability to remain free from injury will improve Outcome: Progressing   Problem: Skin Integrity: Goal: Risk for impaired skin integrity will decrease Outcome: Progressing   Problem: Education: Goal: Knowledge of disease or condition will improve Outcome: Progressing Goal: Knowledge of secondary prevention will improve Outcome: Progressing Goal: Knowledge of patient specific risk factors addressed and post discharge goals established will improve Outcome: Progressing   Problem: Health Behavior/Discharge Planning: Goal: Ability to manage health-related needs will  improve Outcome: Progressing   Problem: Self-Care: Goal: Ability to participate in self-care as condition permits will improve Outcome: Progressing   Problem: Ischemic Stroke/TIA Tissue Perfusion: Goal: Complications of ischemic stroke/TIA will be minimized Outcome: Progressing   

## 2020-11-19 NOTE — Progress Notes (Signed)
Physical Therapy Treatment Patient Details Name: Heather [name] MRN: [id number] DOB: [birth year] Today's Date: 11/19/2020    History of Present Illness Pt is a 80 y/o female who presents with AMS. Daughter found pt initially with pajama top buttoned around her leg, and later face down on the floor with clothing disheveled after an unwitnessed fall. 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 PMH significant for DM, CVA 2021, HTN.    PT Comments    Progressing towards physical therapy goals. Did well ambulating into the bathroom to void however required assist with the RW for turns and to line up body with toilet prior to stand>sit.  Decreased safety awareness noted throughout session. Continue to feel that pt would benefit from CIR level therapies at d/c Will continue to follow.    Follow Up Recommendations  CIR;Supervision for mobility/OOB     Equipment Recommendations  None recommended by PT    Recommendations for Other Services Rehab consult     Precautions / Restrictions Precautions Precautions: Fall Restrictions Weight Bearing Restrictions: No    Mobility  Bed Mobility Overal bed mobility: Needs Assistance Bed Mobility: Supine to Sit     Supine to sit: Min guard     General bed mobility comments: Increased time and close guard as pt transitioned to full sitting position.    Transfers Overall transfer level: Needs assistance Equipment used: Rolling walker (2 wheeled) Transfers: Sit to/from Stand Sit to Stand: Min assist         General transfer comment: Assist for power-up to full stand and for walker management when turning to prepare for stand>sit.  Ambulation/Gait Ambulation/Gait assistance: Min assist Gait Distance (Feet): 175 Feet Assistive device: Rolling walker (2 wheeled) Gait Pattern/deviations:  Step-through pattern;Decreased stride length;Drifts right/left;Trunk flexed;Narrow base of support Gait velocity: Decreased overall, however slowed with fatigue. Gait velocity interpretation: 1.31 - 2.62 ft/sec, indicative of limited community ambulator General Gait Details: Difficulty with turns, stepping on her own feet at times when turning. Pt required frequent assist for RW management. As pt fatigued, required more assist however started out strong with good gait speed.   Stairs             Wheelchair Mobility    Modified Rankin (Stroke Patients Only) Modified Rankin (Stroke Patients Only) Pre-Morbid Rankin Score: Slight disability Modified Rankin: Moderately severe disability     Balance Overall balance assessment: Needs assistance Sitting-balance support: Feet supported;No upper extremity supported Sitting balance-Leahy Scale: Fair Sitting balance - Comments: Mild posterior lean with LE MMT   Standing balance support: Single extremity supported;During functional activity Standing balance-Leahy Scale: Poor Standing balance comment: Requires at least 1 UE support for standing activity.                            Cognition Arousal/Alertness: Awake/alert Behavior During Therapy: WFL for tasks assessed/performed Overall Cognitive Status: Impaired/Different from baseline Area of Impairment: Orientation;Attention;Memory;Following commands;Safety/judgement;Awareness;Problem solving                 Orientation Level: Place Current Attention Level: Sustained Memory: Decreased short-term memory Following Commands: Follows one step commands consistently;Follows one step commands with increased time Safety/Judgement: Decreased awareness of safety;Decreased awareness of deficits Awareness: Emergent Problem Solving: Difficulty sequencing;Requires verbal cues;Slow processing General Comments: Pt with decreased STM, and following one step commands with increased  time. Pt with decreased safety awareness and requires increased processing time throughout functional mobility.      Exercises      General Comments        Pertinent Vitals/Pain Pain Assessment: Faces Faces Pain Scale: No hurt    Home Living Family/patient expects to be discharged to:: Private residence Living Arrangements:  (lives with daughter) Available Help at Discharge: Available 24 hours/day (daughter can work remote temporarily) Type of Home: House Home Access: Level entry   Home Layout: Two level;Able to live on main level with bedroom/bathroom Home Equipment: Dan Humphreys - 2 wheels;Wheelchair - manual Additional Comments: Pt reports she lives with her daughter has a walker and a wheelchair.    Prior Function Level of Independence: Independent      Comments: pt enjoys walking her dog, going shopping. Pt typically leans on grocery cart for support, but does not use AD at home   PT Goals (current goals can now be found in the care plan section) Acute Rehab PT Goals Patient Stated Goal: "Drink my coffee" - unable to discuss bigger picture with functional deficits and rehab goals. PT Goal Formulation: Patient unable to participate in goal setting Time For Goal Achievement: 12/02/20 Potential to Achieve Goals: Good Progress towards PT goals: Progressing toward goals    Frequency    Min 4X/week      PT Plan Current plan remains appropriate    Co-evaluation              AM-PAC PT "6 Clicks" Mobility   Outcome Measure  Help needed turning from your back to your side while in a flat bed without using bedrails?: A Little Help needed moving from lying on your back to sitting on the side of a flat bed without using bedrails?: A Little Help needed moving to and from a bed to a chair (including a wheelchair)?: A Little Help needed standing up from a chair using your arms (e.g., wheelchair or bedside chair)?: A Little Help needed to walk in hospital room?: A  Little Help needed climbing 3-5 steps with a railing? : A Little 6 Click Score: 18    End of Session Equipment Utilized During Treatment: Gait belt Activity Tolerance: Patient limited by fatigue Patient left: in chair;with call bell/phone within reach;with chair alarm set Nurse Communication: Mobility status PT Visit Diagnosis: Unsteadiness on feet (R26.81)     Time: 9604-5409 PT Time Calculation (min) (ACUTE ONLY): 27 min  Charges:  $Gait Training: 23-37 mins                     Conni Slipper, PT, DPT Acute Rehabilitation Services Pager: [phone] Office: [phone]    Marylynn Pearson 11/19/2020, 3:42 PM

## 2020-11-20 DIAGNOSIS — E119 Type 2 diabetes mellitus without complications: Secondary | ICD-10-CM

## 2020-11-20 DIAGNOSIS — N390 Urinary tract infection, site not specified: Secondary | ICD-10-CM

## 2020-11-20 DIAGNOSIS — N1832 Chronic kidney disease, stage 3b: Secondary | ICD-10-CM

## 2020-11-20 LAB — COMPREHENSIVE METABOLIC PANEL
ALT: 96 U/L — ABNORMAL HIGH (ref 0–44)
AST: 100 U/L — ABNORMAL HIGH (ref 15–41)
Albumin: 2.8 g/dL — ABNORMAL LOW (ref 3.5–5.0)
Alkaline Phosphatase: 223 U/L — ABNORMAL HIGH (ref 38–126)
Anion gap: 5 (ref 5–15)
BUN: 20 mg/dL (ref 8–23)
CO2: 27 mmol/L (ref 22–32)
Calcium: 8.9 mg/dL (ref 8.9–10.3)
Chloride: 105 mmol/L (ref 98–111)
Creatinine, Ser: 1.48 mg/dL — ABNORMAL HIGH (ref 0.44–1.00)
GFR, Estimated: 36 mL/min — ABNORMAL LOW (ref >60)
Glucose, Bld: 140 mg/dL — ABNORMAL HIGH (ref 70–99)
Potassium: 4.4 mmol/L (ref 3.5–5.1)
Sodium: 137 mmol/L (ref 135–145)
Total Bilirubin: 0.4 mg/dL (ref 0.3–1.2)
Total Protein: 5.2 g/dL — ABNORMAL LOW (ref 6.5–8.1)

## 2020-11-20 LAB — CBC WITH DIFFERENTIAL/PLATELET
Abs Immature Granulocytes: 0.02 10*3/uL (ref 0.00–0.07)
Basophils Absolute: 0.1 10*3/uL (ref 0.0–0.1)
Basophils Relative: 1 %
Eosinophils Absolute: 0.5 10*3/uL (ref 0.0–0.5)
Eosinophils Relative: 9 %
HCT: 41.3 % (ref 36.0–46.0)
Hemoglobin: 13.6 g/dL (ref 12.0–15.0)
Immature Granulocytes: 0 %
Lymphocytes Relative: 16 %
Lymphs Abs: 1 10*3/uL (ref 0.7–4.0)
MCH: 31.4 pg (ref 26.0–34.0)
MCHC: 32.9 g/dL (ref 30.0–36.0)
MCV: 95.4 fL (ref 80.0–100.0)
Monocytes Absolute: 0.4 10*3/uL (ref 0.1–1.0)
Monocytes Relative: 6 %
Neutro Abs: 4 10*3/uL (ref 1.7–7.7)
Neutrophils Relative %: 68 %
Platelets: 236 10*3/uL (ref 150–400)
RBC: 4.33 MIL/uL (ref 3.87–5.11)
RDW: 13.4 % (ref 11.5–15.5)
WBC: 5.9 10*3/uL (ref 4.0–10.5)
nRBC: 0 % (ref 0.0–0.2)

## 2020-11-20 LAB — GLUCOSE, CAPILLARY
Glucose-Capillary: 136 mg/dL — ABNORMAL HIGH (ref 70–99)
Glucose-Capillary: 191 mg/dL — ABNORMAL HIGH (ref 70–99)
Glucose-Capillary: 110 mg/dL — ABNORMAL HIGH (ref 70–99)
Glucose-Capillary: 197 mg/dL — ABNORMAL HIGH (ref 70–99)

## 2020-11-20 LAB — MAGNESIUM: Magnesium: 2.1 mg/dL (ref 1.7–2.4)

## 2020-11-20 NOTE — Plan of Care (Signed)
  Problem: Health Behavior/Discharge Planning: Goal: Ability to manage health-related needs will improve Outcome: Progressing   Problem: Clinical Measurements: Goal: Ability to maintain clinical measurements within normal limits will improve Outcome: Progressing   Problem: Safety: Goal: Ability to remain free from injury will improve Outcome: Progressing   Problem: Skin Integrity: Goal: Risk for impaired skin integrity will decrease Outcome: Progressing   Problem: Ischemic Stroke/TIA Tissue Perfusion: Goal: Complications of ischemic stroke/TIA will be minimized Outcome: Progressing

## 2020-11-20 NOTE — Progress Notes (Signed)
Provider paged to inform pt HR dropping to 40's. Pt is alert oriented x 4, pt is resting and asymptomatic., no distress noted. MD stated to monitor HR.

## 2020-11-20 NOTE — Care Management Important Message (Signed)
Important Message  Patient Details  Name: Heather [name] MRN: [id number] Date of Birth: Sep 21, 1940   Medicare Important Message Given:  Yes     Dorena Bodo 11/20/2020, 3:27 PM

## 2020-11-20 NOTE — Progress Notes (Signed)
Inpatient Rehabilitation Admissions Coordinator   Long term facility facilitator approached me to inquire into plans for the patient's rehab. I advised her that I do not have family permission to discuss. I contacted pt's daughter, Amy, by phone. Family had begun placement prior to admit. I have requested Amy to clarify preference for CIR and d/c home vs SNF with eventual ALF placement. She will follow up with me later today.  Ottie Glazier, RN, MSN Rehab Admissions Coordinator [phone] 11/20/2020 10:47 AM

## 2020-11-20 NOTE — Progress Notes (Signed)
Physical Therapy Treatment Patient Details Name: Heather [name] MRN: [id number] DOB: Dec 07, 1940 Today's Date: 11/20/2020    History of Present Illness Pt is a 80 y/o female who presents with AMS. Daughter found pt initially with pajama top buttoned around her leg, and later face down on the floor with clothing disheveled after an unwitnessed fall. 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 PMH significant for DM, CVA 2021, HTN.    PT Comments    Pt progressing towards physical therapy goals. Was able to perform transfers and ambulation with gross min assist and RW for support. Pt making minimal corrective changes with VC's throughout session. Requires multimodal cues for closer walker proximity and general safety. Pt unable to discuss long term goals but seems to be very interested in CIR. Will continue to follow.     Follow Up Recommendations  CIR;Supervision for mobility/OOB     Equipment Recommendations  None recommended by PT    Recommendations for Other Services Rehab consult     Precautions / Restrictions Precautions Precautions: Fall Restrictions Weight Bearing Restrictions: No    Mobility  Bed Mobility Overal bed mobility: Needs Assistance Bed Mobility: Supine to Sit     Supine to sit: Min guard     General bed mobility comments: Increased time and close guard as pt transitioned to full sitting position.    Transfers Overall transfer level: Needs assistance Equipment used: Rolling walker (2 wheeled) Transfers: Sit to/from Stand Sit to Stand: Min assist         General transfer comment: Assist for power-up to full stand from EOB as well as from toilet  Ambulation/Gait Ambulation/Gait assistance: Min assist Gait Distance (Feet): 200 Feet Assistive device: Rolling walker (2 wheeled) Gait Pattern/deviations:  Step-through pattern;Decreased stride length;Drifts right/left;Trunk flexed;Narrow base of support Gait velocity: Decreased overall, however slowed with fatigue. Gait velocity interpretation: 1.31 - 2.62 ft/sec, indicative of limited community ambulator General Gait Details: Difficulty with turns, and requires assist for walker management and balance. As pt fatigued, required more assist however started out strong with good gait speed.   Stairs             Wheelchair Mobility    Modified Rankin (Stroke Patients Only) Modified Rankin (Stroke Patients Only) Pre-Morbid Rankin Score: Slight disability Modified Rankin: Moderately severe disability     Balance Overall balance assessment: Needs assistance Sitting-balance support: Feet supported;No upper extremity supported Sitting balance-Leahy Scale: Fair Sitting balance - Comments: Mild posterior lean with LE MMT   Standing balance support: Single extremity supported;During functional activity Standing balance-Leahy Scale: Poor Standing balance comment: Requires at least 1 UE support for standing activity.                            Cognition Arousal/Alertness: Awake/alert Behavior During Therapy: WFL for tasks assessed/performed Overall Cognitive Status: Impaired/Different from baseline Area of Impairment: Attention;Memory;Following commands;Safety/judgement;Awareness;Problem solving                   Current Attention Level: Sustained Memory: Decreased short-term memory Following Commands: Follows one step commands consistently;Follows one step commands with increased time Safety/Judgement: Decreased awareness of safety;Decreased awareness of deficits Awareness: Emergent Problem Solving: Difficulty sequencing;Requires verbal cues;Slow processing General Comments: Pt with decreased STM, and following one step commands with increased time. Pt with decreased safety awareness and requires  increased processing  time throughout functional mobility.      Exercises      General Comments        Pertinent Vitals/Pain Pain Assessment: Faces Faces Pain Scale: No hurt    Home Living                      Prior Function            PT Goals (current goals can now be found in the care plan section) Acute Rehab PT Goals Patient Stated Goal: Pt unsure of goals. Seems to want to go to CIR but is not able to talk about what her goals are after - home vs ALF PT Goal Formulation: Patient unable to participate in goal setting Time For Goal Achievement: 12/02/20 Potential to Achieve Goals: Good Progress towards PT goals: Progressing toward goals    Frequency    Min 4X/week      PT Plan Current plan remains appropriate    Co-evaluation              AM-PAC PT "6 Clicks" Mobility   Outcome Measure  Help needed turning from your back to your side while in a flat bed without using bedrails?: A Little Help needed moving from lying on your back to sitting on the side of a flat bed without using bedrails?: A Little Help needed moving to and from a bed to a chair (including a wheelchair)?: A Little Help needed standing up from a chair using your arms (e.g., wheelchair or bedside chair)?: A Little Help needed to walk in hospital room?: A Little Help needed climbing 3-5 steps with a railing? : A Little 6 Click Score: 18    End of Session Equipment Utilized During Treatment: Gait belt Activity Tolerance: Patient limited by fatigue Patient left: in chair;with call bell/phone within reach;with chair alarm set Nurse Communication: Mobility status PT Visit Diagnosis: Unsteadiness on feet (R26.81)     Time: 2671-2458 PT Time Calculation (min) (ACUTE ONLY): 37 min  Charges:  $Gait Training: 23-37 mins                     Conni Slipper, PT, DPT Acute Rehabilitation Services Pager: [phone] Office: [phone]    Heather [name] 11/20/2020, 3:02 PM

## 2020-11-20 NOTE — Progress Notes (Addendum)
PROGRESS NOTE    Heather [name]  JSE:[id number] DOB: 1940-11-05 DOA: 11/16/2020 PCP: Caesar Bookman, NP   Brief Narrative:  The patient is a 80 year old Caucasian female with a past medical history significant for but not limited to essential hypertension, hyperlipidemia, history of ischemic CVA in 2021, diabetes mellitus type 2 as well as other comorbidities who presented to [facility] long ED on 11/16/2020 after a fall and speech and cognitive issues over the last few weeks.  Daughter was at bedside and contributed to the HPI and stated that her mother had not been acting right after being diagnosed with COVID-19 3 weeks prior.  Patient's daughter states that the patient stating that did not make sense and apparently had a short that was buttoned around her leg last night.  The patient's daughter found the patient in her closet on the floor with her short half-life and her pants down around her knees and unclear if his syncopal episode.  Daughter did not notice any weakness or facial droop but reported the patient did have some memory issues since her ischemic stroke yesterday.  Patient has reportedly increased urinary frequency and urgency but no dysuria.  Pulmonary febrile to the ED she had basic blood work done and urinalysis showed large leukocytes, negative nitrites, many bacteria and greater than 50 WBCs.  She is empirically started on antibiotics and head CT done showed no acute intracranial pathology but she underwent a MRI of the brain which showed acute infarct of the juxtacortical right frontal lobe with possible subacute infarcts as well.  Neurology was consulted and they recommended dual antiplatelet therapy.  TRH was consulted and admitted this patient for acute CVA and UTI.  Subsequently she improved from a neuro standpoint and was recommended for dual antiplatelet therapy for 3 months.  Patient still little confused however PT OT recommending CIR.  CIR working the patient up and is unclear of the  patient's disposition at this time.  Assessment & Plan:   Principal Problem:   Acute CVA (cerebrovascular accident) [facility]) Active Problems:   Essential hypertension   Hyperlipidemia LDL goal <100   Type 2 diabetes mellitus without complication, without long-term current use of insulin (HCC)   Urinary tract infection   CKD (chronic kidney disease), stage III (HCC)  Acute metabolic encephalopathy, POA  -Family reports patient has been acting more confused over the last 3 weeks, after recent diagnosis of COVID-19 viral infection.   -Also reported patient with memory issues since previous ischemic CVA 2021.   -Patient was found down at home with reports of her shirt buttoned onto her leg.   -Ammonia level within normal limits, patient afebrile without leukocytosis.   -TSH 2.553, within normal limits.  -B12 slightly low at 391.   -Head imaging with acute and subacute infarcts as well as urinalysis suggestive of a urinary tract infection. -Continue treatment as below -Delirium Precautions -Supportive care   Acute ischemic CVA  -Patient was presenting to [facility] ED after being found down at home by family.  Per family report, patient has not been acting right with worsening confusion over the last 3 weeks; following recent diagnosis of COVID-19 viral infection.   -CT head without contrast with no acute intracranial findings but notable for advanced small vessel white matter disease and encephalomalacia of the right caudate/corona radiata consistent with prior CVA.   -MR brain with acute infarct just a cortical right frontal lobe with subacute infarct left corpus callosum/occipital lobe.   -MRA head/neck with advanced  intracranial atherosclerosis with multifocal high-grade narrowing which is progressed from 2021 and chronically diminished flow left vertebral artery due to none resolved proximal stenosis.   -Hemoglobin A1c 6.6, LDL 167.   -TTE with LVEF 60-75% grade 1 diastolic dysfunction.   -EEG  with diffuse mild encephalopathy nonspecific without seizure or epileptiform discharges. -Neurologywas following, appreciate assistance -Restarted metoprolol tartrate 25 mg p.o. twice daily and losartan 100 mg p.o. daily after allowing permissive hypertension for 48 hours -Per Neuro Recc's C/w DAPT w/ Plavix 75mg  daily and aspirin 325 mg daily x 3 Months days followed by aspirin alone -Currently Holding atorvastatin until LFTs normalize -PT/OT recommending CIR -Continue monitor on Telemetry    E. coli urinary tract infection -Patient reported increased urinary frequency/urgency without dysuria.   -Urinalysis with cloudy appearance and small hemoglobin on urine dipstick, large leukocytes, negative nitrites, many bacteria, greater than 50 RBCs per high-power field, 0-5 squamous epithelial cells, and greater than 50 WBCs -This could also be a contributing factor to her worsening confusion over the last 3 weeks.  -Urine culture with > 100,000 CFU of E Coli with resistance to TMP-SMX, ciprofloxacin, and ampicillin with intermediate sensitivities to ampicillin/sulbactam with sensitivities to the rest -Transitioned IV Ceftriaxone to Keflex based on culture susceptibilities to complete 5-day course   Abnormal LFTs -AST went from 43 -> 104 -> 100 -ALT went from 36 -> 80 -> 96 -Unclear etiology, possible mild acute liver injury from unknown downtime at home.  Given need for increased statin dose for CVA, will trend LFTs. -T Bili was normal at 0.4 -If continues to worsen or not improve will obtain RUQ U/S and Acute Hepatitis Panel -Continue to Monitor and Trend Hepatic Fxn Panel and repeat CMP in the AM    Hypokalemia -Improved.  Patient's potassium is now 4.4 and magnesium is 2.1 -Continue to monitor and replete as necessary -Repeat CMP in a.m.  CKD Stage 3b -Stable. Patient's BUN/Cr is improved and went from 24/1.60 -> 25/1.59 -> 20/1.48 -Avoid nephrotoxic medications, contrast dyes, hypotension  renally dose medications -Repeat CMP in a.m.   Essential Hypertension -Home regimen includes losartan 100 mg p.o. daily, metoprolol tartrate 25 mg p.o. twice daily. -Restarted home Metoprolol 25 mg p.o. twice daily and Losartan 100 mg p.o. daily after allowing permissive hypertension over the last previous 48 hours -Continue with hydralazine 25 mg p.o. q6h prn SBP >220 or DBP >110 -C/w Labetalol 10 mg q2h as needed for SBP >220 or DBP>110 -Continue to Monitor BP per Protocol -Last BP was 160/73   Hyperlipidemia -Lipid Panel done and showed a total cholesterol/HDL ratio 6.3, cholesterol level 227, HDL 36, LDL 167, triglycerides 120, VLDL 24 -Goal HDL less than 70 with acute CVA. -Continue Holding Atorvastatin until LFTs normalize   Type 2 Diabetes Mellitus -Hemoglobin A1c  was checked and 6.6 on 11/16/20, well controlled.  On metformin 500 mg p.o. daily at home. -Hold oral hypoglycemics while inpatient -C/w Sensitive Novolog SSI AC for coverage -CBG's ranging from 90-201 -CBG before every meal/at bedtime   Depression and Anxiety -Continue Sertraline 25 mg p.o. daily  Insomnia -C/w Melatonin 3 mg po qHS and Trazodone 25 mg po qHS  GOC -DNR, poA  DVT prophylaxis: Enoxaparin 30 mg sq q24h Code Status: DO NOT RESUSCITATE Family Communication: No family present at bedside Disposition Plan: CIR versus DC home versus SNF with eventual ALF placement  Status is: Inpatient  Remains inpatient appropriate because:Unsafe d/c plan, IV treatments appropriate due to intensity of illness or  inability to take PO, and Inpatient level of care appropriate due to severity of illness  Dispo: The patient is from: Home              Anticipated d/c is to:  TBD              Patient currently is not medically stable to d/c.   Difficult to place patient No  Consultants:  Neurology  Procedures:  EEG ABNORMALITY - Intermittent slow, generalized   IMPRESSION: This study is suggestive of mild  diffuse encephalopathy, nonspecific etiology. No seizures or epileptiform discharges were seen throughout the recording.    Antimicrobials:  Anti-infectives (From admission, onward)    Start     Dose/Rate Route Frequency Ordered Stop   11/19/20 1200  cephALEXin (KEFLEX) capsule 500 mg        500 mg Oral Every 12 hours 11/19/20 1052 11/22/20 0959   11/17/20 1600  cefTRIAXone (ROCEPHIN) 1 g in sodium chloride 0.9 % 100 mL IVPB  Status:  Discontinued        1 g 200 mL/hr over 30 Minutes Intravenous Every 24 hours 11/16/20 1934 11/19/20 1052   11/16/20 1545  cefTRIAXone (ROCEPHIN) 1 g in sodium chloride 0.9 % 100 mL IVPB        1 g 200 mL/hr over 30 Minutes Intravenous  Once 11/16/20 1543 11/16/20 1747        Subjective: Seen and examined at bedside and was still little confused.  Had no complaints but thought to more walking on the ceiling last night.  No other concerns or complaints at this time and doing okay.  Objective: Vitals:   11/20/20 0445 11/20/20 0510 11/20/20 0622 11/20/20 0729  BP: (!) 228/96  (!) 200/58 (!) 199/49  Pulse: (!) 44 (!) 51 (!) 50 (!) 51  Resp: 16   18  Temp:    98 F (36.7 C)  TempSrc:      SpO2: 100%  98%   Weight:      Height:        Intake/Output Summary (Last 24 hours) at 11/20/2020 9604 Last data filed at 11/19/2020 1929 Gross per 24 hour  Intake 177 ml  Output 900 ml  Net -723 ml   Filed Weights   11/16/20 1208 11/17/20 1819  Weight: 61.7 kg 61.7 kg   Examination: Physical Exam:  Constitutional: Overweight elderly Caucasian female currently in no acute distress appears calm Eyes: Lids and conjunctivae normal, sclerae anicteric  ENMT: External Ears, Nose appear normal. Grossly normal hearing.  Neck: Appears normal, supple, no cervical masses, normal ROM, no appreciable thyromegaly; no JVD Respiratory: Diminished to auscultation bilaterally, no wheezing, rales, rhonchi or crackles. Normal respiratory effort and patient is not tachypenic.  No accessory muscle use.  Unlabored breathing Cardiovascular: RRR, no murmurs / rubs / gallops. S1 and S2 auscultated.  Trace extremity edema Abdomen: Soft, non-tender, non-distended. Bowel sounds positive.  GU: Deferred. Musculoskeletal: No clubbing / cyanosis of digits/nails. No joint deformity upper and lower extremities.   Skin: No rashes, lesions, ulcers on a limited skin evaluation. No induration; Warm and dry.  Neurologic: CN 2-12 grossly intact with no focal deficits. Romberg sign cerebellar reflexes not assessed.  Psychiatric: Impaired judgment and insight. Alert and oriented x 1. Normal mood and appropriate affect.   Data Reviewed: I have personally reviewed following labs and imaging studies  CBC: Recent Labs  Lab 11/16/20 1349  WBC 7.4  NEUTROABS 5.9  HGB 15.2*  HCT 45.9  MCV 93.7  PLT 244   Basic Metabolic Panel: Recent Labs  Lab 11/16/20 1349 11/17/20 0527 11/18/20 0402 11/19/20 0603 11/20/20 0320  NA 138 137 137 137 137  K 3.7 3.5 3.4* 3.8 4.4  CL 100 101 103 103 105  CO2 29 27 24 26 27   GLUCOSE 149* 141* 160* 139* 140*  BUN 16 18 24* 25* 20  CREATININE 1.43* 1.41* 1.60* 1.59* 1.48*  CALCIUM 9.7 9.3 9.0 8.9 8.9  MG  --   --  2.2 2.2 2.1   GFR: Estimated Creatinine Clearance: 25.7 mL/min (A) (by C-G formula based on SCr of 1.48 mg/dL (H)). Liver Function Tests: Recent Labs  Lab 11/16/20 1349 11/18/20 0402 11/20/20 0320  AST 43* 104* 100*  ALT 36 80* 96*  ALKPHOS 158* 187* 223*  BILITOT 0.9 0.6 0.4  PROT 7.7 5.7* 5.2*  ALBUMIN 4.1 2.8* 2.8*   No results for input(s): LIPASE, AMYLASE in the last 168 hours. Recent Labs  Lab 11/17/20 0525  AMMONIA 10   Coagulation Profile: Recent Labs  Lab 11/16/20 1349  INR 0.9   Cardiac Enzymes: No results for input(s): CKTOTAL, CKMB, CKMBINDEX, TROPONINI in the last 168 hours. BNP (last 3 results) No results for input(s): PROBNP in the last 8760 hours. HbA1C: No results for input(s): HGBA1C in the  last 72 hours. CBG: Recent Labs  Lab 11/19/20 0809 11/19/20 1206 11/19/20 1620 11/19/20 2122 11/20/20 0600  GLUCAP 218* 90 146* 201* 136*   Lipid Profile: No results for input(s): CHOL, HDL, LDLCALC, TRIG, CHOLHDL, LDLDIRECT in the last 72 hours. Thyroid Function Tests: No results for input(s): TSH, T4TOTAL, FREET4, T3FREE, THYROIDAB in the last 72 hours. Anemia Panel: No results for input(s): VITAMINB12, FOLATE, FERRITIN, TIBC, IRON, RETICCTPCT in the last 72 hours. Sepsis Labs: No results for input(s): PROCALCITON, LATICACIDVEN in the last 168 hours.  Recent Results (from the past 240 hour(s))  Resp Panel by RT-PCR (Flu A&B, Covid) Nasopharyngeal Swab     Status: None   Collection Time: 11/16/20  1:54 PM   Specimen: Nasopharyngeal Swab; Nasopharyngeal(NP) swabs in vial transport medium  Result Value Ref Range Status   SARS Coronavirus 2 by RT PCR NEGATIVE NEGATIVE Final    Comment: (NOTE) SARS-CoV-2 target nucleic acids are NOT DETECTED.  The SARS-CoV-2 RNA is generally detectable in upper respiratory specimens during the acute phase of infection. The lowest concentration of SARS-CoV-2 viral copies this assay can detect is 138 copies/mL. A negative result does not preclude SARS-Cov-2 infection and should not be used as the sole basis for treatment or other patient management decisions. A negative result may occur with  improper specimen collection/handling, submission of specimen other than nasopharyngeal swab, presence of viral mutation(s) within the areas targeted by this assay, and inadequate number of viral copies(<138 copies/mL). A negative result must be combined with clinical observations, patient history, and epidemiological information. The expected result is Negative.  Fact Sheet for Patients:  01/17/21  Fact Sheet for Healthcare Providers:  BloggerCourse.com  This test is no t yet approved or cleared  by the [place] FDA and  has been authorized for detection and/or diagnosis of SARS-CoV-2 by FDA under an Emergency Use Authorization (EUA). This EUA will remain  in effect (meaning this test can be used) for the duration of the COVID-19 declaration under Section 564(b)(1) of the Act, 21 U.S.C.section 360bbb-3(b)(1), unless the authorization is terminated  or revoked sooner.       Influenza A by PCR NEGATIVE NEGATIVE  Final   Influenza B by PCR NEGATIVE NEGATIVE Final    Comment: (NOTE) The Xpert Xpress SARS-CoV-2/FLU/RSV plus assay is intended as an aid in the diagnosis of influenza from Nasopharyngeal swab specimens and should not be used as a sole basis for treatment. Nasal washings and aspirates are unacceptable for Xpert Xpress SARS-CoV-2/FLU/RSV testing.  Fact Sheet for Patients: BloggerCourse.com  Fact Sheet for Healthcare Providers: SeriousBroker.it  This test is not yet approved or cleared by the [place] FDA and has been authorized for detection and/or diagnosis of SARS-CoV-2 by FDA under an Emergency Use Authorization (EUA). This EUA will remain in effect (meaning this test can be used) for the duration of the COVID-19 declaration under Section 564(b)(1) of the Act, 21 U.S.C. section 360bbb-3(b)(1), unless the authorization is terminated or revoked.  Performed at [facility], [address] [place], [place] [address]   Urine culture     Status: Abnormal   Collection Time: 11/16/20  2:40 PM   Specimen: Urine, Clean Catch  Result Value Ref Range Status   Specimen Description   Final    URINE, CLEAN CATCH Performed at [facility], [address] [place], [place] [address]    Special Requests   Final    NONE Performed at [facility], [address], [place], [place] [address]    Culture >=100,000 COLONIES/mL ESCHERICHIA COLI (A)  Final    Report Status 11/19/2020 FINAL  Final   Organism ID, Bacteria ESCHERICHIA COLI (A)  Final      Susceptibility   Escherichia coli - MIC*    AMPICILLIN >=32 RESISTANT Resistant     CEFAZOLIN <=4 SENSITIVE Sensitive     CEFEPIME <=0.12 SENSITIVE Sensitive     CEFTRIAXONE <=0.25 SENSITIVE Sensitive     CIPROFLOXACIN >=4 RESISTANT Resistant     GENTAMICIN <=1 SENSITIVE Sensitive     IMIPENEM <=0.25 SENSITIVE Sensitive     NITROFURANTOIN <=16 SENSITIVE Sensitive     TRIMETH/SULFA >=320 RESISTANT Resistant     AMPICILLIN/SULBACTAM 16 INTERMEDIATE Intermediate     PIP/TAZO <=4 SENSITIVE Sensitive     * >=100,000 COLONIES/mL ESCHERICHIA COLI     RN Pressure Injury Documentation:     Estimated body mass index is 26.56 kg/m as calculated from the following:   Height as of this encounter: 5' (1.524 m).   Weight as of this encounter: 61.7 kg.  Malnutrition Type:   Malnutrition Characteristics:   Nutrition Interventions:    Radiology Studies: EEG adult  Result Date: 12/02/2020 Charlsie Quest, MD     2020-12-02 10:57 AM Patient Name: RUTHE ROEMER MRN: [id number] Epilepsy Attending: Charlsie Quest Referring Physician/Provider: Jimmye Norman, NP Date: 12-02-2020 Duration: 23.12 mins Patient history: 80 year old female with COVID 3 weeks ago who was found confused on the floor with nonsensical speech.  EEG to evaluate for seizures. Level of alertness: Awake AEDs during EEG study: None Technical aspects: This EEG study was done with scalp electrodes positioned according to the 10-20 International system of electrode placement. Electrical activity was acquired at a sampling rate of  and reviewed with a high frequency filter of  and a low frequency filter of . EEG data were recorded continuously and digitally stored. Description: The posterior dominant rhythm consists of 10 Hz activity of moderate voltage (25-35 uV) seen predominantly in posterior head regions, symmetric and  reactive to eye opening and eye closing. EEG showed intermittent generalized 3 to 6 Hz theta-delta slowing. Hyperventilation and  photic stimulation were not performed.   ABNORMALITY - Intermittent slow, generalized IMPRESSION: This study is suggestive of mild diffuse encephalopathy, nonspecific etiology. No seizures or epileptiform discharges were seen throughout the recording. Priyanka Annabelle Harman Yadav    Scheduled Meds:   stroke: mapping our early stages of recovery book   Does not apply Once   aspirin EC  325 mg Oral Daily   cephALEXin  500 mg Oral Q12H   clopidogrel  75 mg Oral Daily   enoxaparin (LOVENOX) injection  30 mg Subcutaneous Q24H   insulin aspart  0-9 Units Subcutaneous TID WC   losartan  100 mg Oral Daily   melatonin  3 mg Oral QHS   metoprolol tartrate  25 mg Oral BID   sertraline  25 mg Oral Daily   traZODone  25 mg Oral QHS   Continuous Infusions:   LOS: 3 days   Merlene Laughtermair Latif Claron Rosencrans, DO [facility] PAGER is on AMION  If 7PM-7AM, please contact night-coverage [email or web address]

## 2020-11-21 LAB — COMPREHENSIVE METABOLIC PANEL
ALT: 92 U/L — ABNORMAL HIGH (ref 0–44)
AST: 75 U/L — ABNORMAL HIGH (ref 15–41)
Albumin: 3.1 g/dL — ABNORMAL LOW (ref 3.5–5.0)
Alkaline Phosphatase: 226 U/L — ABNORMAL HIGH (ref 38–126)
Anion gap: 6 (ref 5–15)
BUN: 27 mg/dL — ABNORMAL HIGH (ref 8–23)
CO2: 29 mmol/L (ref 22–32)
Calcium: 9 mg/dL (ref 8.9–10.3)
Chloride: 100 mmol/L (ref 98–111)
Creatinine, Ser: 1.66 mg/dL — ABNORMAL HIGH (ref 0.44–1.00)
GFR, Estimated: 31 mL/min — ABNORMAL LOW (ref >60)
Glucose, Bld: 234 mg/dL — ABNORMAL HIGH (ref 70–99)
Potassium: 4.1 mmol/L (ref 3.5–5.1)
Sodium: 135 mmol/L (ref 135–145)
Total Bilirubin: 0.4 mg/dL (ref 0.3–1.2)
Total Protein: 6.1 g/dL — ABNORMAL LOW (ref 6.5–8.1)

## 2020-11-21 LAB — CBC WITH DIFFERENTIAL/PLATELET
Abs Immature Granulocytes: 0.02 10*3/uL (ref 0.00–0.07)
Basophils Absolute: 0.1 10*3/uL (ref 0.0–0.1)
Basophils Relative: 1 %
Eosinophils Absolute: 0.6 10*3/uL — ABNORMAL HIGH (ref 0.0–0.5)
Eosinophils Relative: 10 %
HCT: 40.4 % (ref 36.0–46.0)
Hemoglobin: 13.3 g/dL (ref 12.0–15.0)
Immature Granulocytes: 0 %
Lymphocytes Relative: 21 %
Lymphs Abs: 1.1 10*3/uL (ref 0.7–4.0)
MCH: 31.1 pg (ref 26.0–34.0)
MCHC: 32.9 g/dL (ref 30.0–36.0)
MCV: 94.4 fL (ref 80.0–100.0)
Monocytes Absolute: 0.4 10*3/uL (ref 0.1–1.0)
Monocytes Relative: 7 %
Neutro Abs: 3.3 10*3/uL (ref 1.7–7.7)
Neutrophils Relative %: 61 %
Platelets: 226 10*3/uL (ref 150–400)
RBC: 4.28 MIL/uL (ref 3.87–5.11)
RDW: 13.4 % (ref 11.5–15.5)
WBC: 5.4 10*3/uL (ref 4.0–10.5)
nRBC: 0 % (ref 0.0–0.2)

## 2020-11-21 LAB — GLUCOSE, CAPILLARY
Glucose-Capillary: 111 mg/dL — ABNORMAL HIGH (ref 70–99)
Glucose-Capillary: 167 mg/dL — ABNORMAL HIGH (ref 70–99)
Glucose-Capillary: 110 mg/dL — ABNORMAL HIGH (ref 70–99)
Glucose-Capillary: 199 mg/dL — ABNORMAL HIGH (ref 70–99)

## 2020-11-21 LAB — PHOSPHORUS: Phosphorus: 3.5 mg/dL (ref 2.5–4.6)

## 2020-11-21 LAB — MAGNESIUM: Magnesium: 2.1 mg/dL (ref 1.7–2.4)

## 2020-11-21 MED ORDER — HYDRALAZINE HCL 25 MG PO TABS
25.0000 mg | ORAL_TABLET | Freq: Four times a day (QID) | ORAL | Status: DC | PRN
  Administered 2020-11-21: 25 mg via ORAL
  Filled 2020-11-21: qty 1

## 2020-11-21 MED ORDER — LABETALOL HCL 5 MG/ML IV SOLN: 10.0000 mg | INTRAVENOUS | Status: DC | PRN

## 2020-11-21 NOTE — Progress Notes (Signed)
Inpatient Rehabilitation Admissions Coordinator   I met with patient , her daughter, Amy and Venita Lick, Care Portal placement liaison. We discussed patient options and need for further rehab with the ultimate goal for her to be placed at ALF level. Final decision was to pursue SNF rehab to transition to ALF after SNF . I have updated Kathlee Nations, SW with TOC and we will sign off at this time.  Danne Baxter, RN, MSN Rehab Admissions Coordinator [phone] 11/21/2020 2:49 PM

## 2020-11-21 NOTE — NC FL2 (Signed)
MEDICAID FL2 LEVEL OF CARE SCREENING TOOL     IDENTIFICATION  Patient Name: Heather [name] Birthdate: August 11, 1940 Sex: female Admission Date (Current Location): 11/16/2020  [facility] and [place] Number:  Producer, television/film/video and Address:  [facility], [address], [place], [place] [address]      Provider Number: [id number]  Attending Physician Name and Address:  Merlene Laughter, DO  Relative Name and Phone Number:       Current Level of Care: Hospital Recommended Level of Care: Skilled Nursing Facility Prior Approval Number:    Date Approved/Denied:   PASRR Number: [id number]  Discharge Plan: SNF    Current Diagnoses: Patient Active Problem List   Diagnosis Date Noted   Acute CVA (cerebrovascular accident) (HCC) 11/16/2020   Urinary tract infection 11/16/2020   CKD (chronic kidney disease), stage III (HCC) 11/16/2020   Essential hypertension 02/13/2020   Hyperlipidemia LDL goal <100 02/13/2020   Type 2 diabetes mellitus without complication, without long-term current use of insulin (HCC) 02/13/2020   History of stroke 2021    Orientation RESPIRATION BLADDER Height & Weight     Self, Time, Situation, Place  Normal Incontinent Weight: 136 lb (61.7 kg) Height:  5' (152.4 cm)  BEHAVIORAL SYMPTOMS/MOOD NEUROLOGICAL BOWEL NUTRITION STATUS      Continent Diet  AMBULATORY STATUS COMMUNICATION OF NEEDS Skin   Limited Assist Verbally Normal                       Personal Care Assistance Level of Assistance  Bathing, Feeding, Dressing Bathing Assistance: Limited assistance Feeding assistance: Independent Dressing Assistance: Limited assistance     Functional Limitations Info             SPECIAL CARE FACTORS FREQUENCY  PT (By licensed PT), OT (By licensed OT)     PT Frequency: 5x/wk OT Frequency: 5x/wk            Contractures Contractures Info: Not present    Additional Factors Info  Code Status,  Allergies, Psychotropic Code Status Info: DNR Allergies Info: Iohexol, Iohexol Psychotropic Info: Zoloft 25mg  daily         Current Medications (11/21/2020):  This is the current hospital active medication list Current Facility-Administered Medications  Medication Dose Route Frequency Provider Last Rate Last Admin    stroke: mapping our early stages of recovery book   Does not apply Once 11/23/2020, MD       acetaminophen (TYLENOL) tablet 650 mg  650 mg Oral Q4H PRN Orland Mustard, MD   650 mg at 11/19/20 1039   Or   acetaminophen (TYLENOL) 160 MG/5ML solution 650 mg  650 mg Per Tube Q4H PRN 01/20/21, MD       Or   acetaminophen (TYLENOL) suppository 650 mg  650 mg Rectal Q4H PRN Orland Mustard, MD       aspirin EC tablet 325 mg  325 mg Oral Daily Orland Mustard, MD   325 mg at 11/21/20 0802   cephALEXin (KEFLEX) capsule 500 mg  500 mg Oral Q12H 11/23/20, [place], DO   500 mg at 11/21/20 11/23/20   clopidogrel (PLAVIX) tablet 75 mg  75 mg Oral Daily 1700, NP   75 mg at 11/21/20 0803   enoxaparin (LOVENOX) injection 30 mg  30 mg Subcutaneous Q24H 11/23/20, MD   30 mg at 11/20/20 2057   hydrALAZINE (APRESOLINE) tablet 25 mg  25 mg Oral  Q6H PRN [place], Alvira Philips, DO       insulin aspart (novoLOG) injection 0-9 Units  0-9 Units Subcutaneous TID WC Orland Mustard, MD   2 Units at 11/21/20 1232   labetalol (NORMODYNE) injection 10 mg  10 mg Intravenous Q2H PRN [place], Eric J, DO   10 mg at 11/18/20 1823   LORazepam (ATIVAN) injection 0.5 mg  0.5 mg Intravenous Once PRN Arby Barrette, MD       losartan (COZAAR) tablet 100 mg  100 mg Oral Daily [place], Alvira Philips, DO   100 mg at 11/21/20 0802   melatonin tablet 3 mg  3 mg Oral QHS [place], Alvira Philips, DO   3 mg at 11/20/20 2058   metoprolol tartrate (LOPRESSOR) tablet 25 mg  25 mg Oral BID [place], Eric J, DO   25 mg at 11/21/20 0803   ondansetron (ZOFRAN) injection 4 mg  4 mg Intravenous Q6H PRN [place], Eric J, DO   4 mg at  11/19/20 1039   senna-docusate (Senokot-S) tablet 1 tablet  1 tablet Oral QHS PRN Orland Mustard, MD       sertraline (ZOLOFT) tablet 25 mg  25 mg Oral Daily Orland Mustard, MD   25 mg at 11/21/20 0803   traZODone (DESYREL) tablet 25 mg  25 mg Oral QHS [place], Eric J, DO   25 mg at 11/20/20 2057     Discharge Medications: Please see discharge summary for a list of discharge medications.  Relevant Imaging Results:  Relevant Lab Results:   Additional Information SS#: [id number]  Baldemar Lenis, LCSW

## 2020-11-21 NOTE — Progress Notes (Signed)
Physical Therapy Treatment Patient Details Name: Heather [name] MRN: [id number] DOB: June 15, 1940 Today's Date: 11/21/2020    History of Present Illness Pt is a 80 y/o female who presents with AMS. Daughter found pt initially with pajama top buttoned around her leg, and later face down on the floor with clothing disheveled after an unwitnessed fall. 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 PMH significant for DM, CVA 2021, HTN.    PT Comments    Patient progressing towards physical therapy goals. Patient ambulated with RW 200' with minA for RW management and balance. Patient demonstrates decreased insight into deficits and disoriented to situation. Patient with STM deficits requiring cues for wayfinding back to room. Patient required frequent redirection by daughter and this therapist to maintain attention on tasks. Updated d/c plan to SNF as patient and family plan to transition from SNF to ALF in future.     Follow Up Recommendations  SNF;Supervision for mobility/OOB     Equipment Recommendations  None recommended by PT    Recommendations for Other Services       Precautions / Restrictions Precautions Precautions: Fall Restrictions Weight Bearing Restrictions: No    Mobility  Bed Mobility Overal bed mobility: Needs Assistance Bed Mobility: Sit to Supine       Sit to supine: Min guard   General bed mobility comments: returned to bed at end of session    Transfers Overall transfer level: Needs assistance Equipment used: Rolling [name] (2 wheeled) Transfers: Sit to/from Stand Sit to Stand: Min assist         General transfer comment: assist to power up into standing and steady.  Ambulation/Gait Ambulation/Gait assistance: Min assist;Min guard Gait Distance (Feet): 200 Feet Assistive device: Rolling [name] (2  wheeled) Gait Pattern/deviations: Step-through pattern;Decreased stride length;Drifts right/left;Trunk flexed;Narrow base of support Gait velocity: decreased   General Gait Details: Requires assist and cueing with RW management during turns. Cues for wayfinding back to room and allowed patient to view sign to determine direction towards room   Stairs             Wheelchair Mobility    Modified Rankin (Stroke Patients Only) Modified Rankin (Stroke Patients Only) Pre-Morbid Rankin Score: Slight disability Modified Rankin: Moderately severe disability     Balance Overall balance assessment: Needs assistance Sitting-balance support: Feet supported;No upper extremity supported Sitting balance-Leahy Scale: Fair     Standing balance support: Bilateral upper extremity supported;During functional activity Standing balance-Leahy Scale: Poor Standing balance comment: reliant on UE support and external assist                            Cognition Arousal/Alertness: Awake/alert Behavior During Therapy: WFL for tasks assessed/performed Overall Cognitive Status: Impaired/Different from baseline Area of Impairment: Orientation;Attention;Memory;Following commands;Safety/judgement;Awareness;Problem solving                 Orientation Level: Disoriented to;Situation Current Attention Level: Sustained Memory: Decreased short-term memory Following Commands: Follows one step commands consistently;Follows one step commands with increased time Safety/Judgement: Decreased awareness of safety;Decreased awareness of deficits Awareness: Emergent Problem Solving: Difficulty sequencing;Requires verbal cues;Slow processing General Comments: Decreased STM requiring redirection due to perseverating on tasks not currently participating in. Decreased insight into deficits or need for assistance for safety      Exercises      General Comments  Pertinent Vitals/Pain Pain  Assessment: No/denies pain    Home Living                      Prior Function            PT Goals (current goals can now be found in the care plan section) Acute Rehab PT Goals Patient Stated Goal: to walk more PT Goal Formulation: Patient unable to participate in goal setting Time For Goal Achievement: 12/02/20 Potential to Achieve Goals: Good Progress towards PT goals: Progressing toward goals    Frequency    Min 3X/week      PT Plan Discharge plan needs to be updated;Frequency needs to be updated    Co-evaluation              AM-PAC PT "6 Clicks" Mobility   Outcome Measure  Help needed turning from your back to your side while in a flat bed without using bedrails?: A Little Help needed moving from lying on your back to sitting on the side of a flat bed without using bedrails?: A Little Help needed moving to and from a bed to a chair (including a wheelchair)?: A Little Help needed standing up from a chair using your arms (e.g., wheelchair or bedside chair)?: A Little Help needed to walk in hospital room?: A Little Help needed climbing 3-5 steps with a railing? : A Little 6 Click Score: 18    End of Session Equipment Utilized During Treatment: Gait belt Activity Tolerance: Patient tolerated treatment well Patient left: in bed;with call bell/phone within reach;with bed alarm set Nurse Communication: Mobility status PT Visit Diagnosis: Unsteadiness on feet (R26.81);Muscle weakness (generalized) (M62.81)     Time: 8527-7824 PT Time Calculation (min) (ACUTE ONLY): 30 min  Charges:  $Gait Training: 23-37 mins                     Agape Hardiman A. Dan Humphreys PT, DPT Acute Rehabilitation Services Pager [phone] Office [phone]    Viviann Spare 11/21/2020, 5:21 PM

## 2020-11-21 NOTE — Progress Notes (Addendum)
PROGRESS NOTE    Heather [name]  ZOX:[id number] DOB: 02-04-1941 DOA: 11/16/2020 PCP: Caesar Bookman, NP   Brief Narrative:  The patient is a 80 year old Caucasian female with a past medical history significant for but not limited to essential hypertension, hyperlipidemia, history of ischemic CVA in 2021, diabetes mellitus type 2 as well as other comorbidities who presented to [facility] long ED on 11/16/2020 after a fall and speech and cognitive issues over the last few weeks.  Daughter was at bedside and contributed to the HPI and stated that her mother had not been acting right after being diagnosed with COVID-19 3 weeks prior.  Patient's daughter states that the patient stating that did not make sense and apparently had a short that was buttoned around her leg last night.  The patient's daughter found the patient in her closet on the floor with her short half-life and her pants down around her knees and unclear if his syncopal episode.  Daughter did not notice any weakness or facial droop but reported the patient did have some memory issues since her ischemic stroke yesterday.  Patient has reportedly increased urinary frequency and urgency but no dysuria.  Pulmonary febrile to the ED she had basic blood work done and urinalysis showed large leukocytes, negative nitrites, many bacteria and greater than 50 WBCs.  She is empirically started on antibiotics and head CT done showed no acute intracranial pathology but she underwent a MRI of the brain which showed acute infarct of the juxtacortical right frontal lobe with possible subacute infarcts as well.  Neurology was consulted and they recommended dual antiplatelet therapy.  TRH was consulted and admitted this patient for acute CVA and UTI.  Subsequently she improved from a neuro standpoint and was recommended for dual antiplatelet therapy for 3 months.  Patient still little confused however PT OT recommending CIR.  CIR working the patient up and is unclear of the  patient's disposition at this time but they have arranging a meeting with the patient's daughter long-term placement liaison to discuss plans for rehab venue options this afternoon.  Assessment & Plan:   Principal Problem:   Acute CVA (cerebrovascular accident) [facility]) Active Problems:   Essential hypertension   Hyperlipidemia LDL goal <100   Type 2 diabetes mellitus without complication, without long-term current use of insulin (HCC)   Urinary tract infection   CKD (chronic kidney disease), stage III (HCC)  Acute metabolic encephalopathy, POA  -Family reports patient has been acting more confused over the last 3 weeks, after recent diagnosis of COVID-19 viral infection.   -Also reported patient with memory issues since previous ischemic CVA 2021.   -Patient was found down at home with reports of her shirt buttoned onto her leg.   -Ammonia level within normal limits, patient afebrile without leukocytosis.   -TSH 2.553, within normal limits.  -B12 slightly low at 391.   -Head imaging with acute and subacute infarcts as well as urinalysis suggestive of a urinary tract infection. -Continue treatment as below -Delirium Precautions -Supportive care; continues to be intermittently confused but easily reoriented   Acute ischemic CVA  -Patient was [facility] ED after being found down at home by family.  Per family report, patient has not been acting right with worsening confusion over the last 3 weeks; following recent diagnosis of COVID-19 viral infection.   -CT head without contrast with no acute intracranial findings but notable for advanced small vessel white matter disease and encephalomalacia of the right caudate/corona radiata  consistent with prior CVA.   -MR brain with acute infarct just a cortical right frontal lobe with subacute infarct left corpus callosum/occipital lobe.   -MRA head/neck with advanced intracranial atherosclerosis with multifocal high-grade narrowing which is  progressed from 2021 and chronically diminished flow left vertebral artery due to none resolved proximal stenosis.   -Hemoglobin A1c 6.6, LDL 167.   -TTE with LVEF 60-75% grade 1 diastolic dysfunction.   -EEG with diffuse mild encephalopathy nonspecific without seizure or epileptiform discharges. -Neurologywas following, appreciate assistance -Restarted metoprolol tartrate 25 mg p.o. twice daily and losartan 100 mg p.o. daily after allowing permissive hypertension for 48 hours -Per Neuro Recc's C/w DAPT w/ Plavix 75mg  daily and aspirin 325 mg daily x 3 Months days followed by aspirin alone -Currently Holding atorvastatin until LFTs normalize -PT/OT recommending CIR -Continue monitor on Telemetry    E. coli urinary tract infection -Patient reported increased urinary frequency/urgency without dysuria.   -Urinalysis with cloudy appearance and small hemoglobin on urine dipstick, large leukocytes, negative nitrites, many bacteria, greater than 50 RBCs per high-power field, 0-5 squamous epithelial cells, and greater than 50 WBCs -This could also be a contributing factor to her worsening confusion over the last 3 weeks.   -Urine culture with > 100,000 CFU of E Coli with resistance to TMP-SMX, ciprofloxacin, and ampicillin with intermediate sensitivities to ampicillin/sulbactam with sensitivities to the rest -Transitioned IV Ceftriaxone to Keflex based on culture susceptibilities to complete 5-day course   Abnormal LFTs -AST went from 43 -> 104 -> 100 and is now improved to 75 -ALT went from 36 -> 80 -> 96 ALT 92 -Unclear etiology, possible mild acute liver injury from unknown downtime at home.  Given need for increased statin dose for CVA, will trend LFTs. -T Bili was normal at 0.4 -If continues to worsen or not improve will obtain RUQ U/S and Acute Hepatitis Panel -Continue to Monitor and Trend Hepatic Fxn Panel and repeat CMP in the AM  -Resume statin once LFTs are close to normal    Hypokalemia -Improved.  Patient's potassium is now 4.1 and magnesium is 2.1 -Continue to monitor and replete as necessary -Repeat CMP in a.m.  CKD Stage 3b -Stable. Patient's BUN/Cr is improved and went from 24/1.60 -> 25/1.59 -> 20/1.48 and is now slightly worse today at 27/1.66 -Avoid nephrotoxic medications, contrast dyes, hypotension renally dose medications -Repeat CMP in a.m.   Essential Hypertension -Home regimen includes losartan 100 mg p.o. daily, metoprolol tartrate 25 mg p.o. twice daily. -Restarted home Metoprolol 25 mg p.o. twice daily and Losartan 100 mg p.o. daily after allowing permissive hypertension over the last previous 48 hours -Continue with hydralazine 25 mg p.o. q6h prn SBP >220 or DBP >110 -C/w Labetalol 10 mg q2h as needed for SBP >220 or DBP>110 -Continue to Monitor BP per Protocol -Last BP was 150/87   Hyperlipidemia -Lipid Panel done and showed a total cholesterol/HDL ratio 6.3, cholesterol level 227, HDL 36, LDL 167, triglycerides 120, VLDL 24 -Goal HDL less than 70 with acute CVA. -Continue Holding Atorvastatin until LFTs normalize   Type 2 Diabetes Mellitus -Hemoglobin A1c  was checked and 6.6 on 11/16/20, well controlled.  On metformin 500 mg p.o. daily at home. -Hold oral hypoglycemics while inpatient -C/w Sensitive Novolog SSI AC for coverage -CBG's ranging from 110-197 -CBG before every meal/at bedtime   Depression and Anxiety -Continue Sertraline 25 mg p.o. daily  Insomnia -C/w Melatonin 3 mg po qHS and Trazodone 25 mg po qHS  GOC -DNR, poA  DVT prophylaxis: Enoxaparin 30 mg sq q24h Code Status: DO NOT RESUSCITATE Family Communication: No family present at bedside Disposition Plan: CIR versus DC home versus SNF with eventual ALF placement.  There is going to be a meeting today this afternoon for long-term rehab options for this patient  Status is: Inpatient  Remains inpatient appropriate because:Unsafe d/c plan, IV treatments  appropriate due to intensity of illness or inability to take PO, and Inpatient level of care appropriate due to severity of illness  Dispo: The patient is from: Home              Anticipated d/c is to:  TBD              Patient currently is not medically stable to d/c.   Difficult to place patient No  Consultants:  Neurology  Procedures:  EEG ABNORMALITY - Intermittent slow, generalized   IMPRESSION: This study is suggestive of mild diffuse encephalopathy, nonspecific etiology. No seizures or epileptiform discharges were seen throughout the recording.    Antimicrobials:  Anti-infectives (From admission, onward)    Start     Dose/Rate Route Frequency Ordered Stop   11/19/20 1200  cephALEXin (KEFLEX) capsule 500 mg        500 mg Oral Every 12 hours 11/19/20 1052 11/22/20 0959   11/17/20 1600  cefTRIAXone (ROCEPHIN) 1 g in sodium chloride 0.9 % 100 mL IVPB  Status:  Discontinued        1 g 200 mL/hr over 30 Minutes Intravenous Every 24 hours 11/16/20 1934 11/19/20 1052   11/16/20 1545  cefTRIAXone (ROCEPHIN) 1 g in sodium chloride 0.9 % 100 mL IVPB        1 g 200 mL/hr over 30 Minutes Intravenous  Once 11/16/20 1543 11/16/20 1747        Subjective: Seen and examined at bedside and was still confused and did not recognize me as her physician.  She had no complaints overnight.  Was a little agitated.  No chest pain or shortness breath.  No other concerns or complaints at this time and patient's family to meet with the rehab coordinator today at 230  Objective: Vitals:   11/20/20 2344 11/21/20 0337 11/21/20 0744 11/21/20 1157  BP: (!) 167/54 (!) 171/61 (!) 182/57 (!) 150/87  Pulse: 63 (!) 54 (!) 55 (!) 57  Resp: Temp: 98.4 F (36.9 C) (!) 97.5 F (36.4 C) 98.1 F (36.7 C) 98.2 F (36.8 C)  TempSrc: Oral Oral Oral Oral  SpO2: 96% 100% 99% 99%  Weight:      Height:        Intake/Output Summary (Last 24 hours) at 11/21/2020 1345 Last data filed at 11/21/2020  1012 Gross per 24 hour  Intake 120 ml  Output 925 ml  Net -805 ml    Filed Weights   11/16/20 1208 11/17/20 1819  Weight: 61.7 kg 61.7 kg   Examination: Physical Exam:  Constitutional: Overweight elderly Caucasian female currently in no acute distress appears calm  Eyes: Lids and conjunctivae normal, sclerae anicteric  ENMT: External Ears, Nose appear normal. Grossly normal hearing. Neck: Appears normal, supple, no cervical masses, normal ROM, no appreciable thyromegaly; no JVD Respiratory: Diminished to auscultation bilaterally, no wheezing, rales, rhonchi or crackles. Normal respiratory effort and patient is not tachypenic. No accessory muscle use.  Unlabored breathing Cardiovascular: RRR, no murmurs / rubs / gallops. S1 and S2 auscultated.  Trace extremity edema Abdomen: Soft, non-tender,  distended secondary body habitus. Bowel sounds positive.  GU: Deferred. Musculoskeletal: No clubbing / cyanosis of digits/nails. No joint deformity upper and lower extremities.  Skin: No rashes, lesions, ulcers on limited skin evaluation. No induration; Warm and dry.  Neurologic: CN 2-12 grossly intact with no focal deficits. Romberg sign and cerebellar reflexes not assessed.  Psychiatric: Impaired judgment and insight. Alert and oriented x 1. Normal mood and appropriate affect.   Data Reviewed: I have personally reviewed following labs and imaging studies  CBC: Recent Labs  Lab 11/16/20 1349 11/20/20 0915 11/21/20 0919  WBC 7.4 5.9 5.4  NEUTROABS 5.9 4.0 3.3  HGB 15.2* 13.6 13.3  HCT 45.9 41.3 40.4  MCV 93.7 95.4 94.4  PLT 244 236 226    Basic Metabolic Panel: Recent Labs  Lab 11/17/20 0527 11/18/20 0402 11/19/20 0603 11/20/20 0320 11/21/20 0919  NA 137 137 137 137 135  K 3.5 3.4* 3.8 4.4 4.1  CL 101 103 103 105 100  CO2 27 24 26 27 29   GLUCOSE 141* 160* 139* 140* 234*  BUN 18 24* 25* 20 27*  CREATININE 1.41* 1.60* 1.59* 1.48* 1.66*  CALCIUM 9.3 9.0 8.9 8.9 9.0  MG  --   2.2 2.2 2.1 2.1  PHOS  --   --   --   --  3.5    GFR: Estimated Creatinine Clearance: 22.9 mL/min (A) (by C-G formula based on SCr of 1.66 mg/dL (H)). Liver Function Tests: Recent Labs  Lab 11/16/20 1349 11/18/20 0402 11/20/20 0320 11/21/20 0919  AST 43* 104* 100* 75*  ALT 36 80* 96* 92*  ALKPHOS 158* 187* 223* 226*  BILITOT 0.9 0.6 0.4 0.4  PROT 7.7 5.7* 5.2* 6.1*  ALBUMIN 4.1 2.8* 2.8* 3.1*    No results for input(s): LIPASE, AMYLASE in the last 168 hours. Recent Labs  Lab 11/17/20 0525  AMMONIA 10    Coagulation Profile: Recent Labs  Lab 11/16/20 1349  INR 0.9    Cardiac Enzymes: No results for input(s): CKTOTAL, CKMB, CKMBINDEX, TROPONINI in the last 168 hours. BNP (last 3 results) No results for input(s): PROBNP in the last 8760 hours. HbA1C: No results for input(s): HGBA1C in the last 72 hours. CBG: Recent Labs  Lab 11/20/20 1218 11/20/20 1600 11/20/20 2147 11/21/20 0608 11/21/20 1201  GLUCAP 191* 110* 197* 111* 167*    Lipid Profile: No results for input(s): CHOL, HDL, LDLCALC, TRIG, CHOLHDL, LDLDIRECT in the last 72 hours. Thyroid Function Tests: No results for input(s): TSH, T4TOTAL, FREET4, T3FREE, THYROIDAB in the last 72 hours. Anemia Panel: No results for input(s): VITAMINB12, FOLATE, FERRITIN, TIBC, IRON, RETICCTPCT in the last 72 hours. Sepsis Labs: No results for input(s): PROCALCITON, LATICACIDVEN in the last 168 hours.  Recent Results (from the past 240 hour(s))  Resp Panel by RT-PCR (Flu A&B, Covid) Nasopharyngeal Swab     Status: None   Collection Time: 11/16/20  1:54 PM   Specimen: Nasopharyngeal Swab; Nasopharyngeal(NP) swabs in vial transport medium  Result Value Ref Range Status   SARS Coronavirus 2 by RT PCR NEGATIVE NEGATIVE Final    Comment: (NOTE) SARS-CoV-2 target nucleic acids are NOT DETECTED.  The SARS-CoV-2 RNA is generally detectable in upper respiratory specimens during the acute phase of infection. The  lowest concentration of SARS-CoV-2 viral copies this assay can detect is 138 copies/mL. A negative result does not preclude SARS-Cov-2 infection and should not be used as the sole basis for treatment or other patient management decisions. A negative result may  occur with  improper specimen collection/handling, submission of specimen other than nasopharyngeal swab, presence of viral mutation(s) within the areas targeted by this assay, and inadequate number of viral copies(<138 copies/mL). A negative result must be combined with clinical observations, patient history, and epidemiological information. The expected result is Negative.  Fact Sheet for Patients:  BloggerCourse.com  Fact Sheet for Healthcare Providers:  SeriousBroker.it  This test is no t yet approved or cleared by the [place] FDA and  has been authorized for detection and/or diagnosis of SARS-CoV-2 by FDA under an Emergency Use Authorization (EUA). This EUA will remain  in effect (meaning this test can be used) for the duration of the COVID-19 declaration under Section 564(b)(1) of the Act, 21 U.S.C.section 360bbb-3(b)(1), unless the authorization is terminated  or revoked sooner.       Influenza A by PCR NEGATIVE NEGATIVE Final   Influenza B by PCR NEGATIVE NEGATIVE Final    Comment: (NOTE) The Xpert Xpress SARS-CoV-2/FLU/RSV plus assay is intended as an aid in the diagnosis of influenza from Nasopharyngeal swab specimens and should not be used as a sole basis for treatment. Nasal washings and aspirates are unacceptable for Xpert Xpress SARS-CoV-2/FLU/RSV testing.  Fact Sheet for Patients: BloggerCourse.com  Fact Sheet for Healthcare Providers: SeriousBroker.it  This test is not yet approved or cleared by the [place] FDA and has been authorized for detection and/or diagnosis of SARS-CoV-2 by FDA under  an Emergency Use Authorization (EUA). This EUA will remain in effect (meaning this test can be used) for the duration of the COVID-19 declaration under Section 564(b)(1) of the Act, 21 U.S.C. section 360bbb-3(b)(1), unless the authorization is terminated or revoked.  Performed at [facility], [address], [place], [place] [address]   Urine culture     Status: Abnormal   Collection Time: 11/16/20  2:40 PM   Specimen: Urine, Clean Catch  Result Value Ref Range Status   Specimen Description   Final    URINE, CLEAN CATCH Performed at [facility], [address], [place], [place] [address]    Special Requests   Final    NONE Performed at [facility], [address], [place], [place] [address]    Culture >=100,000 COLONIES/mL ESCHERICHIA COLI (A)  Final   Report Status 11/19/2020 FINAL  Final   Organism ID, Bacteria ESCHERICHIA COLI (A)  Final      Susceptibility   Escherichia coli - MIC*    AMPICILLIN >=32 RESISTANT Resistant     CEFAZOLIN <=4 SENSITIVE Sensitive     CEFEPIME <=0.12 SENSITIVE Sensitive     CEFTRIAXONE <=0.25 SENSITIVE Sensitive     CIPROFLOXACIN >=4 RESISTANT Resistant     GENTAMICIN <=1 SENSITIVE Sensitive     IMIPENEM <=0.25 SENSITIVE Sensitive     NITROFURANTOIN <=16 SENSITIVE Sensitive     TRIMETH/SULFA >=320 RESISTANT Resistant     AMPICILLIN/SULBACTAM 16 INTERMEDIATE Intermediate     PIP/TAZO <=4 SENSITIVE Sensitive     * >=100,000 COLONIES/mL ESCHERICHIA COLI      RN Pressure Injury Documentation:     Estimated body mass index is 26.56 kg/m as calculated from the following:   Height as of this encounter: 5' (1.524 m).   Weight as of this encounter: 61.7 kg.  Malnutrition Type:   Malnutrition Characteristics:   Nutrition Interventions:    Radiology Studies: No results found.  Scheduled Meds:   stroke: mapping our early stages of recovery book   Does not  apply Once   aspirin EC   325 mg Oral Daily   cephALEXin  500 mg Oral Q12H   clopidogrel  75 mg Oral Daily   enoxaparin (LOVENOX) injection  30 mg Subcutaneous Q24H   insulin aspart  0-9 Units Subcutaneous TID WC   losartan  100 mg Oral Daily   melatonin  3 mg Oral QHS   metoprolol tartrate  25 mg Oral BID   sertraline  25 mg Oral Daily   traZODone  25 mg Oral QHS   Continuous Infusions:   LOS: 4 days   Merlene Laughtermair Latif Gladys Deckard, DO [facility] PAGER is on AMION  If 7PM-7AM, please contact night-coverage [email or web address]

## 2020-11-21 NOTE — Progress Notes (Signed)
Occupational Therapy Treatment Patient Details Name: Heather [name] MRN: [id number] DOB: [birth year] Today's Date: 11/21/2020    History of present illness Pt is a 80 y/o female who presents with AMS. Daughter found pt initially with pajama top buttoned around her leg, and later face down on the floor with clothing disheveled after an unwitnessed fall. 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 PMH significant for DM, CVA 2021, HTN.   OT comments  Pt progressing towards acute OT goals. Remains to require min A with functional transfers, min guard for mobilizing in the room. Cognitive deficits remain and impacting pt's safety with ADLs. Pt received in recliner, noted to have had episode of urinary incontinence. Pt seemed largely unaware/unconcerned. When mentioned to pt, pt indicated she was aware she was wet. Assisted pt in clean up and gown change. Full session details below. D/c plan remains appropriate.    Follow Up Recommendations  CIR    Equipment Recommendations  None recommended by OT    Recommendations for Other Services      Precautions / Restrictions Precautions Precautions: Fall Restrictions Weight Bearing Restrictions: No       Mobility Bed Mobility               General bed mobility comments: up in recliner    Transfers Overall transfer level: Needs assistance Equipment used: Rolling walker (2 wheeled) Transfers: Sit to/from Stand Sit to Stand: Min assist         General transfer comment: assist to fully powerup to standing. cues for technique and positioning of rw. Cued to have rw in front of her (not to the side) when attempting to stand    Balance Overall balance assessment: Needs assistance Sitting-balance support: Feet supported;No upper extremity supported Sitting balance-Leahy Scale: Fair      Standing balance support: Single extremity supported;Bilateral upper extremity supported;During functional activity Standing balance-Leahy Scale: Poor Standing balance comment: Requires at least 1 UE support for standing activity.                           ADL either performed or assessed with clinical judgement   ADL Overall ADL's : Needs assistance/impaired     Grooming: Min guard;Wash/dry hands Grooming Details (indicate cue type and reason): intermitent external support                 Toilet Transfer: Minimal assistance;Ambulation;Comfort height toilet;RW Statistician Details (indicate cue type and reason): up to min A to steady during transitional movements Toileting- Clothing Manipulation and Hygiene: Minimal assistance;Sit to/from stand;Min guard Toileting - Clothing Manipulation Details (indicate cue type and reason): setup, clothing manaagement, and light min A for balance while pt completed pericare in standing.     Functional mobility during ADLs: Min guard;Rolling walker;Cueing for safety General ADL Comments: Pt needed up to min A for functional transfers. Noted to lose balance on first attempt to stand from recliner during session. Assist to power up and steady. cues for technique and management of rw.     Vision   Vision Assessment?: Vision impaired- to be further tested in functional context Additional Comments: decreased attention to right   Perception     Praxis      Cognition Arousal/Alertness: Awake/alert Behavior During Therapy: WFL for tasks assessed/performed Overall Cognitive Status: Impaired/Different from baseline Area of Impairment:  Attention;Memory;Following commands;Safety/judgement;Awareness;Problem solving                   Current Attention Level: Sustained Memory: Decreased short-term memory Following Commands: Follows one step commands consistently;Follows one step commands with increased time Safety/Judgement:  Decreased awareness of safety;Decreased awareness of deficits Awareness: Emergent Problem Solving: Difficulty sequencing;Requires verbal cues;Slow processing General Comments: Pt received in recliner, noted to have had episode of urinary incontinence. Pt largely unaware/unconcerned. When mentioned to pt, pt indicated she was aware she was wet. Assisted pt in clean up and clothing chance. Some verbal and task perseverating noted this session. Decreased STM, confabulating at times. Tangental responses at times. Decreased insight into deficits.        Exercises     Shoulder Instructions       General Comments Pleasant demeanor. Seems a bit skeptical of hospital staff    Pertinent Vitals/ Pain       Pain Assessment: Faces Faces Pain Scale: Hurts a little bit Pain Location: head Pain Descriptors / Indicators: Aching Pain Intervention(s): Monitored during session  Home Living                                          Prior Functioning/Environment              Frequency  Min 2X/week        Progress Toward Goals  OT Goals(current goals can now be found in the care plan section)  Progress towards OT goals: Progressing toward goals  Acute Rehab OT Goals Patient Stated Goal: not stated this session OT Goal Formulation: With patient Time For Goal Achievement: 12/02/20 Potential to Achieve Goals: Good ADL Goals Pt Will Perform Grooming: with modified independence;standing Pt Will Perform Upper Body Dressing: with modified independence;standing Pt Will Perform Lower Body Dressing: with modified independence;sit to/from stand Pt Will Transfer to Toilet: with modified independence;ambulating;regular height toilet Pt Will Perform Toileting - Clothing Manipulation and hygiene: with modified independence;sit to/from stand Additional ADL Goal #1: Pt will perform 3 part path finding task with min verbal cues.  Plan Discharge plan remains appropriate     Co-evaluation                 AM-PAC OT "6 Clicks" Daily Activity     Outcome Measure   Help from another person eating meals?: A Little Help from another person taking care of personal grooming?: A Little Help from another person toileting, which includes using toliet, bedpan, or urinal?: A Little Help from another person bathing (including washing, rinsing, drying)?: A Little Help from another person to put on and taking off regular upper body clothing?: A Little Help from another person to put on and taking off regular lower body clothing?: A Little 6 Click Score: 18    End of Session Equipment Utilized During Treatment: Rolling walker  OT Visit Diagnosis: Unsteadiness on feet (R26.81);Muscle weakness (generalized) (M62.81);Other symptoms and signs involving cognitive function   Activity Tolerance Patient tolerated treatment well   Patient Left in chair;with call bell/phone within reach;with chair alarm set   Nurse Communication          Time: 1239-1310 OT Time Calculation (min): 31 min  Charges: OT General Charges $OT Visit: 1 Visit OT Treatments $Self Care/Home Management : 23-37 mins  Raynald Kemp, OT Acute Rehabilitation Services Pager: [phone] Office: [phone]    Pilar Grammes  11/21/2020, 1:41 PM

## 2020-11-21 NOTE — Progress Notes (Signed)
Inpatient Rehabilitation Admissions Coordinator   I contacted patient's daughter, Amy, by phone. We have arranged to meet with Duwayne Heck, Long term placement liaison and Amy, patient,and myself to discuss plans for rehab venue options at 230 today.  Ottie Glazier, RN, MSN Rehab Admissions Coordinator [phone] 11/21/2020 10:17 AM

## 2020-11-21 NOTE — Progress Notes (Signed)
BP (!) 197/69 (BP Location: Left Arm) Comment: RN Notified  Pulse 64   Temp 98.2 F (36.8 C) (Oral)   Resp 19   Ht 5' (1.524 m)   Wt 61.7 kg   SpO2 98%   BMI 26.56 kg/m   Sent a text to Dr. Marland Mcalpine with the following:  "Hey! Pt's BP is 197/69...her PRNs are for SBP>220....these were from her original order when they were allowing for permissive HTN...do you want those parameters changed? thx!"  Awaiting for return communication.

## 2020-11-22 LAB — CBC WITH DIFFERENTIAL/PLATELET
Abs Immature Granulocytes: 0.03 10*3/uL (ref 0.00–0.07)
Basophils Absolute: 0.1 10*3/uL (ref 0.0–0.1)
Basophils Relative: 1 %
Eosinophils Absolute: 0.6 10*3/uL — ABNORMAL HIGH (ref 0.0–0.5)
Eosinophils Relative: 8 %
HCT: 38.1 % (ref 36.0–46.0)
Hemoglobin: 12.6 g/dL (ref 12.0–15.0)
Immature Granulocytes: 0 %
Lymphocytes Relative: 24 %
Lymphs Abs: 1.7 10*3/uL (ref 0.7–4.0)
MCH: 31.2 pg (ref 26.0–34.0)
MCHC: 33.1 g/dL (ref 30.0–36.0)
MCV: 94.3 fL (ref 80.0–100.0)
Monocytes Absolute: 0.6 10*3/uL (ref 0.1–1.0)
Monocytes Relative: 9 %
Neutro Abs: 4.1 10*3/uL (ref 1.7–7.7)
Neutrophils Relative %: 58 %
Platelets: 211 10*3/uL (ref 150–400)
RBC: 4.04 MIL/uL (ref 3.87–5.11)
RDW: 13.2 % (ref 11.5–15.5)
WBC: 7.1 10*3/uL (ref 4.0–10.5)
nRBC: 0 % (ref 0.0–0.2)

## 2020-11-22 LAB — COMPREHENSIVE METABOLIC PANEL
ALT: 75 U/L — ABNORMAL HIGH (ref 0–44)
AST: 66 U/L — ABNORMAL HIGH (ref 15–41)
Albumin: 2.9 g/dL — ABNORMAL LOW (ref 3.5–5.0)
Alkaline Phosphatase: 202 U/L — ABNORMAL HIGH (ref 38–126)
Anion gap: 9 (ref 5–15)
BUN: 29 mg/dL — ABNORMAL HIGH (ref 8–23)
CO2: 23 mmol/L (ref 22–32)
Calcium: 9 mg/dL (ref 8.9–10.3)
Chloride: 105 mmol/L (ref 98–111)
Creatinine, Ser: 1.62 mg/dL — ABNORMAL HIGH (ref 0.44–1.00)
GFR, Estimated: 32 mL/min — ABNORMAL LOW (ref >60)
Glucose, Bld: 130 mg/dL — ABNORMAL HIGH (ref 70–99)
Potassium: 4.5 mmol/L (ref 3.5–5.1)
Sodium: 137 mmol/L (ref 135–145)
Total Bilirubin: 0.3 mg/dL (ref 0.3–1.2)
Total Protein: 5.4 g/dL — ABNORMAL LOW (ref 6.5–8.1)

## 2020-11-22 LAB — GLUCOSE, CAPILLARY
Glucose-Capillary: 127 mg/dL — ABNORMAL HIGH (ref 70–99)
Glucose-Capillary: 252 mg/dL — ABNORMAL HIGH (ref 70–99)
Glucose-Capillary: 145 mg/dL — ABNORMAL HIGH (ref 70–99)
Glucose-Capillary: 183 mg/dL — ABNORMAL HIGH (ref 70–99)
Glucose-Capillary: 133 mg/dL — ABNORMAL HIGH (ref 70–99)

## 2020-11-22 LAB — PHOSPHORUS: Phosphorus: 4.2 mg/dL (ref 2.5–4.6)

## 2020-11-22 LAB — MAGNESIUM: Magnesium: 2.1 mg/dL (ref 1.7–2.4)

## 2020-11-22 MED ORDER — HYDRALAZINE HCL 20 MG/ML IJ SOLN
10.0000 mg | Freq: Four times a day (QID) | INTRAMUSCULAR | Status: DC | PRN
  Filled 2020-11-22: qty 1

## 2020-11-22 NOTE — TOC Progression Note (Signed)
Transition of Care [facility]) - Progression Note    Patient Details  Name: Heather [name] MRN: [id number] Date of Birth: 1940-05-20  [facility] CM/SW Contact  Baldemar Lenis, [place] Phone Number: 11/22/2020, 4:00 PM  Clinical Narrative:   CSW spoke with patient's daughter, Amy, earlier today to provide bed offers. Amy requested time to speak with her sister to make a decision and would get back to CSW. CSW has not heard back. CSW contacted Amy again to check in and left a voicemail. Awaiting call back from daughter on SNF choice. CSW to follow.    Expected Discharge Plan: Skilled Nursing Facility Barriers to Discharge: Continued Medical Work up, English as a second language teacher  Expected Discharge Plan and Services Expected Discharge Plan: Skilled Nursing Facility     Post Acute Care Choice: Skilled Nursing Facility Living arrangements for the past 2 months: Single Family Home                                       Social Determinants of Health (SDOH) Interventions    Readmission Risk Interventions No flowsheet data found.

## 2020-11-22 NOTE — Progress Notes (Addendum)
PROGRESS NOTE    Heather [name]  ZOX:[id number] DOB: 07/19/1940 DOA: 11/16/2020 PCP: Caesar Bookman, NP   Brief Narrative:  The patient is a 80 year old Caucasian female with a past medical history significant for but not limited to essential hypertension, hyperlipidemia, history of ischemic CVA in 2021, diabetes mellitus type 2 as well as other comorbidities who presented to [facility] long ED on 11/16/2020 after a fall and speech and cognitive issues over the last few weeks.  Daughter was at bedside and contributed to the HPI and stated that her mother had not been acting right after being diagnosed with COVID-19 3 weeks prior.  Patient's daughter states that the patient stating that did not make sense and apparently had a short that was buttoned around her leg last night.  The patient's daughter found the patient in her closet on the floor with her short half-life and her pants down around her knees and unclear if his syncopal episode.  Daughter did not notice any weakness or facial droop but reported the patient did have some memory issues since her ischemic stroke yesterday.  Patient has reportedly increased urinary frequency and urgency but no dysuria.  Pulmonary febrile to the ED she had basic blood work done and urinalysis showed large leukocytes, negative nitrites, many bacteria and greater than 50 WBCs.  She is empirically started on antibiotics and head CT done showed no acute intracranial pathology but she underwent a MRI of the brain which showed acute infarct of the juxtacortical right frontal lobe with possible subacute infarcts as well.  Neurology was consulted and they recommended dual antiplatelet therapy.  TRH was consulted and admitted this patient for acute CVA and UTI.  Subsequently she improved from a neuro standpoint and was recommended for dual antiplatelet therapy for 3 months.  Patient still little confused however PT OT recommending CIR.  CIR working the patient up and is unclear of the  patient's disposition at this time but they have arranging a meeting with the patient's daughter long-term placement liaison to discuss placement options and now the plan is for the patient to go to SNF.   Assessment & Plan:   Principal Problem:   Acute CVA (cerebrovascular accident) [facility]) Active Problems:   Essential hypertension   Hyperlipidemia LDL goal <100   Type 2 diabetes mellitus without complication, without long-term current use of insulin (HCC)   Urinary tract infection   CKD (chronic kidney disease), stage III (HCC)  Acute metabolic encephalopathy, POA, improving and waxing and waning  -Family reports patient has been acting more confused over the last 3 weeks, after recent diagnosis of COVID-19 viral infection.   -Also reported patient with memory issues since previous ischemic CVA 2021.   -Patient was found down at home with reports of her shirt buttoned onto her leg.   -Ammonia level within normal limits, patient afebrile without leukocytosis.   -TSH 2.553, within normal limits.  -B12 slightly low at 391.   -Head imaging with acute and subacute infarcts as well as urinalysis suggestive of a urinary tract infection. -Continue treatment as below -Delirium Precautions -Supportive care; continues to be intermittently confused but easily reoriented   Acute ischemic CVA  -Patient was presenting to [facility] ED after being found down at home by family.  Per family report, patient has not been acting right with worsening confusion over the last 3 weeks; following recent diagnosis of COVID-19 viral infection.   -CT head without contrast with no acute intracranial findings but notable for  advanced small vessel white matter disease and encephalomalacia of the right caudate/corona radiata consistent with prior CVA.   -MR brain with acute infarct just a cortical right frontal lobe with subacute infarct left corpus callosum/occipital lobe.   -MRA head/neck with advanced intracranial  atherosclerosis with multifocal high-grade narrowing which is progressed from 2021 and chronically diminished flow left vertebral artery due to none resolved proximal stenosis.   -Hemoglobin A1c 6.6, LDL 167.   -TTE with LVEF 60-75% grade 1 diastolic dysfunction.   -EEG with diffuse mild encephalopathy nonspecific without seizure or epileptiform discharges. -Neurologywas following, appreciate assistance -Restarted metoprolol tartrate 25 mg p.o. twice daily and losartan 100 mg p.o. daily after allowing permissive hypertension for 48 hours -Per Neuro Recc's C/w DAPT w/ Plavix 75mg  daily and aspirin 325 mg daily x 3 Months days followed by aspirin alone -Currently Holding atorvastatin until LFTs normalize -PT/OT recommending CIR but now will go to SNF -Continue monitor on Telemetry    E. coli urinary tract infection -Patient reported increased urinary frequency/urgency without dysuria.   -Urinalysis with cloudy appearance and small hemoglobin on urine dipstick, large leukocytes, negative nitrites, many bacteria, greater than 50 RBCs per high-power field, 0-5 squamous epithelial cells, and greater than 50 WBCs -This could also be a contributing factor to her worsening confusion over the last 3 weeks.   -Urine culture with > 100,000 CFU of E Coli with resistance to TMP-SMX, ciprofloxacin, and ampicillin with intermediate sensitivities to ampicillin/sulbactam with sensitivities to the rest -Transitioned IV Ceftriaxone to Keflex based on culture susceptibilities to complete 5-day course; Day 4/5 today    Abnormal LFTs, improving  -AST went from 43 -> 104 -> 100 -> 75 -> 66 -ALT went from 36 -> 80 -> 96 -> 92 -> 75 -Unclear etiology, possible mild acute liver injury from unknown downtime at home.  Given need for increased statin dose for CVA, will trend LFTs. -T Bili was normal at 0.4 -If continues to worsen or not improve will obtain RUQ U/S and Acute Hepatitis Panel; but now improving   -Continue  to Monitor and Trend Hepatic Fxn Panel and repeat CMP in the AM  -Resume statin once LFTs are close to normal   Hypokalemia -Improved.  Patient's potassium is now 4.5 and magnesium is 2.1 -Continue to monitor and replete as necessary -Repeat CMP in a.m.  CKD Stage 3b -Stable. Patient's BUN/Cr is improved and went from 24/1.60 -> 25/1.59 -> 20/1.48 ->  27/1.66 -> 29/1.62 -Avoid nephrotoxic medications, contrast dyes, hypotension renally dose medications -Repeat CMP in a.m.   Essential Hypertension -Home regimen includes losartan 100 mg p.o. daily, metoprolol tartrate 25 mg p.o. twice daily. -Restarted home Metoprolol 25 mg p.o. twice daily and Losartan 100 mg p.o. daily after allowing permissive hypertension over the last previous 48 hours -Continue with hydralazine 25 mg p.o. q6h prn SBP >220 or DBP >110 -C/w Labetalol 10 mg q2h as needed for SBP >220 or DBP>110 -Continue to Monitor BP per Protocol -Last BP was 183/66   Hyperlipidemia -Lipid Panel done and showed a total cholesterol/HDL ratio 6.3, cholesterol level 227, HDL 36, LDL 167, triglycerides 120, VLDL 24 -Goal HDL less than 70 with acute CVA. -Continue Holding Atorvastatin until LFTs normalize   Type 2 Diabetes Mellitus -Hemoglobin A1c  was checked and 6.6 on 11/16/20, well controlled.  On metformin 500 mg p.o. daily at home. -Hold oral hypoglycemics while inpatient -C/w Sensitive Novolog SSI AC for coverage -CBG's ranging from 110-252 -CBG before every meal/at  bedtime   Depression and Anxiety -Continue Sertraline 25 mg p.o. daily  Insomnia -C/w Melatonin 3 mg po qHS and Trazodone 25 mg po qHS  GOC -DNR, poA  DVT prophylaxis: Enoxaparin 30 mg sq q24h Code Status: DO NOT RESUSCITATE Family Communication: Discussed with family present at bedside  Disposition Plan: \ SNF with eventual ALF placement.    Status is: Inpatient  Remains inpatient appropriate because:Unsafe d/c plan, IV treatments appropriate due to  intensity of illness or inability to take PO, and Inpatient level of care appropriate due to severity of illness  Dispo: The patient is from: Home              Anticipated d/c is to:  TBD              Patient currently is not medically stable to d/c.   Difficult to place patient No  Consultants:  Neurology  Procedures:  EEG ABNORMALITY - Intermittent slow, generalized   IMPRESSION: This study is suggestive of mild diffuse encephalopathy, nonspecific etiology. No seizures or epileptiform discharges were seen throughout the recording.    Antimicrobials:  Anti-infectives (From admission, onward)    Start     Dose/Rate Route Frequency Ordered Stop   11/19/20 1200  cephALEXin (KEFLEX) capsule 500 mg        500 mg Oral Every 12 hours 11/19/20 1052 11/21/20 2058   11/17/20 1600  cefTRIAXone (ROCEPHIN) 1 g in sodium chloride 0.9 % 100 mL IVPB  Status:  Discontinued        1 g 200 mL/hr over 30 Minutes Intravenous Every 24 hours 11/16/20 1934 11/19/20 1052   11/16/20 1545  cefTRIAXone (ROCEPHIN) 1 g in sodium chloride 0.9 % 100 mL IVPB        1 g 200 mL/hr over 30 Minutes Intravenous  Once 11/16/20 1543 11/16/20 1747        Subjective: Seen and examined at bedside and had no complaints.  Felt okay and had a bowel movement yesterday.  No nausea or vomiting.  Denies any other concerns or complaints at this time.  Objective: Vitals:   11/22/20 0420 11/22/20 0728 11/22/20 0918 11/22/20 1157  BP: (!) 191/71 (!) 219/69 (!) 174/68 (!) 183/66  Pulse: 67 (!) 55 64 (!) 52  Resp: Temp: 97.8 F (36.6 C) 98.3 F (36.8 C)  97.9 F (36.6 C)  TempSrc: Oral Oral  Oral  SpO2: 100% 99% 100% 98%  Weight:      Height:        Intake/Output Summary (Last 24 hours) at 11/22/2020 1436 Last data filed at 11/22/2020 0931 Gross per 24 hour  Intake --  Output 1700 ml  Net -1700 ml    Filed Weights   11/16/20 1208 11/17/20 1819  Weight: 61.7 kg 61.7 kg   Examination: Physical  Exam:  Constitutional: Overweight elderly Caucasian female in NAD and appears calm and comfortable Eyes: Lids and conjunctivae normal, sclerae anicteric  ENMT: External Ears, Nose appear normal. Grossly normal hearing.  Neck: Appears normal, supple, no cervical masses, normal ROM, no appreciable thyromegaly; no JVD Respiratory: Diminished to auscultation bilaterally, no wheezing, rales, rhonchi or crackles. Normal respiratory effort and patient is not tachypenic. No accessory muscle use. Unlabored breathing  Cardiovascular: RRR, no murmurs / rubs / gallops. S1 and S2 auscultated. Trace Extremity edema Abdomen: Soft, non-tender, Distended 2/2 body habitus. Bowel sounds positive.  GU: Deferred. Musculoskeletal: No clubbing / cyanosis of digits/nails. No joint deformity upper  and lower extremities.  Skin: No rashes, lesions, ulcers on a limited skin evaluation. No induration; Warm and dry.  Neurologic: CN 2-12 grossly intact with no focal deficits. Romberg sign and cerebellar reflexes not assessed.  Psychiatric: Normal judgment and insight. Alert and oriented x 2. Normal mood and appropriate affect.   Data Reviewed: I have personally reviewed following labs and imaging studies  CBC: Recent Labs  Lab 11/16/20 1349 11/20/20 0915 11/21/20 0919 11/22/20 0227  WBC 7.4 5.9 5.4 7.1  NEUTROABS 5.9 4.0 3.3 4.1  HGB 15.2* 13.6 13.3 12.6  HCT 45.9 41.3 40.4 38.1  MCV 93.7 95.4 94.4 94.3  PLT 244 236 226 211    Basic Metabolic Panel: Recent Labs  Lab 11/18/20 0402 11/19/20 0603 11/20/20 0320 11/21/20 0919 11/22/20 0227  NA 137 137 137 135 137  K 3.4* 3.8 4.4 4.1 4.5  CL 103 103 105 100 105  CO2 GLUCOSE 160* 139* 140* 234* 130*  BUN 24* 25* 20 27* 29*  CREATININE 1.60* 1.59* 1.48* 1.66* 1.62*  CALCIUM 9.0 8.9 8.9 9.0 9.0  MG 2.2 2.2 2.1 2.1 2.1  PHOS  --   --   --  3.5 4.2    GFR: Estimated Creatinine Clearance: 23.5 mL/min (A) (by C-G formula based on SCr of 1.62  mg/dL (H)). Liver Function Tests: Recent Labs  Lab 11/16/20 1349 11/18/20 0402 11/20/20 0320 11/21/20 0919 11/22/20 0227  AST 43* 104* 100* 75* 66*  ALT 36 80* 96* 92* 75*  ALKPHOS 158* 187* 223* 226* 202*  BILITOT 0.9 0.6 0.4 0.4 0.3  PROT 7.7 5.7* 5.2* 6.1* 5.4*  ALBUMIN 4.1 2.8* 2.8* 3.1* 2.9*    No results for input(s): LIPASE, AMYLASE in the last 168 hours. Recent Labs  Lab 11/17/20 0525  AMMONIA 10    Coagulation Profile: Recent Labs  Lab 11/16/20 1349  INR 0.9    Cardiac Enzymes: No results for input(s): CKTOTAL, CKMB, CKMBINDEX, TROPONINI in the last 168 hours. BNP (last 3 results) No results for input(s): PROBNP in the last 8760 hours. HbA1C: No results for input(s): HGBA1C in the last 72 hours. CBG: Recent Labs  Lab 11/21/20 1604 11/21/20 2124 11/22/20 0557 11/22/20 0901 11/22/20 1156  GLUCAP 110* 199* 127* 252* 145*    Lipid Profile: No results for input(s): CHOL, HDL, LDLCALC, TRIG, CHOLHDL, LDLDIRECT in the last 72 hours. Thyroid Function Tests: No results for input(s): TSH, T4TOTAL, FREET4, T3FREE, THYROIDAB in the last 72 hours. Anemia Panel: No results for input(s): VITAMINB12, FOLATE, FERRITIN, TIBC, IRON, RETICCTPCT in the last 72 hours. Sepsis Labs: No results for input(s): PROCALCITON, LATICACIDVEN in the last 168 hours.  Recent Results (from the past 240 hour(s))  Resp Panel by RT-PCR (Flu A&B, Covid) Nasopharyngeal Swab     Status: None   Collection Time: 11/16/20  1:54 PM   Specimen: Nasopharyngeal Swab; Nasopharyngeal(NP) swabs in vial transport medium  Result Value Ref Range Status   SARS Coronavirus 2 by RT PCR NEGATIVE NEGATIVE Final    Comment: (NOTE) SARS-CoV-2 target nucleic acids are NOT DETECTED.  The SARS-CoV-2 RNA is generally detectable in upper respiratory specimens during the acute phase of infection. The lowest concentration of SARS-CoV-2 viral copies this assay can detect is 138 copies/mL. A negative result  does not preclude SARS-Cov-2 infection and should not be used as the sole basis for treatment or other patient management decisions. A negative result may occur with  improper specimen collection/handling, submission of  specimen other than nasopharyngeal swab, presence of viral mutation(s) within the areas targeted by this assay, and inadequate number of viral copies(<138 copies/mL). A negative result must be combined with clinical observations, patient history, and epidemiological information. The expected result is Negative.  Fact Sheet for Patients:  BloggerCourse.comhttps://www.fda.gov/media/152166/download  Fact Sheet for Healthcare Providers:  SeriousBroker.ithttps://www.fda.gov/media/152162/download  This test is no t yet approved or cleared by the [place] FDA and  has been authorized for detection and/or diagnosis of SARS-CoV-2 by FDA under an Emergency Use Authorization (EUA). This EUA will remain  in effect (meaning this test can be used) for the duration of the COVID-19 declaration under Section 564(b)(1) of the Act, 21 U.S.C.section 360bbb-3(b)(1), unless the authorization is terminated  or revoked sooner.       Influenza A by PCR NEGATIVE NEGATIVE Final   Influenza B by PCR NEGATIVE NEGATIVE Final    Comment: (NOTE) The Xpert Xpress SARS-CoV-2/FLU/RSV plus assay is intended as an aid in the diagnosis of influenza from Nasopharyngeal swab specimens and should not be used as a sole basis for treatment. Nasal washings and aspirates are unacceptable for Xpert Xpress SARS-CoV-2/FLU/RSV testing.  Fact Sheet for Patients: BloggerCourse.comhttps://www.fda.gov/media/152166/download  Fact Sheet for Healthcare Providers: SeriousBroker.ithttps://www.fda.gov/media/152162/download  This test is not yet approved or cleared by the [place] FDA and has been authorized for detection and/or diagnosis of SARS-CoV-2 by FDA under an Emergency Use Authorization (EUA). This EUA will remain in effect (meaning this test can be used) for  the duration of the COVID-19 declaration under Section 564(b)(1) of the Act, 21 U.S.C. section 360bbb-3(b)(1), unless the authorization is terminated or revoked.  Performed at [facility], [address] [place], [place] [address]   Urine culture     Status: Abnormal   Collection Time: 11/16/20  2:40 PM   Specimen: Urine, Clean Catch  Result Value Ref Range Status   Specimen Description   Final    URINE, CLEAN CATCH Performed at [facility], [address] [place], [place] [address]    Special Requests   Final    NONE Performed at [facility], [address] [place], [place] [address]    Culture >=100,000 COLONIES/mL ESCHERICHIA COLI (A)  Final   Report Status 11/19/2020 FINAL  Final   Organism ID, Bacteria ESCHERICHIA COLI (A)  Final      Susceptibility   Escherichia coli - MIC*    AMPICILLIN >=32 RESISTANT Resistant     CEFAZOLIN <=4 SENSITIVE Sensitive     CEFEPIME <=0.12 SENSITIVE Sensitive     CEFTRIAXONE <=0.25 SENSITIVE Sensitive     CIPROFLOXACIN >=4 RESISTANT Resistant     GENTAMICIN <=1 SENSITIVE Sensitive     IMIPENEM <=0.25 SENSITIVE Sensitive     NITROFURANTOIN <=16 SENSITIVE Sensitive     TRIMETH/SULFA >=320 RESISTANT Resistant     AMPICILLIN/SULBACTAM 16 INTERMEDIATE Intermediate     PIP/TAZO <=4 SENSITIVE Sensitive     * >=100,000 COLONIES/mL ESCHERICHIA COLI   RN Pressure Injury Documentation:     Estimated body mass index is 26.56 kg/m as calculated from the following:   Height as of this encounter: 5' (1.524 m).   Weight as of this encounter: 61.7 kg.  Malnutrition Type:   Malnutrition Characteristics:   Nutrition Interventions:    Radiology Studies: No results found.  Scheduled Meds:   stroke: mapping our early stages of recovery book   Does not apply Once   aspirin EC  325 mg Oral Daily  clopidogrel  75 mg Oral Daily   enoxaparin (LOVENOX) injection  30 mg Subcutaneous Q24H    insulin aspart  0-9 Units Subcutaneous TID WC   losartan  100 mg Oral Daily   melatonin  3 mg Oral QHS   metoprolol tartrate  25 mg Oral BID   sertraline  25 mg Oral Daily   traZODone  25 mg Oral QHS   Continuous Infusions:   LOS: 5 days   Merlene Laughter, DO [facility] PAGER is on AMION  If 7PM-7AM, please contact night-coverage [email or web address]

## 2020-11-22 NOTE — TOC Initial Note (Signed)
Transition of Care [facility]) - Initial/Assessment Note    Patient Details  Name: Heather [name] MRN: [id number] Date of Birth: Apr 04, 1941  Transition of [facility]) CM/SW Contact:    Geralynn Ochs, LCSW Phone Number: 11/22/2020, 9:17 AM  Clinical Narrative:     CSW updated by rehab admissions that plan is for patient to admit to SNF while family works towards ALF placement. CSW met with patient and daughter Heather [name] at bedside to discuss SNF options. Heather [name] prefers [facility], as they live in [place], or a place in [place] as she works there. Otherwise, wants to be close to home. CSW faxed out referral and will follow up with patient and daughter on bed offers.              Expected Discharge Plan: Skilled Nursing Facility Barriers to Discharge: Continued Medical Work up, Ship broker   Patient Goals and CMS Choice Patient states their goals for this hospitalization and ongoing recovery are:: to get better CMS Medicare.gov Compare Post Acute Care list provided to:: Patient Choice offered to / list presented to : Patient, Adult Children  Expected Discharge Plan and Services Expected Discharge Plan: [place] Acute Care Choice: [place] Living arrangements for the past 2 months: Single Family Home                                      Prior Living Arrangements/Services Living arrangements for the past 2 months: Single Family Home Lives with:: Self Patient language and need for interpreter reviewed:: No Do you feel safe going back to the place where you live?: Yes      Need for Family Participation in Patient Care: Yes (Comment) Care giver support system in place?: No (comment)   Criminal Activity/Legal Involvement Pertinent to Current Situation/Hospitalization: No - Comment as needed  Activities of Daily Living Home Assistive Devices/Equipment: None ADL Screening (condition at time of admission) Patient's cognitive  ability adequate to safely complete daily activities?: No Is the patient deaf or have difficulty hearing?: No Does the patient have difficulty seeing, even when wearing glasses/contacts?: No Does the patient have difficulty concentrating, remembering, or making decisions?: Yes Patient able to express need for assistance with ADLs?: Yes Does the patient have difficulty dressing or bathing?: No Independently performs ADLs?: No Communication: Independent Dressing (OT): Needs assistance Is this a change from baseline?: Change from baseline, expected to last >3 days Grooming: Needs assistance Is this a change from baseline?: Change from baseline, expected to last >3 days Feeding: Needs assistance Is this a change from baseline?: Change from baseline, expected to last >3 days Bathing: Needs assistance Is this a change from baseline?: Change from baseline, expected to last >3 days Toileting: Needs assistance Is this a change from baseline?: Change from baseline, expected to last >3days In/Out Bed: Needs assistance Is this a change from baseline?: Change from baseline, expected to last >3 days Walks in Home: Needs assistance Is this a change from baseline?: Change from baseline, expected to last >3 days Does the patient have difficulty walking or climbing stairs?: Yes Weakness of Legs: Both Weakness of Arms/Hands: Both  Permission Sought/Granted Permission sought to share information with : Facility Sport and exercise psychologist, Family Supports Permission granted to share information with : Yes, Verbal Permission Granted  Share Information with NAME: Heather [name]  Permission granted to share info w AGENCY: SNF  Permission granted to share info w Relationship: Daughter     Emotional Assessment Appearance:: Appears stated age Attitude/Demeanor/Rapport: Engaged Affect (typically observed): Appropriate Orientation: : Oriented to Self, Oriented to Place, Oriented to  Time, Oriented to Situation Alcohol /  Substance Use: Not Applicable Psych Involvement: No (comment)  Admission diagnosis:  Acute cystitis with hematuria [N30.01] Fall, initial encounter [W19.XXXA] Acute CVA (cerebrovascular accident) ([place]) [I63.9] Altered mental status, unspecified altered mental status type [R41.82] Patient Active Problem List   Diagnosis Date Noted   Acute CVA (cerebrovascular accident) ([place]) 11/16/2020   Urinary tract infection 11/16/2020   CKD (chronic kidney disease), stage III ([place]) 11/16/2020   Essential hypertension 02/13/2020   Hyperlipidemia LDL goal <100 02/13/2020   Type 2 diabetes mellitus without complication, without long-term current use of insulin ([place]) 02/13/2020   History of stroke 2021   PCP:  Sandrea Hughs, NP Pharmacy:   [facility] [address]), [place] [address] [place] [address] Phone: [phone] Fax: [phone]     Social Determinants of Health (SDOH) Interventions    Readmission Risk Interventions No flowsheet data found.

## 2020-11-23 LAB — COMPREHENSIVE METABOLIC PANEL
ALT: 81 U/L — ABNORMAL HIGH (ref 0–44)
AST: 72 U/L — ABNORMAL HIGH (ref 15–41)
Albumin: 2.7 g/dL — ABNORMAL LOW (ref 3.5–5.0)
Alkaline Phosphatase: 198 U/L — ABNORMAL HIGH (ref 38–126)
Anion gap: 5 (ref 5–15)
BUN: 29 mg/dL — ABNORMAL HIGH (ref 8–23)
CO2: 26 mmol/L (ref 22–32)
Calcium: 9 mg/dL (ref 8.9–10.3)
Chloride: 108 mmol/L (ref 98–111)
Creatinine, Ser: 1.5 mg/dL — ABNORMAL HIGH (ref 0.44–1.00)
GFR, Estimated: 35 mL/min — ABNORMAL LOW (ref >60)
Glucose, Bld: 120 mg/dL — ABNORMAL HIGH (ref 70–99)
Potassium: 4.5 mmol/L (ref 3.5–5.1)
Sodium: 139 mmol/L (ref 135–145)
Total Bilirubin: 0.7 mg/dL (ref 0.3–1.2)
Total Protein: 5.4 g/dL — ABNORMAL LOW (ref 6.5–8.1)

## 2020-11-23 LAB — GLUCOSE, CAPILLARY
Glucose-Capillary: 121 mg/dL — ABNORMAL HIGH (ref 70–99)
Glucose-Capillary: 120 mg/dL — ABNORMAL HIGH (ref 70–99)
Glucose-Capillary: 230 mg/dL — ABNORMAL HIGH (ref 70–99)
Glucose-Capillary: 189 mg/dL — ABNORMAL HIGH (ref 70–99)
Glucose-Capillary: 236 mg/dL — ABNORMAL HIGH (ref 70–99)

## 2020-11-23 LAB — CBC WITH DIFFERENTIAL/PLATELET
Abs Immature Granulocytes: 0.04 10*3/uL (ref 0.00–0.07)
Basophils Absolute: 0.1 10*3/uL (ref 0.0–0.1)
Basophils Relative: 1 %
Eosinophils Absolute: 0.5 10*3/uL (ref 0.0–0.5)
Eosinophils Relative: 8 %
HCT: 37.2 % (ref 36.0–46.0)
Hemoglobin: 12.2 g/dL (ref 12.0–15.0)
Immature Granulocytes: 1 %
Lymphocytes Relative: 29 %
Lymphs Abs: 1.9 10*3/uL (ref 0.7–4.0)
MCH: 31.2 pg (ref 26.0–34.0)
MCHC: 32.8 g/dL (ref 30.0–36.0)
MCV: 95.1 fL (ref 80.0–100.0)
Monocytes Absolute: 0.6 10*3/uL (ref 0.1–1.0)
Monocytes Relative: 9 %
Neutro Abs: 3.3 10*3/uL (ref 1.7–7.7)
Neutrophils Relative %: 52 %
Platelets: 214 10*3/uL (ref 150–400)
RBC: 3.91 MIL/uL (ref 3.87–5.11)
RDW: 13.4 % (ref 11.5–15.5)
WBC: 6.3 10*3/uL (ref 4.0–10.5)
nRBC: 0 % (ref 0.0–0.2)

## 2020-11-23 LAB — MAGNESIUM: Magnesium: 2.3 mg/dL (ref 1.7–2.4)

## 2020-11-23 LAB — PHOSPHORUS: Phosphorus: 4.2 mg/dL (ref 2.5–4.6)

## 2020-11-23 MED ORDER — AMLODIPINE BESYLATE 10 MG PO TABS
10.0000 mg | ORAL_TABLET | Freq: Every day | ORAL | Status: DC
  Administered 2020-11-23 – 2020-11-26 (×4): 10 mg via ORAL
  Filled 2020-11-23 (×5): qty 1

## 2020-11-23 NOTE — Progress Notes (Addendum)
PROGRESS NOTE    Heather DodrillRuth E [name]  RUE:[id number] DOB: 1941/04/07 DOA: 11/16/2020 PCP: Caesar BookmanNgetich, Dinah C, NP   Brief Narrative:  The patient is a 80 year old Caucasian female with a past medical history significant for but not limited to essential hypertension, hyperlipidemia, history of ischemic CVA in 2021, diabetes mellitus type 2 as well as other comorbidities who presented to [facility] ED on 11/16/2020 after a fall and speech and cognitive issues over the last few weeks.  Daughter was at bedside and contributed to the HPI and stated that her mother had not been acting right after being diagnosed with COVID-19 3 weeks prior.  Patient's daughter states that the patient stating that did not make sense and apparently had a short that was buttoned around her leg last night.  The patient's daughter found the patient in her closet on the floor with her short half-life and her pants down around her knees and unclear if his syncopal episode.  Daughter did not notice any weakness or facial droop but reported the patient did have some memory issues since her ischemic stroke yesterday.  Patient has reportedly increased urinary frequency and urgency but no dysuria.  Pulmonary febrile to the ED she had basic blood work done and urinalysis showed large leukocytes, negative nitrites, many bacteria and greater than 50 WBCs.  She is empirically started on antibiotics and head CT done showed no acute intracranial pathology but she underwent a MRI of the brain which showed acute infarct of the juxtacortical right frontal lobe with possible subacute infarcts as well.  Neurology was consulted and they recommended dual antiplatelet therapy.  TRH was consulted and admitted this patient for acute CVA and UTI.  Subsequently she improved from a neuro standpoint and was recommended for dual antiplatelet therapy for 3 months.  Patient still little confused however PT OT recommending CIR.  CIR working the patient up and is unclear of the  patient's disposition at this time but they have arranging a meeting with the patient's daughter long-term placement liaison to discuss placement options and now the plan is for the patient to go to SNF and family deciding SNF choice.   Assessment & Plan:   Principal Problem:   Acute CVA (cerebrovascular accident) [facility](HCC) Active Problems:   Essential hypertension   Hyperlipidemia LDL goal <100   Type 2 diabetes mellitus without complication, without long-term current use of insulin (HCC)   Urinary tract infection   CKD (chronic kidney disease), stage III (HCC)  Acute metabolic encephalopathy, POA, improving and waxing and waning  -Family reports patient has been acting more confused over the last 3 weeks, after recent diagnosis of COVID-19 viral infection.   -Also reported patient with memory issues since previous ischemic CVA 2021.   -Patient was found down at home with reports of her shirt buttoned onto her leg.   -Ammonia level within normal limits, patient afebrile without leukocytosis.   -TSH 2.553, within normal limits.  -B12 slightly low at 391.   -Head imaging with acute and subacute infarcts as well as urinalysis suggestive of a urinary tract infection. -Continue treatment as below -Delirium Precautions -Supportive care; continues to be intermittently confused but easily reoriented   Acute ischemic CVA  -Patient was presenting to [facility] ED after being found down at home by family.  Per family report, patient has not been acting right with worsening confusion over the last 3 weeks; following recent diagnosis of COVID-19 viral infection.   -CT head without contrast with no acute  intracranial findings but notable for advanced small vessel white matter disease and encephalomalacia of the right caudate/corona radiata consistent with prior CVA.   -MR brain with acute infarct just a cortical right frontal lobe with subacute infarct left corpus callosum/occipital lobe.   -MRA head/neck with  advanced intracranial atherosclerosis with multifocal high-grade narrowing which is progressed from 2021 and chronically diminished flow left vertebral artery due to none resolved proximal stenosis.   -Hemoglobin A1c 6.6, LDL 167.   -TTE with LVEF 60-75% grade 1 diastolic dysfunction.   -EEG with diffuse mild encephalopathy nonspecific without seizure or epileptiform discharges. -Neurologywas following, appreciate assistance -Restarted metoprolol tartrate 25 mg p.o. twice daily and losartan 100 mg p.o. daily after allowing permissive hypertension for 48 hours -Per Neuro Recc's C/w DAPT w/ Plavix 75mg  daily and aspirin 325 mg daily x 3 Months days followed by aspirin alone -Currently Holding atorvastatin until LFTs normalize -PT/OT recommending CIR but now will go to SNF -Continue monitor on Telemetry    E. coli urinary tract infection -Patient reported increased urinary frequency/urgency without dysuria.   -Urinalysis with cloudy appearance and small hemoglobin on urine dipstick, large leukocytes, negative nitrites, many bacteria, greater than 50 RBCs per high-power field, 0-5 squamous epithelial cells, and greater than 50 WBCs -This could also be a contributing factor to her worsening confusion over the last 3 weeks.   -Urine culture with > 100,000 CFU of E Coli with resistance to TMP-SMX, ciprofloxacin, and ampicillin with intermediate sensitivities to ampicillin/sulbactam with sensitivities to the rest -Transitioned IV Ceftriaxone to Keflex based on culture susceptibilities to complete 5-day course; Day 4/5 today    Abnormal LFTs, improving  -AST went from 43 -> 104 -> 100 -> 75 -> 66 -> 72 -ALT went from 36 -> 80 -> 96 -> 92 -> 75 -> 81 -Unclear etiology, possible mild acute liver injury from unknown downtime at home.  Given need for increased statin dose for CVA, will trend LFTs. -T Bili was normal at 0.4 -If continues to worsen or not improve will obtain RUQ U/S and Acute Hepatitis  Panel; but now improving   -Continue to Monitor and Trend Hepatic Fxn Panel and repeat CMP in the AM  -Resume statin once LFTs are close to normal   Hypokalemia -Improved.  Patient's potassium is now 4.5 and magnesium is 2.3 -Continue to monitor and replete as necessary -Repeat CMP in a.m.  CKD Stage 3b -Stable. Patient's BUN/Cr is improved and went from 24/1.60 -> 25/1.59 -> 20/1.48 ->  27/1.66 -> 29/1.62 -> 29/1.50 -Avoid nephrotoxic medications, contrast dyes, hypotension renally dose medications -Repeat CMP in a.m.   Essential Hypertension -Home regimen includes losartan 100 mg p.o. daily, metoprolol tartrate 25 mg p.o. twice daily. -Restarted home Metoprolol 25 mg p.o. twice daily and Losartan 100 mg p.o. daily after allowing permissive hypertension over the last previous 48 hours -Will add Amlodipine 10 mg po Daily  -Stopped Hydralazine 25 mg p.o. q6h prn SBP >220 or DBP >110 and changed to IV Hydralazine 10 mg IV q6hprn SBP >180 or DBP >100 -C/w Labetalol 10 mg q2h as needed for SBP >220 or DBP>110 -Continue to Monitor BP per Protocol -Last BP was 1204/72 and improved to 175/86   Hyperlipidemia -Lipid Panel done and showed a total cholesterol/HDL ratio 6.3, cholesterol level 227, HDL 36, LDL 167, triglycerides 120, VLDL 24 -Goal HDL less than 70 with acute CVA. -Continue Holding Atorvastatin until LFTs normalize   Type 2 Diabetes Mellitus -Hemoglobin A1c  was  checked and 6.6 on 11/16/20, well controlled.  On metformin 500 mg p.o. daily at home. -Hold oral hypoglycemics while inpatient -C/w Sensitive Novolog SSI AC for coverage -CBG's ranging from 120-252 -CBG before every meal/at bedtime   Depression and Anxiety -Continue Sertraline 25 mg p.o. daily  Insomnia -C/w Melatonin 3 mg po qHS and Trazodone 25 mg po qHS  GOC -DNR, poA  DVT prophylaxis: Enoxaparin 30 mg sq q24h Code Status: DO NOT RESUSCITATE Family Communication: Discussed with daughter at bedside   Disposition Plan: SNF with eventual ALF placement.    Status is: Inpatient  Remains inpatient appropriate because:Unsafe d/c plan, IV treatments appropriate due to intensity of illness or inability to take PO, and Inpatient level of care appropriate due to severity of illness  Dispo: The patient is from: Home              Anticipated d/c is to:  TBD              Patient currently is not medically stable to d/c.   Difficult to place patient No  Consultants:  Neurology  Procedures:  EEG ABNORMALITY - Intermittent slow, generalized   IMPRESSION: This study is suggestive of mild diffuse encephalopathy, nonspecific etiology. No seizures or epileptiform discharges were seen throughout the recording.    Antimicrobials:  Anti-infectives (From admission, onward)    Start     Dose/Rate Route Frequency Ordered Stop   11/19/20 1200  cephALEXin (KEFLEX) capsule 500 mg        500 mg Oral Every 12 hours 11/19/20 1052 11/21/20 2058   11/17/20 1600  cefTRIAXone (ROCEPHIN) 1 g in sodium chloride 0.9 % 100 mL IVPB  Status:  Discontinued        1 g 200 mL/hr over 30 Minutes Intravenous Every 24 hours 11/16/20 1934 11/19/20 1052   11/16/20 1545  cefTRIAXone (ROCEPHIN) 1 g in sodium chloride 0.9 % 100 mL IVPB        1 g 200 mL/hr over 30 Minutes Intravenous  Once 11/16/20 1543 11/16/20 1747        Subjective: Seen and examined at bedside and no complaints today but had a headache yesterday.  Had a bowel movement yesterday.  No nausea or vomiting.  Feels well and awaiting for SNF placement.  No other concerns or plans at this time.  Objective: Vitals:   11/23/20 0424 11/23/20 0808 11/23/20 0825 11/23/20 1053  BP: (!) 172/79 (!) 204/72 (!) 175/86   Pulse: (!) 55 (!) 57  61  Resp: 18     Temp: 97.7 F (36.5 C) 97.8 F (36.6 C)    TempSrc: Oral Oral    SpO2: 99% 99%    Weight:      Height:        Intake/Output Summary (Last 24 hours) at 11/23/2020 1223 Last data filed at 11/23/2020  0900 Gross per 24 hour  Intake --  Output 1250 ml  Net -1250 ml    Filed Weights   11/16/20 1208 11/17/20 1819  Weight: 61.7 kg 61.7 kg   Examination: Physical Exam:  Constitutional: WN/WD overweight elderly Caucasian female currently no acute distress appears calm Eyes: Lids and conjunctivae normal, sclerae anicteric  ENMT: External Ears, Nose appear normal. Grossly normal hearing. Neck: Appears normal, supple, no cervical masses, normal ROM, no appreciable thyromegaly; no JVD Respiratory: Diminished to auscultation bilaterally, no wheezing, rales, rhonchi or crackles. Normal respiratory effort and patient is not tachypenic. No accessory muscle use. Unlabored breathing  Cardiovascular:  RRR, no murmurs / rubs / gallops. S1 and S2 auscultated. Trace edema  Abdomen: Soft, non-tender, Distended 2/2 to body habitus. Bowel sounds positive.  GU: Deferred. Musculoskeletal: No clubbing / cyanosis of digits/nails. No joint deformity upper and lower extremities.  Skin: No rashes, lesions, ulcers on a limited skin evaluation. No induration; Warm and dry.  Neurologic: CN 2-12 grossly intact with no focal deficits. Romberg sign and cerebellar reflexes not assessed.  Psychiatric: Normal judgment and insight. Alert and oriented x 3. Mildly agitated mood and appropriate affect.   Data Reviewed: I have personally reviewed following labs and imaging studies  CBC: Recent Labs  Lab 11/16/20 1349 11/20/20 0915 11/21/20 0919 11/22/20 0227 11/23/20 0359  WBC 7.4 5.9 5.4 7.1 6.3  NEUTROABS 5.9 4.0 3.3 4.1 3.3  HGB 15.2* 13.6 13.3 12.6 12.2  HCT 45.9 41.3 40.4 38.1 37.2  MCV 93.7 95.4 94.4 94.3 95.1  PLT 244 236 226 211 214    Basic Metabolic Panel: Recent Labs  Lab 11/19/20 0603 11/20/20 0320 11/21/20 0919 11/22/20 0227 11/23/20 0359  NA 137 137 135 137 139  K 3.8 4.4 4.1 4.5 4.5  CL 103 105 100 105 108  CO2 GLUCOSE 139* 140* 234* 130* 120*  BUN 25* 20 27* 29* 29*   CREATININE 1.59* 1.48* 1.66* 1.62* 1.50*  CALCIUM 8.9 8.9 9.0 9.0 9.0  MG 2.2 2.1 2.1 2.1 2.3  PHOS  --   --  3.5 4.2 4.2    GFR: Estimated Creatinine Clearance: 25.4 mL/min (A) (by C-G formula based on SCr of 1.5 mg/dL (H)). Liver Function Tests: Recent Labs  Lab 11/18/20 0402 11/20/20 0320 11/21/20 0919 11/22/20 0227 11/23/20 0359  AST 104* 100* 75* 66* 72*  ALT 80* 96* 92* 75* 81*  ALKPHOS 187* 223* 226* 202* 198*  BILITOT 0.6 0.4 0.4 0.3 0.7  PROT 5.7* 5.2* 6.1* 5.4* 5.4*  ALBUMIN 2.8* 2.8* 3.1* 2.9* 2.7*    No results for input(s): LIPASE, AMYLASE in the last 168 hours. Recent Labs  Lab 11/17/20 0525  AMMONIA 10    Coagulation Profile: Recent Labs  Lab 11/16/20 1349  INR 0.9    Cardiac Enzymes: No results for input(s): CKTOTAL, CKMB, CKMBINDEX, TROPONINI in the last 168 hours. BNP (last 3 results) No results for input(s): PROBNP in the last 8760 hours. HbA1C: No results for input(s): HGBA1C in the last 72 hours. CBG: Recent Labs  Lab 11/22/20 1156 11/22/20 1610 11/22/20 2105 11/23/20 0601 11/23/20 0808  GLUCAP 145* 183* 133* 121* 120*    Lipid Profile: No results for input(s): CHOL, HDL, LDLCALC, TRIG, CHOLHDL, LDLDIRECT in the last 72 hours. Thyroid Function Tests: No results for input(s): TSH, T4TOTAL, FREET4, T3FREE, THYROIDAB in the last 72 hours. Anemia Panel: No results for input(s): VITAMINB12, FOLATE, FERRITIN, TIBC, IRON, RETICCTPCT in the last 72 hours. Sepsis Labs: No results for input(s): PROCALCITON, LATICACIDVEN in the last 168 hours.  Recent Results (from the past 240 hour(s))  Resp Panel by RT-PCR (Flu A&B, Covid) Nasopharyngeal Swab     Status: None   Collection Time: 11/16/20  1:54 PM   Specimen: Nasopharyngeal Swab; Nasopharyngeal(NP) swabs in vial transport medium  Result Value Ref Range Status   SARS Coronavirus 2 by RT PCR NEGATIVE NEGATIVE Final    Comment: (NOTE) SARS-CoV-2 target nucleic acids are NOT  DETECTED.  The SARS-CoV-2 RNA is generally detectable in upper respiratory specimens during the acute phase of infection. The lowest concentration of  SARS-CoV-2 viral copies this assay can detect is 138 copies/mL. A negative result does not preclude SARS-Cov-2 infection and should not be used as the sole basis for treatment or other patient management decisions. A negative result may occur with  improper specimen collection/handling, submission of specimen other than nasopharyngeal swab, presence of viral mutation(s) within the areas targeted by this assay, and inadequate number of viral copies(<138 copies/mL). A negative result must be combined with clinical observations, patient history, and epidemiological information. The expected result is Negative.  Fact Sheet for Patients:  BloggerCourse.com  Fact Sheet for Healthcare Providers:  SeriousBroker.it  This test is no t yet approved or cleared by the [place] FDA and  has been authorized for detection and/or diagnosis of SARS-CoV-2 by FDA under an Emergency Use Authorization (EUA). This EUA will remain  in effect (meaning this test can be used) for the duration of the COVID-19 declaration under Section 564(b)(1) of the Act, 21 U.S.C.section 360bbb-3(b)(1), unless the authorization is terminated  or revoked sooner.       Influenza A by PCR NEGATIVE NEGATIVE Final   Influenza B by PCR NEGATIVE NEGATIVE Final    Comment: (NOTE) The Xpert Xpress SARS-CoV-2/FLU/RSV plus assay is intended as an aid in the diagnosis of influenza from Nasopharyngeal swab specimens and should not be used as a sole basis for treatment. Nasal washings and aspirates are unacceptable for Xpert Xpress SARS-CoV-2/FLU/RSV testing.  Fact Sheet for Patients: BloggerCourse.com  Fact Sheet for Healthcare Providers: SeriousBroker.it  This test is not yet  approved or cleared by the [place] FDA and has been authorized for detection and/or diagnosis of SARS-CoV-2 by FDA under an Emergency Use Authorization (EUA). This EUA will remain in effect (meaning this test can be used) for the duration of the COVID-19 declaration under Section 564(b)(1) of the Act, 21 U.S.C. section 360bbb-3(b)(1), unless the authorization is terminated or revoked.  Performed at [facility], [address], [place], [place] [address]   Urine culture     Status: Abnormal   Collection Time: 11/16/20  2:40 PM   Specimen: Urine, Clean Catch  Result Value Ref Range Status   Specimen Description   Final    URINE, CLEAN CATCH Performed at [facility], [address] [place], [place] [address]    Special Requests   Final    NONE Performed at [facility], [address] [place], [place] [address]    Culture >=100,000 COLONIES/mL ESCHERICHIA COLI (A)  Final   Report Status 11/19/2020 FINAL  Final   Organism ID, Bacteria ESCHERICHIA COLI (A)  Final      Susceptibility   Escherichia coli - MIC*    AMPICILLIN >=32 RESISTANT Resistant     CEFAZOLIN <=4 SENSITIVE Sensitive     CEFEPIME <=0.12 SENSITIVE Sensitive     CEFTRIAXONE <=0.25 SENSITIVE Sensitive     CIPROFLOXACIN >=4 RESISTANT Resistant     GENTAMICIN <=1 SENSITIVE Sensitive     IMIPENEM <=0.25 SENSITIVE Sensitive     NITROFURANTOIN <=16 SENSITIVE Sensitive     TRIMETH/SULFA >=320 RESISTANT Resistant     AMPICILLIN/SULBACTAM 16 INTERMEDIATE Intermediate     PIP/TAZO <=4 SENSITIVE Sensitive     * >=100,000 COLONIES/mL ESCHERICHIA COLI   RN Pressure Injury Documentation:     Estimated body mass index is 26.56 kg/m as calculated from the following:   Height as of this encounter: 5' (1.524 m).   Weight as of this encounter: 61.7 kg.  Malnutrition Type:   Malnutrition Characteristics:   Nutrition Interventions:    Radiology  Studies: No results found.  Scheduled Meds:   stroke: mapping our early stages of recovery book   Does not apply Once   amLODipine  10 mg Oral Daily   aspirin EC  325 mg Oral Daily   clopidogrel  75 mg Oral Daily   enoxaparin (LOVENOX) injection  30 mg Subcutaneous Q24H   insulin aspart  0-9 Units Subcutaneous TID WC   losartan  100 mg Oral Daily   melatonin  3 mg Oral QHS   metoprolol tartrate  25 mg Oral BID   sertraline  25 mg Oral Daily   traZODone  25 mg Oral QHS   Continuous Infusions:   LOS: 6 days   Merlene Laughter, DO [facility] PAGER is on AMION  If 7PM-7AM, please contact night-coverage [email or web address]

## 2020-11-23 NOTE — TOC Progression Note (Signed)
Transition of Care [facility]) - Progression Note    Patient Details  Name: Heather [name] MRN: [id number] Date of Birth: 05-03-1941  [facility]) CM/SW Contact  Carley Hammed, [place] Phone Number: 11/23/2020, 4:07 PM  Clinical Narrative:    CSW followed up with pt's sister to see if she had made a bed choice. She stated she did not get a list of bed offers. CSW provided bed offers again, she is reviewing with her sister and will contact CSW when they decide. CSW will follow up again tomorrow for choice.SW will continue to follow for DC needs.   Expected Discharge Plan: Skilled Nursing Facility Barriers to Discharge: Continued Medical Work up, English as a second language teacher  Expected Discharge Plan and Services Expected Discharge Plan: Skilled Nursing Facility     Post Acute Care Choice: Skilled Nursing Facility Living arrangements for the past 2 months: Single Family Home                                       Social Determinants of Health (SDOH) Interventions    Readmission Risk Interventions No flowsheet data found.

## 2020-11-23 NOTE — Progress Notes (Signed)
Occupational Therapy Treatment Patient Details Name: Heather [name] MRN: [id number] DOB: [birth year] Today's Date: 11/23/2020    History of present illness Pt is a 80 y/o female who presents with AMS. Daughter found pt initially with pajama top buttoned around her leg, and later face down on the floor with clothing disheveled after an unwitnessed fall. 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 PMH significant for DM, CVA 2021, HTN.   OT comments  Pt seen with dtr in room, requiring some encouragement for participation d/t difficulty with understanding benefit of participation. Tolerated clock draw activity complete- errors noted in number placement and spacing as well as hands with inability to recognize errors even with therapist guidance despite x2 efforts and examples. Pt tolerated functional transfers and self care mobility, sustained standing and ADL's at sink. Continues to present with decreased insight and safety leading to need for constant CGA-min A for safety. Ot will continue to follow acutely, with rec listed below. Dtr in agreement    Follow Up Recommendations  Supervision/Assistance - 24 hour;SNF    Equipment Recommendations  None recommended by OT    Recommendations for Other Services      Precautions / Restrictions Precautions Precautions: Fall Restrictions Weight Bearing Restrictions: No       Mobility Bed Mobility Overal bed mobility: Needs Assistance Bed Mobility: Sit to Supine     Supine to sit: Min guard     General bed mobility comments: cues for sequencing, use of bed rail    Transfers Overall transfer level: Needs assistance Equipment used: Rolling walker (2 wheeled) Transfers: Sit to/from [employer] Sit to Stand: Min guard Stand pivot transfers: Min guard       General transfer  comment: use of RW, cues for management and safety. A for steadying with dynamic standing tasks.    Balance                                           ADL either performed or assessed with clinical judgement   ADL       Grooming: Min guard;Wash/dry face;Wash/dry hands;Standing Grooming Details (indicate cue type and reason): susatined standing at sink requiring near constant redirection to task; easily distractable                     Toileting- Clothing Manipulation and Hygiene: Moderate assistance;Sit to/from stand Toileting - Clothing Manipulation Details (indicate cue type and reason): decreased attention to thoroughness, clothing management     Functional mobility during ADLs: Min guard;Rolling walker;Cueing for safety       Vision       Perception     Praxis      Cognition Arousal/Alertness: Awake/alert Behavior During Therapy: WFL for tasks assessed/performed Overall Cognitive Status: Impaired/Different from baseline Area of Impairment: Orientation;Attention;Memory;Following commands;Safety/judgement;Awareness;Problem solving                 Orientation Level: Disoriented to;Situation Current Attention Level: Sustained Memory: Decreased short-term memory Following Commands: Follows one step commands consistently;Follows one step commands with increased time Safety/Judgement: Decreased awareness of safety;Decreased awareness of deficits Awareness: Emergent Problem Solving: Slow processing;Difficulty sequencing;Requires verbal cues General Comments: impulsive tednency during session, poor problem solving. outcomes of clock draw assessment indicating poor awareness of  mistakes and processing of directions, and spatial deficits.        Exercises     Shoulder Instructions       General Comments      Pertinent Vitals/ Pain       Pain Assessment: No/denies pain  Home Living                                           Prior Functioning/Environment              Frequency  Min 2X/week        Progress Toward Goals  OT Goals(current goals can now be found in the care plan section)  Progress towards OT goals: Progressing toward goals  Acute Rehab OT Goals Patient Stated Goal: to walk more OT Goal Formulation: With patient Time For Goal Achievement: 12/02/20 Potential to Achieve Goals: Good  Plan Discharge plan remains appropriate    Co-evaluation                 AM-PAC OT "6 Clicks" Daily Activity     Outcome Measure   Help from another person eating meals?: A Little Help from another person taking care of personal grooming?: A Little Help from another person toileting, which includes using toliet, bedpan, or urinal?: A Lot Help from another person bathing (including washing, rinsing, drying)?: A Little Help from another person to put on and taking off regular upper body clothing?: A Little Help from another person to put on and taking off regular lower body clothing?: A Little 6 Click Score: 17    End of Session Equipment Utilized During Treatment: Gait belt;Rolling walker  OT Visit Diagnosis: Unsteadiness on feet (R26.81);Muscle weakness (generalized) (M62.81);Other symptoms and signs involving cognitive function   Activity Tolerance Patient tolerated treatment well   Patient Left in chair;with call bell/phone within reach;with chair alarm set   Nurse Communication Mobility status        Time: 0370-4888 OT Time Calculation (min): 35 min  Charges: OT General Charges $OT Visit: 1 Visit OT Treatments $Self Care/Home Management : 23-37 mins  Daniyal Tabor OTR/L acute rehab services Office: [phone]  11/23/2020, 1:28 PM

## 2020-11-24 LAB — COMPREHENSIVE METABOLIC PANEL
ALT: 76 U/L — ABNORMAL HIGH (ref 0–44)
AST: 63 U/L — ABNORMAL HIGH (ref 15–41)
Albumin: 2.7 g/dL — ABNORMAL LOW (ref 3.5–5.0)
Alkaline Phosphatase: 189 U/L — ABNORMAL HIGH (ref 38–126)
Anion gap: 4 — ABNORMAL LOW (ref 5–15)
BUN: 31 mg/dL — ABNORMAL HIGH (ref 8–23)
CO2: 27 mmol/L (ref 22–32)
Calcium: 8.6 mg/dL — ABNORMAL LOW (ref 8.9–10.3)
Chloride: 104 mmol/L (ref 98–111)
Creatinine, Ser: 1.74 mg/dL — ABNORMAL HIGH (ref 0.44–1.00)
GFR, Estimated: 30 mL/min — ABNORMAL LOW (ref >60)
Glucose, Bld: 127 mg/dL — ABNORMAL HIGH (ref 70–99)
Potassium: 4.5 mmol/L (ref 3.5–5.1)
Sodium: 135 mmol/L (ref 135–145)
Total Bilirubin: 0.8 mg/dL (ref 0.3–1.2)
Total Protein: 5.4 g/dL — ABNORMAL LOW (ref 6.5–8.1)

## 2020-11-24 LAB — GLUCOSE, CAPILLARY
Glucose-Capillary: 116 mg/dL — ABNORMAL HIGH (ref 70–99)
Glucose-Capillary: 165 mg/dL — ABNORMAL HIGH (ref 70–99)
Glucose-Capillary: 143 mg/dL — ABNORMAL HIGH (ref 70–99)
Glucose-Capillary: 207 mg/dL — ABNORMAL HIGH (ref 70–99)

## 2020-11-24 LAB — PHOSPHORUS: Phosphorus: 4.7 mg/dL — ABNORMAL HIGH (ref 2.5–4.6)

## 2020-11-24 LAB — MAGNESIUM: Magnesium: 2.3 mg/dL (ref 1.7–2.4)

## 2020-11-24 NOTE — TOC Progression Note (Signed)
Transition of Care [facility]) - Progression Note    Patient Details  Name: Heather [name] MRN: [id number] Date of Birth: 1940/11/13  [facility] CM/SW Contact  Carley Hammed, [place] Phone Number: 11/24/2020, 11:25 AM  Clinical Narrative:    CSW Followed up with pt's daughter Amy to discuss if they had chosen a bed. She asked if she could call back later, she was at her son's wedding. CSW advised she could call back this afternoon. SW will continue to follow for DC needs.   Expected Discharge Plan: Skilled Nursing Facility Barriers to Discharge: Continued Medical Work up, English as a second language teacher  Expected Discharge Plan and Services Expected Discharge Plan: Skilled Nursing Facility     Post Acute Care Choice: Skilled Nursing Facility Living arrangements for the past 2 months: Single Family Home                                       Social Determinants of Health (SDOH) Interventions    Readmission Risk Interventions No flowsheet data found.

## 2020-11-24 NOTE — Progress Notes (Addendum)
PROGRESS NOTE    Heather DodrillRuth E [name]  WUJ:[id number] DOB: [birth year] DOA: 11/16/2020 PCP: Caesar BookmanNgetich, Dinah C, NP   Brief Narrative:  The patient is a 80 year old Caucasian female with a past medical history significant for but not limited to essential hypertension, hyperlipidemia, history of ischemic CVA in 2021, diabetes mellitus type 2 as well as other comorbidities who presented to [facility] ED on 11/16/2020 after a fall and speech and cognitive issues over the last few weeks.  Daughter was at bedside and contributed to the HPI and stated that her mother had not been acting right after being diagnosed with COVID-19 3 weeks prior.  Patient's daughter states that the patient stating that did not make sense and apparently had a short that was buttoned around her leg last night.  The patient's daughter found the patient in her closet on the floor with her short half-life and her pants down around her knees and unclear if his syncopal episode.  Daughter did not notice any weakness or facial droop but reported the patient did have some memory issues since her ischemic stroke yesterday.  Patient has reportedly increased urinary frequency and urgency but no dysuria.  Pulmonary febrile to the ED she had basic blood work done and urinalysis showed large leukocytes, negative nitrites, many bacteria and greater than 50 WBCs.  She is empirically started on antibiotics and head CT done showed no acute intracranial pathology but she underwent a MRI of the brain which showed acute infarct of the juxtacortical right frontal lobe with possible subacute infarcts as well.  Neurology was consulted and they recommended dual antiplatelet therapy.  TRH was consulted and admitted this patient for acute CVA and UTI.  Subsequently she improved from a neuro standpoint and was recommended for dual antiplatelet therapy for 3 months.  Patient still little confused however PT OT recommending CIR.  CIR working the patient up and is unclear of the  patient's disposition at this time but they have arranging a meeting with the patient's daughter long-term placement liaison to discuss placement options and now the plan is for the patient to go to SNF and family deciding SNF choice.   Assessment & Plan:   Principal Problem:   Acute CVA (cerebrovascular accident) [facility](HCC) Active Problems:   Essential hypertension   Hyperlipidemia LDL goal <100   Type 2 diabetes mellitus without complication, without long-term current use of insulin (HCC)   Urinary tract infection   CKD (chronic kidney disease), stage III (HCC)  Acute metabolic encephalopathy, POA, improving and waxing and waning  -Family reports patient has been acting more confused over the last 3 weeks, after recent diagnosis of COVID-19 viral infection.   -Also reported patient with memory issues since previous ischemic CVA 2021.   -Patient was found down at home with reports of her shirt buttoned onto her leg.   -Ammonia level within normal limits, patient afebrile without leukocytosis.   -TSH 2.553, within normal limits.  -B12 slightly low at 391.   -Head imaging with acute and subacute infarcts as well as urinalysis suggestive of a urinary tract infection. -Continue treatment as below -Delirium Precautions -Supportive care; continues to be intermittently confused but easily reoriented   Acute ischemic CVA  -Patient was [facility] ED after being found down at home by family.  Per family report, patient has not been acting right with worsening confusion over the last 3 weeks; following recent diagnosis of COVID-19 viral infection.   -CT head without contrast with no acute  intracranial findings but notable for advanced small vessel white matter disease and encephalomalacia of the right caudate/corona radiata consistent with prior CVA.   -MR brain with acute infarct just a cortical right frontal lobe with subacute infarct left corpus callosum/occipital lobe.   -MRA head/neck with  advanced intracranial atherosclerosis with multifocal high-grade narrowing which is progressed from 2021 and chronically diminished flow left vertebral artery due to none resolved proximal stenosis.   -Hemoglobin A1c 6.6, LDL 167.   -TTE with LVEF 60-75% grade 1 diastolic dysfunction.   -EEG with diffuse mild encephalopathy nonspecific without seizure or epileptiform discharges. -Neurologywas following, appreciate assistance -Restarted metoprolol tartrate 25 mg p.o. twice daily and losartan 100 mg p.o. daily after allowing permissive hypertension for 48 hours -Per Neuro Recc's C/w DAPT w/ Plavix  daily and aspirin 325 mg daily x 3 Months days followed by aspirin alone -Currently Holding atorvastatin until LFTs normalize -PT/OT recommending CIR but now will go to SNF -Continue monitor on Telemetry    E. coli urinary tract infection -Patient reported increased urinary frequency/urgency without dysuria.   -Urinalysis with cloudy appearance and small hemoglobin on urine dipstick, large leukocytes, negative nitrites, many bacteria, greater than 50 RBCs per high-power field, 0-5 squamous epithelial cells, and greater than 50 WBCs -This could also be a contributing factor to her worsening confusion over the last 3 weeks.   -Urine culture with > 100,000 CFU of E Coli with resistance to TMP-SMX, ciprofloxacin, and ampicillin with intermediate sensitivities to ampicillin/sulbactam with sensitivities to the rest -Transitioned IV Ceftriaxone to Keflex based on culture susceptibilities to complete 5-day course and she is completed this now   Abnormal LFTs, improving  -AST went from 43 -> 104 -> 100 -> 75 -> 66 -> 72 -> 63 -ALT went from 36 -> 80 -> 96 -> 92 -> 75 -> 81 -> 76 -Unclear etiology, possible mild acute liver injury from unknown downtime at home.  Given need for increased statin dose for CVA, will trend LFTs. -T Bili was normal at 0.4 -If continues to worsen or not improve will obtain RUQ U/S  and Acute Hepatitis Panel; but now improving   -Continue to Monitor and Trend Hepatic Fxn Panel and repeat CMP in the AM  -Resume statin once LFTs are close to normal   Hypokalemia -Improved.  Patient's potassium is now 4.5 and magnesium is 2.3 again -Continue to monitor and replete as necessary -Repeat CMP in a.m.  CKD Stage 3b Hyperphosphatemia -Stable. Patient's BUN/Cr went from 24/1.60 -> 25/1.59 -> 20/1.48 ->  27/1.66 -> 29/1.62 -> 29/1.50 -> 31/1.74 -Patient's Phos Level is now 4.7 -Avoid nephrotoxic medications, contrast dyes, hypotension renally dose medications -Repeat CMP in a.m.   Essential Hypertension -Home regimen includes losartan 100 mg p.o. daily, metoprolol tartrate 25 mg p.o. twice daily. -Restarted home Metoprolol 25 mg p.o. twice daily and Losartan 100 mg p.o. daily after allowing permissive hypertension over the last previous 48 hours -Will add Amlodipine 10 mg po Daily  -Stopped Hydralazine 25 mg p.o. q6h prn SBP >220 or DBP >110 and changed to IV Hydralazine 10 mg IV q6hprn SBP >180 or DBP >100 -C/w Labetalol 10 mg q2h as needed for SBP >220 or DBP>110 -Continue to Monitor BP per Protocol -Last BP was 169/74   Hyperlipidemia -Lipid Panel done and showed a total cholesterol/HDL ratio 6.3, cholesterol level 227, HDL 36, LDL 167, triglycerides 120, VLDL 24 -Goal HDL less than 70 with acute CVA. -Continue Holding Atorvastatin until LFTs normalize  Type 2 Diabetes Mellitus -Hemoglobin A1c  was checked and 6.6 on 11/16/20, well controlled.  On metformin 500 mg p.o. daily at home. -Hold oral hypoglycemics while inpatient -C/w Sensitive Novolog SSI AC for coverage -CBG's ranging from 116-236 -CBG before every meal/at bedtime   Depression and Anxiety -Continue Sertraline 25 mg p.o. daily  Insomnia -C/w Melatonin 3 mg po qHS and Trazodone 25 mg po qHS  GOC -DNR, poA  DVT prophylaxis: Enoxaparin 30 mg sq q24h Code Status: DO NOT RESUSCITATE Family  Communication: No family present at bedside  Disposition Plan: SNF with eventual ALF placement.    Status is: Inpatient  Remains inpatient appropriate because:Unsafe d/c plan, IV treatments appropriate due to intensity of illness or inability to take PO, and Inpatient level of care appropriate due to severity of illness  Dispo: The patient is from: Home              Anticipated d/c is to:  TBD              Patient currently is not medically stable to d/c.   Difficult to place patient No  Consultants:  Neurology  Procedures:  EEG ABNORMALITY - Intermittent slow, generalized   IMPRESSION: This study is suggestive of mild diffuse encephalopathy, nonspecific etiology. No seizures or epileptiform discharges were seen throughout the recording.    Antimicrobials:  Anti-infectives (From admission, onward)    Start     Dose/Rate Route Frequency Ordered Stop   11/19/20 1200  cephALEXin (KEFLEX) capsule 500 mg        500 mg Oral Every 12 hours 11/19/20 1052 11/21/20 2058   11/17/20 1600  cefTRIAXone (ROCEPHIN) 1 g in sodium chloride 0.9 % 100 mL IVPB  Status:  Discontinued        1 g 200 mL/hr over 30 Minutes Intravenous Every 24 hours 11/16/20 1934 11/19/20 1052   11/16/20 1545  cefTRIAXone (ROCEPHIN) 1 g in sodium chloride 0.9 % 100 mL IVPB        1 g 200 mL/hr over 30 Minutes Intravenous  Once 11/16/20 1543 11/16/20 1747        Subjective: Seen and examined at bedside a and had no complaints watching television.  I asked her where her daughter was and the patient states that she was at her grandson's wedding.  Still awaiting SNF choice per the daughter.  No nausea or vomiting.  Denies any other concerns or complaints at this time.  Objective: Vitals:   11/23/20 2315 11/24/20 0341 11/24/20 0812 11/24/20 1017  BP: (!) 127/49 (!) 155/75 (!) 167/70 (!) 169/74  Pulse: 63 61 (!) 57 62  Resp: 19 18 18    Temp: 98.2 F (36.8 C) 97.8 F (36.6 C) 98 F (36.7 C)   TempSrc: Oral Oral  Oral   SpO2: 97% 98% 98%   Weight:      Height:        Intake/Output Summary (Last 24 hours) at 11/24/2020 1205 Last data filed at 11/24/2020 0600 Gross per 24 hour  Intake --  Output 700 ml  Net -700 ml    Filed Weights   11/16/20 1208 11/17/20 1819  Weight: 61.7 kg 61.7 kg   Examination: Physical Exam:  Constitutional: WN/WD overweight elderly Caucasian female currently in no acute distress  Eyes: Lids and conjunctivae normal, sclerae anicteric  ENMT: External Ears, Nose appear normal. Grossly normal hearing. .  Neck: Appears normal, supple, no cervical masses, normal ROM, no appreciable thyromegaly: No appreciable JVD Respiratory:  Diminished to auscultation bilaterally with coarse breath sounds, no wheezing, rales, rhonchi or crackles. Normal respiratory effort and patient is not tachypenic. No accessory muscle use.  Unlabored breathing Cardiovascular: RRR, no murmurs / rubs / gallops. S1 and S2 auscultated.  No appreciable extremity edema Abdomen: Soft, non-tender, slightly distended secondary to her body habitus. Bowel sounds positive.  GU: Deferred. Musculoskeletal: No clubbing / cyanosis of digits/nails. No joint deformity upper and lower extremities.  Skin: No rashes, lesions, ulcers on limited skin evaluation. No induration; Warm and dry.  Neurologic: CN 2-12 grossly intact with no focal deficits. Romberg sign and cerebellar reflexes not assessed.  Psychiatric: Normal judgment and insight. Alert and oriented x 3. Normal mood and appropriate affect.   Data Reviewed: I have personally reviewed following labs and imaging studies  CBC: Recent Labs  Lab 11/20/20 0915 11/21/20 0919 11/22/20 0227 11/23/20 0359  WBC 5.9 5.4 7.1 6.3  NEUTROABS 4.0 3.3 4.1 3.3  HGB 13.6 13.3 12.6 12.2  HCT 41.3 40.4 38.1 37.2  MCV 95.4 94.4 94.3 95.1  PLT 236 226 211 214    Basic Metabolic Panel: Recent Labs  Lab 11/20/20 0320 11/21/20 0919 11/22/20 0227 11/23/20 0359  11/24/20 0443  NA 137 135 137 139 135  K 4.4 4.1 4.5 4.5 4.5  CL 105 100 105 108 104  CO2 27 29 23 26 27   GLUCOSE 140* 234* 130* 120* 127*  BUN 20 27* 29* 29* 31*  CREATININE 1.48* 1.66* 1.62* 1.50* 1.74*  CALCIUM 8.9 9.0 9.0 9.0 8.6*  MG 2.1 2.1 2.1 2.3 2.3  PHOS  --  3.5 4.2 4.2 4.7*    GFR: Estimated Creatinine Clearance: 21.9 mL/min (A) (by C-G formula based on SCr of 1.74 mg/dL (H)). Liver Function Tests: Recent Labs  Lab 11/20/20 0320 11/21/20 0919 11/22/20 0227 11/23/20 0359 11/24/20 0443  AST 100* 75* 66* 72* 63*  ALT 96* 92* 75* 81* 76*  ALKPHOS 223* 226* 202* 198* 189*  BILITOT 0.4 0.4 0.3 0.7 0.8  PROT 5.2* 6.1* 5.4* 5.4* 5.4*  ALBUMIN 2.8* 3.1* 2.9* 2.7* 2.7*    No results for input(s): LIPASE, AMYLASE in the last 168 hours. No results for input(s): AMMONIA in the last 168 hours.  Coagulation Profile: No results for input(s): INR, PROTIME in the last 168 hours.  Cardiac Enzymes: No results for input(s): CKTOTAL, CKMB, CKMBINDEX, TROPONINI in the last 168 hours. BNP (last 3 results) No results for input(s): PROBNP in the last 8760 hours. HbA1C: No results for input(s): HGBA1C in the last 72 hours. CBG: Recent Labs  Lab 11/23/20 0808 11/23/20 1237 11/23/20 1605 11/23/20 2132 11/24/20 0551  GLUCAP 120* 230* 189* 236* 116*    Lipid Profile: No results for input(s): CHOL, HDL, LDLCALC, TRIG, CHOLHDL, LDLDIRECT in the last 72 hours. Thyroid Function Tests: No results for input(s): TSH, T4TOTAL, FREET4, T3FREE, THYROIDAB in the last 72 hours. Anemia Panel: No results for input(s): VITAMINB12, FOLATE, FERRITIN, TIBC, IRON, RETICCTPCT in the last 72 hours. Sepsis Labs: No results for input(s): PROCALCITON, LATICACIDVEN in the last 168 hours.  Recent Results (from the past 240 hour(s))  Resp Panel by RT-PCR (Flu A&B, Covid) Nasopharyngeal Swab     Status: None   Collection Time: 11/16/20  1:54 PM   Specimen: Nasopharyngeal Swab; Nasopharyngeal(NP)  swabs in vial transport medium  Result Value Ref Range Status   SARS Coronavirus 2 by RT PCR NEGATIVE NEGATIVE Final    Comment: (NOTE) SARS-CoV-2 target nucleic acids are NOT DETECTED.  The SARS-CoV-2 RNA is generally detectable in upper respiratory specimens during the acute phase of infection. The lowest concentration of SARS-CoV-2 viral copies this assay can detect is 138 copies/mL. A negative result does not preclude SARS-Cov-2 infection and should not be used as the sole basis for treatment or other patient management decisions. A negative result may occur with  improper specimen collection/handling, submission of specimen other than nasopharyngeal swab, presence of viral mutation(s) within the areas targeted by this assay, and inadequate number of viral copies(<138 copies/mL). A negative result must be combined with clinical observations, patient history, and epidemiological information. The expected result is Negative.  Fact Sheet for Patients:  BloggerCourse.com  Fact Sheet for Healthcare Providers:  SeriousBroker.it  This test is no t yet approved or cleared by the [place] FDA and  has been authorized for detection and/or diagnosis of SARS-CoV-2 by FDA under an Emergency Use Authorization (EUA). This EUA will remain  in effect (meaning this test can be used) for the duration of the COVID-19 declaration under Section 564(b)(1) of the Act, 21 U.S.C.section 360bbb-3(b)(1), unless the authorization is terminated  or revoked sooner.       Influenza A by PCR NEGATIVE NEGATIVE Final   Influenza B by PCR NEGATIVE NEGATIVE Final    Comment: (NOTE) The Xpert Xpress SARS-CoV-2/FLU/RSV plus assay is intended as an aid in the diagnosis of influenza from Nasopharyngeal swab specimens and should not be used as a sole basis for treatment. Nasal washings and aspirates are unacceptable for Xpert Xpress  SARS-CoV-2/FLU/RSV testing.  Fact Sheet for Patients: BloggerCourse.com  Fact Sheet for Healthcare Providers: SeriousBroker.it  This test is not yet approved or cleared by the [place] FDA and has been authorized for detection and/or diagnosis of SARS-CoV-2 by FDA under an Emergency Use Authorization (EUA). This EUA will remain in effect (meaning this test can be used) for the duration of the COVID-19 declaration under Section 564(b)(1) of the Act, 21 U.S.C. section 360bbb-3(b)(1), unless the authorization is terminated or revoked.  Performed at [facility], [address], [place], [place] [address]   Urine culture     Status: Abnormal   Collection Time: 11/16/20  2:40 PM   Specimen: Urine, Clean Catch  Result Value Ref Range Status   Specimen Description   Final    URINE, CLEAN CATCH Performed at [facility], [address], [place], [place] [address]    Special Requests   Final    NONE Performed at [facility], [address] [place], [place] [address]    Culture >=100,000 COLONIES/mL ESCHERICHIA COLI (A)  Final   Report Status 11/19/2020 FINAL  Final   Organism ID, Bacteria ESCHERICHIA COLI (A)  Final      Susceptibility   Escherichia coli - MIC*    AMPICILLIN >=32 RESISTANT Resistant     CEFAZOLIN <=4 SENSITIVE Sensitive     CEFEPIME <=0.12 SENSITIVE Sensitive     CEFTRIAXONE <=0.25 SENSITIVE Sensitive     CIPROFLOXACIN >=4 RESISTANT Resistant     GENTAMICIN <=1 SENSITIVE Sensitive     IMIPENEM <=0.25 SENSITIVE Sensitive     NITROFURANTOIN <=16 SENSITIVE Sensitive     TRIMETH/SULFA >=320 RESISTANT Resistant     AMPICILLIN/SULBACTAM 16 INTERMEDIATE Intermediate     PIP/TAZO <=4 SENSITIVE Sensitive     * >=100,000 COLONIES/mL ESCHERICHIA COLI   RN Pressure Injury Documentation:     Estimated body mass index is 26.56 kg/m as calculated from the  following:   Height as of this encounter: 5' (1.524 m).   Weight as of this encounter: 61.7 kg.  Malnutrition Type:   Malnutrition Characteristics:   Nutrition Interventions:    Radiology Studies: No results found.  Scheduled Meds:   stroke: mapping our early stages of recovery book   Does not apply Once   amLODipine  10 mg Oral Daily   aspirin EC  325 mg Oral Daily   clopidogrel  75 mg Oral Daily   enoxaparin (LOVENOX) injection  30 mg Subcutaneous Q24H   insulin aspart  0-9 Units Subcutaneous TID WC   losartan  100 mg Oral Daily   melatonin  3 mg Oral QHS   metoprolol tartrate  25 mg Oral BID   sertraline  25 mg Oral Daily   traZODone  25 mg Oral QHS   Continuous Infusions:   LOS: 7 days   Merlene Laughter, DO [facility] PAGER is on AMION  If 7PM-7AM, please contact night-coverage [email or web address]

## 2020-11-24 NOTE — Progress Notes (Addendum)
PROGRESS NOTE    Heather DodrillRuth E [name]  RUE:[id number] DOB: 1941/04/07 DOA: 11/16/2020 PCP: Heather [name]   Brief Narrative:  The patient is a 80 year old Caucasian female with a past medical history significant for but not limited to essential hypertension, hyperlipidemia, history of ischemic CVA in 2021, diabetes mellitus type 2 as well as other comorbidities who presented to [facility] ED on 11/16/2020 after a fall and speech and cognitive issues over the last few weeks.  Daughter was at bedside and contributed to the HPI and stated that her mother had not been acting right after being diagnosed with COVID-19 3 weeks prior.  Patient's daughter states that the patient stating that did not make sense and apparently had a short that was buttoned around her leg last night.  The patient's daughter found the patient in her closet on the floor with her short half-life and her pants down around her knees and unclear if his syncopal episode.  Daughter did not notice any weakness or facial droop but reported the patient did have some memory issues since her ischemic stroke yesterday.  Patient has reportedly increased urinary frequency and urgency but no dysuria.  Pulmonary febrile to the ED she had basic blood work done and urinalysis showed large leukocytes, negative nitrites, many bacteria and greater than 50 WBCs.  She is empirically started on antibiotics and head CT done showed no acute intracranial pathology but she underwent a MRI of the brain which showed acute infarct of the juxtacortical right frontal lobe with possible subacute infarcts as well.  Neurology was consulted and they recommended dual antiplatelet therapy.  TRH was consulted and admitted this patient for acute CVA and UTI.  Subsequently she improved from a neuro standpoint and was recommended for dual antiplatelet therapy for 3 months.  Patient still little confused however PT OT recommending CIR.  CIR working the patient up and is unclear of the  patient's disposition at this time but they have arranging a meeting with the patient's daughter long-term placement liaison to discuss placement options and now the plan is for the patient to go to SNF and family deciding SNF choice.   Assessment & Plan:   Principal Problem:   Acute CVA (cerebrovascular accident) [facility](HCC) Active Problems:   Essential hypertension   Hyperlipidemia LDL goal <100   Type 2 diabetes mellitus without complication, without long-term current use of insulin (HCC)   Urinary tract infection   CKD (chronic kidney disease), stage III (HCC)  Acute metabolic encephalopathy, POA, improving and waxing and waning  -Family reports patient has been acting more confused over the last 3 weeks, after recent diagnosis of COVID-19 viral infection.   -Also reported patient with memory issues since previous ischemic CVA 2021.   -Patient was found down at home with reports of her shirt buttoned onto her leg.   -Ammonia level within normal limits, patient afebrile without leukocytosis.   -TSH 2.553, within normal limits.  -B12 slightly low at 391.   -Head imaging with acute and subacute infarcts as well as urinalysis suggestive of a urinary tract infection. -Continue treatment as below -Delirium Precautions -Supportive care; continues to be intermittently confused but easily reoriented   Acute ischemic CVA  -Patient was presenting to [facility] ED after being found down at home by family.  Per family report, patient has not been acting right with worsening confusion over the last 3 weeks; following recent diagnosis of COVID-19 viral infection.   -CT head without contrast with no acute  intracranial findings but notable for advanced small vessel white matter disease and encephalomalacia of the right caudate/corona radiata consistent with prior CVA.   -MR brain with acute infarct just a cortical right frontal lobe with subacute infarct left corpus callosum/occipital lobe.   -MRA head/neck with  advanced intracranial atherosclerosis with multifocal high-grade narrowing which is progressed from 2021 and chronically diminished flow left vertebral artery due to none resolved proximal stenosis.   -Hemoglobin A1c 6.6, LDL 167.   -TTE with LVEF 60-75% grade 1 diastolic dysfunction.   -EEG with diffuse mild encephalopathy nonspecific without seizure or epileptiform discharges. -Neurologywas following, appreciate assistance -Restarted metoprolol tartrate 25 mg p.o. twice daily and losartan 100 mg p.o. daily after allowing permissive hypertension for 48 hours -Per Neuro Recc's C/w DAPT w/ Plavix 75mg  daily and aspirin 325 mg daily x 3 Months days followed by aspirin alone -Currently Holding atorvastatin until LFTs normalize -PT/OT recommending CIR but now will go to SNF -Continue monitor on Telemetry    E. coli urinary tract infection -Patient reported increased urinary frequency/urgency without dysuria.   -Urinalysis with cloudy appearance and small hemoglobin on urine dipstick, large leukocytes, negative nitrites, many bacteria, greater than 50 RBCs per high-power field, 0-5 squamous epithelial cells, and greater than 50 WBCs -This could also be a contributing factor to her worsening confusion over the last 3 weeks.   -Urine culture with > 100,000 CFU of E Coli with resistance to TMP-SMX, ciprofloxacin, and ampicillin with intermediate sensitivities to ampicillin/sulbactam with sensitivities to the rest -Transitioned IV Ceftriaxone to Keflex based on culture susceptibilities to complete 5-day course and she is completed this now   Abnormal LFTs, improving  -AST went from 43 -> 104 -> 100 -> 75 -> 66 -> 72 -> 63 -ALT went from 36 -> 80 -> 96 -> 92 -> 75 -> 81 -> 76 -Unclear etiology, possible mild acute liver injury from unknown downtime at home.  Given need for increased statin dose for CVA, will trend LFTs. -T Bili was normal at 0.8 today -If continues to worsen or not improve will obtain  RUQ U/S and Acute Hepatitis Panel; but now improving   -Continue to Monitor and Trend Hepatic Fxn Panel and repeat CMP in the AM  -Resume statin once LFTs are close to normal   Hypokalemia -Improved.  Patient's potassium is now 4.5 and magnesium is 2.3 again -Continue to monitor and replete as necessary -Repeat CMP in a.m.  CKD Stage 3b Hyperphosphatemia -Stable. Patient's BUN/Cr went from 24/1.60 -> 25/1.59 -> 20/1.48 ->  27/1.66 -> 29/1.62 -> 29/1.50 -> 31/1.74 -Patient's Phos Level is now 4.7 -Avoid nephrotoxic medications, contrast dyes, hypotension renally dose medications -Repeat CMP in a.m.   Essential Hypertension -Home regimen includes losartan 100 mg p.o. daily, metoprolol tartrate 25 mg p.o. twice daily. -Restarted home Metoprolol 25 mg p.o. twice daily and Losartan 100 mg p.o. daily after allowing permissive hypertension over the last previous 48 hours -Will add Amlodipine 10 mg po Daily  -Stopped Hydralazine 25 mg p.o. q6h prn SBP >220 or DBP >110 and changed to IV Hydralazine 10 mg IV q6hprn SBP >180 or DBP >100 -C/w Labetalol 10 mg q2h as needed for SBP >220 or DBP>110 -Continue to Monitor BP per Protocol -Last BP was 169/74   Hyperlipidemia -Lipid Panel done and showed a total cholesterol/HDL ratio 6.3, cholesterol level 227, HDL 36, LDL 167, triglycerides 120, VLDL 24 -Goal HDL less than 70 with acute CVA. -Continue Holding Atorvastatin until LFTs normalize  Type 2 Diabetes Mellitus -Hemoglobin A1c  was checked and 6.6 on 11/16/20, well controlled.  On metformin 500 mg p.o. daily at home. -Hold oral hypoglycemics while inpatient -C/w Sensitive Novolog SSI AC for coverage -CBG's ranging from 116-236 -CBG before every meal/at bedtime   Depression and Anxiety -Continue Sertraline 25 mg p.o. daily  Insomnia -C/w Melatonin 3 mg po qHS and Trazodone 25 mg po qHS  GOC -DNR, poA  DVT prophylaxis: Enoxaparin 30 mg sq q24h Code Status: DO NOT RESUSCITATE Family  Communication: No family present at bedside  Disposition Plan: SNF with eventual ALF placement.    Status is: Inpatient  Remains inpatient appropriate because:Unsafe d/c plan, IV treatments appropriate due to intensity of illness or inability to take PO, and Inpatient level of care appropriate due to severity of illness  Dispo: The patient is from: Home              Anticipated d/c is to:  TBD              Patient currently is not medically stable to d/c.   Difficult to place patient No  Consultants:  Neurology  Procedures:  EEG ABNORMALITY - Intermittent slow, generalized   IMPRESSION: This study is suggestive of mild diffuse encephalopathy, nonspecific etiology. No seizures or epileptiform discharges were seen throughout the recording.    Antimicrobials:  Anti-infectives (From admission, onward)    Start     Dose/Rate Route Frequency Ordered Stop   11/19/20 1200  cephALEXin (KEFLEX) capsule 500 mg        500 mg Oral Every 12 hours 11/19/20 1052 11/21/20 2058   11/17/20 1600  cefTRIAXone (ROCEPHIN) 1 g in sodium chloride 0.9 % 100 mL IVPB  Status:  Discontinued        1 g 200 mL/hr over 30 Minutes Intravenous Every 24 hours 11/16/20 1934 11/19/20 1052   11/16/20 1545  cefTRIAXone (ROCEPHIN) 1 g in sodium chloride 0.9 % 100 mL IVPB        1 g 200 mL/hr over 30 Minutes Intravenous  Once 11/16/20 1543 11/16/20 1747        Subjective: Seen and examined at bedside a and had no complaints watching television.  I asked her where her daughter was and the patient states that she was at her grandson's wedding.  Still awaiting SNF choice per the daughter.  No nausea or vomiting.  Denies any other concerns or complaints at this time.  Objective: Vitals:   11/23/20 2315 11/24/20 0341 11/24/20 0812 11/24/20 1017  BP: (!) 127/49 (!) 155/75 (!) 167/70 (!) 169/74  Pulse: 63 61 (!) 57 62  Resp: Temp: 98.2 F (36.8 C) 97.8 F (36.6 C) 98 F (36.7 C)   TempSrc: Oral Oral  Oral   SpO2: 97% 98% 98%   Weight:      Height:        Intake/Output Summary (Last 24 hours) at 11/24/2020 1213 Last data filed at 11/24/2020 0600 Gross per 24 hour  Intake --  Output 700 ml  Net -700 ml    Filed Weights   11/16/20 1208 11/17/20 1819  Weight: 61.7 kg 61.7 kg   Examination: Physical Exam:  Constitutional: WN/WD overweight elderly Caucasian female currently in no acute distress  Eyes: Lids and conjunctivae normal, sclerae anicteric  ENMT: External Ears, Nose appear normal. Grossly normal hearing. .  Neck: Appears normal, supple, no cervical masses, normal ROM, no appreciable thyromegaly: No appreciable JVD Respiratory:  Diminished to auscultation bilaterally with coarse breath sounds, no wheezing, rales, rhonchi or crackles. Normal respiratory effort and patient is not tachypenic. No accessory muscle use.  Unlabored breathing Cardiovascular: RRR, no murmurs / rubs / gallops. S1 and S2 auscultated.  No appreciable extremity edema Abdomen: Soft, non-tender, slightly distended secondary to her body habitus. Bowel sounds positive.  GU: Deferred. Musculoskeletal: No clubbing / cyanosis of digits/nails. No joint deformity upper and lower extremities.  Skin: No rashes, lesions, ulcers on limited skin evaluation. No induration; Warm and dry.  Neurologic: CN 2-12 grossly intact with no focal deficits. Romberg sign and cerebellar reflexes not assessed.  Psychiatric: Normal judgment and insight. Alert and oriented x 3. Normal mood and appropriate affect.   Data Reviewed: I have personally reviewed following labs and imaging studies  CBC: Recent Labs  Lab 11/20/20 0915 11/21/20 0919 11/22/20 0227 11/23/20 0359  WBC 5.9 5.4 7.1 6.3  NEUTROABS 4.0 3.3 4.1 3.3  HGB 13.6 13.3 12.6 12.2  HCT 41.3 40.4 38.1 37.2  MCV 95.4 94.4 94.3 95.1  PLT 236 226 211 214    Basic Metabolic Panel: Recent Labs  Lab 11/20/20 0320 11/21/20 0919 11/22/20 0227 11/23/20 0359  11/24/20 0443  NA 137 135 137 139 135  K 4.4 4.1 4.5 4.5 4.5  CL 105 100 105 108 104  CO2 27 29 23 26 27   GLUCOSE 140* 234* 130* 120* 127*  BUN 20 27* 29* 29* 31*  CREATININE 1.48* 1.66* 1.62* 1.50* 1.74*  CALCIUM 8.9 9.0 9.0 9.0 8.6*  MG 2.1 2.1 2.1 2.3 2.3  PHOS  --  3.5 4.2 4.2 4.7*    GFR: Estimated Creatinine Clearance: 21.9 mL/min (A) (by C-G formula based on SCr of 1.74 mg/dL (H)). Liver Function Tests: Recent Labs  Lab 11/20/20 0320 11/21/20 0919 11/22/20 0227 11/23/20 0359 11/24/20 0443  AST 100* 75* 66* 72* 63*  ALT 96* 92* 75* 81* 76*  ALKPHOS 223* 226* 202* 198* 189*  BILITOT 0.4 0.4 0.3 0.7 0.8  PROT 5.2* 6.1* 5.4* 5.4* 5.4*  ALBUMIN 2.8* 3.1* 2.9* 2.7* 2.7*    No results for input(s): LIPASE, AMYLASE in the last 168 hours. No results for input(s): AMMONIA in the last 168 hours.  Coagulation Profile: No results for input(s): INR, PROTIME in the last 168 hours.  Cardiac Enzymes: No results for input(s): CKTOTAL, CKMB, CKMBINDEX, TROPONINI in the last 168 hours. BNP (last 3 results) No results for input(s): PROBNP in the last 8760 hours. HbA1C: No results for input(s): HGBA1C in the last 72 hours. CBG: Recent Labs  Lab 11/23/20 0808 11/23/20 1237 11/23/20 1605 11/23/20 2132 11/24/20 0551  GLUCAP 120* 230* 189* 236* 116*    Lipid Profile: No results for input(s): CHOL, HDL, LDLCALC, TRIG, CHOLHDL, LDLDIRECT in the last 72 hours. Thyroid Function Tests: No results for input(s): TSH, T4TOTAL, FREET4, T3FREE, THYROIDAB in the last 72 hours. Anemia Panel: No results for input(s): VITAMINB12, FOLATE, FERRITIN, TIBC, IRON, RETICCTPCT in the last 72 hours. Sepsis Labs: No results for input(s): PROCALCITON, LATICACIDVEN in the last 168 hours.  Recent Results (from the past 240 hour(s))  Resp Panel by RT-PCR (Flu A&B, Covid) Nasopharyngeal Swab     Status: None   Collection Time: 11/16/20  1:54 PM   Specimen: Nasopharyngeal Swab; Nasopharyngeal([name])  swabs in vial transport medium  Result Value Ref Range Status   SARS Coronavirus 2 by RT PCR NEGATIVE NEGATIVE Final    Comment: (NOTE) SARS-CoV-2 target nucleic acids are NOT DETECTED.  The SARS-CoV-2 RNA is generally detectable in upper respiratory specimens during the acute phase of infection. The lowest concentration of SARS-CoV-2 viral copies this assay can detect is 138 copies/mL. A negative result does not preclude SARS-Cov-2 infection and should not be used as the sole basis for treatment or other patient management decisions. A negative result may occur with  improper specimen collection/handling, submission of specimen other than nasopharyngeal swab, presence of viral mutation(s) within the areas targeted by this assay, and inadequate number of viral copies(<138 copies/mL). A negative result must be combined with clinical observations, patient history, and epidemiological information. The expected result is Negative.  Fact Sheet for Patients:  BloggerCourse.com  Fact Sheet for Healthcare Providers:  SeriousBroker.it  This test is no t yet approved or cleared by the [place] FDA and  has been authorized for detection and/or diagnosis of SARS-CoV-2 by FDA under an Emergency Use Authorization (EUA). This EUA will remain  in effect (meaning this test can be used) for the duration of the COVID-19 declaration under Section 564(b)(1) of the Act, 21 U.S.C.section 360bbb-3(b)(1), unless the authorization is terminated  or revoked sooner.       Influenza A by PCR NEGATIVE NEGATIVE Final   Influenza B by PCR NEGATIVE NEGATIVE Final    Comment: (NOTE) The Xpert Xpress SARS-CoV-2/FLU/RSV plus assay is intended as an aid in the diagnosis of influenza from Nasopharyngeal swab specimens and should not be used as a sole basis for treatment. Nasal washings and aspirates are unacceptable for Xpert Xpress  SARS-CoV-2/FLU/RSV testing.  Fact Sheet for Patients: BloggerCourse.com  Fact Sheet for Healthcare Providers: SeriousBroker.it  This test is not yet approved or cleared by the [place] FDA and has been authorized for detection and/or diagnosis of SARS-CoV-2 by FDA under an Emergency Use Authorization (EUA). This EUA will remain in effect (meaning this test can be used) for the duration of the COVID-19 declaration under Section 564(b)(1) of the Act, 21 U.S.C. section 360bbb-3(b)(1), unless the authorization is terminated or revoked.  Performed at [facility], [address], [place], [place] [address]   Urine culture     Status: Abnormal   Collection Time: 11/16/20  2:40 PM   Specimen: Urine, Clean Catch  Result Value Ref Range Status   Specimen Description   Final    URINE, CLEAN CATCH Performed at [facility], [address], [place], [place] [address]    Special Requests   Final    NONE Performed at [facility], [address] [place], [place] [address]    Culture >=100,000 COLONIES/mL ESCHERICHIA COLI (A)  Final   Report Status 11/19/2020 FINAL  Final   Organism ID, Bacteria ESCHERICHIA COLI (A)  Final      Susceptibility   Escherichia coli - MIC*    AMPICILLIN >=32 RESISTANT Resistant     CEFAZOLIN <=4 SENSITIVE Sensitive     CEFEPIME <=0.12 SENSITIVE Sensitive     CEFTRIAXONE <=0.25 SENSITIVE Sensitive     CIPROFLOXACIN >=4 RESISTANT Resistant     GENTAMICIN <=1 SENSITIVE Sensitive     IMIPENEM <=0.25 SENSITIVE Sensitive     NITROFURANTOIN <=16 SENSITIVE Sensitive     TRIMETH/SULFA >=320 RESISTANT Resistant     AMPICILLIN/SULBACTAM 16 INTERMEDIATE Intermediate     PIP/TAZO <=4 SENSITIVE Sensitive     * >=100,000 COLONIES/mL ESCHERICHIA COLI   RN Pressure Injury Documentation:     Estimated body mass index is 26.56 kg/m as calculated from the  following:   Height as of this encounter: 5' (1.524 m).   Weight as of this encounter: 61.7 kg.  Malnutrition Type:   Malnutrition Characteristics:   Nutrition Interventions:    Radiology Studies: No results found.  Scheduled Meds:   stroke: mapping our early stages of recovery book   Does not apply Once   amLODipine  10 mg Oral Daily   aspirin EC  325 mg Oral Daily   clopidogrel  75 mg Oral Daily   enoxaparin (LOVENOX) injection  30 mg Subcutaneous Q24H   insulin aspart  0-9 Units Subcutaneous TID WC   losartan  100 mg Oral Daily   melatonin  3 mg Oral QHS   metoprolol tartrate  25 mg Oral BID   sertraline  25 mg Oral Daily   traZODone  25 mg Oral QHS   Continuous Infusions:   LOS: 7 days   Merlene Laughter, DO [facility] PAGER is on AMION  If 7PM-7AM, please contact night-coverage [email or web address]

## 2020-11-25 LAB — GLUCOSE, CAPILLARY
Glucose-Capillary: 201 mg/dL — ABNORMAL HIGH (ref 70–99)
Glucose-Capillary: 169 mg/dL — ABNORMAL HIGH (ref 70–99)
Glucose-Capillary: 208 mg/dL — ABNORMAL HIGH (ref 70–99)
Glucose-Capillary: 170 mg/dL — ABNORMAL HIGH (ref 70–99)

## 2020-11-25 LAB — COMPREHENSIVE METABOLIC PANEL
ALT: 68 U/L — ABNORMAL HIGH (ref 0–44)
AST: 52 U/L — ABNORMAL HIGH (ref 15–41)
Albumin: 2.9 g/dL — ABNORMAL LOW (ref 3.5–5.0)
Alkaline Phosphatase: 176 U/L — ABNORMAL HIGH (ref 38–126)
Anion gap: 6 (ref 5–15)
BUN: 30 mg/dL — ABNORMAL HIGH (ref 8–23)
CO2: 27 mmol/L (ref 22–32)
Calcium: 9 mg/dL (ref 8.9–10.3)
Chloride: 105 mmol/L (ref 98–111)
Creatinine, Ser: 1.43 mg/dL — ABNORMAL HIGH (ref 0.44–1.00)
GFR, Estimated: 38 mL/min — ABNORMAL LOW (ref >60)
Glucose, Bld: 166 mg/dL — ABNORMAL HIGH (ref 70–99)
Potassium: 4.5 mmol/L (ref 3.5–5.1)
Sodium: 138 mmol/L (ref 135–145)
Total Bilirubin: 0.6 mg/dL (ref 0.3–1.2)
Total Protein: 5.7 g/dL — ABNORMAL LOW (ref 6.5–8.1)

## 2020-11-25 LAB — MAGNESIUM: Magnesium: 2.3 mg/dL (ref 1.7–2.4)

## 2020-11-25 LAB — PHOSPHORUS: Phosphorus: 4 mg/dL (ref 2.5–4.6)

## 2020-11-25 NOTE — Progress Notes (Signed)
Physical Therapy Treatment Patient Details Name: Heather [name] MRN: [id number] DOB: [birth year] Today's Date: 11/25/2020    History of Present Illness Pt is a 80 y/o female who presents with AMS. Daughter found pt initially with pajama top buttoned around her leg, and later face down on the floor with clothing disheveled after an unwitnessed fall. 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 PMH significant for DM, CVA 2021, HTN.    PT Comments    Today's skilled session continued to focus on mobility progression with no issues noted or reported in session. The pt is making steady progress. Acute Pt to continue during pt's hospital stay.    Follow Up Recommendations  SNF;Supervision for mobility/OOB     Equipment Recommendations  None recommended by PT    Precautions / Restrictions Precautions Precautions: Fall Restrictions Weight Bearing Restrictions: No    Mobility  Bed Mobility Overal bed mobility: Needs Assistance Bed Mobility: Supine to Sit     Supine to sit: Min guard     General bed mobility comments: cues for sequencing, use of bed rail with increased time and effort needed    Transfers Overall transfer level: Needs assistance Equipment used: Rolling walker (2 wheeled) Transfers: Sit to/from Stand   Stand pivot transfers: Min guard       General transfer comment: cues for hand placement and weight shifting  Ambulation/Gait Ambulation/Gait assistance: Min guard;Min assist Gait Distance (Feet): 200 Feet Assistive device: Rolling walker (2 wheeled) Gait Pattern/deviations: Step-through pattern;Decreased stride length;Drifts right/left;Trunk flexed;Narrow base of support Gait velocity: decreased   General Gait Details: cues for walker negotiation to not run into objects on both sides of hallway with gait, walker  position with turning.       Modified Rankin (Stroke Patients Only) Modified Rankin (Stroke Patients Only) Pre-Morbid Rankin Score: Slight disability Modified Rankin: Moderately severe disability     Cognition Arousal/Alertness: Awake/alert Behavior During Therapy: WFL for tasks assessed/performed Overall Cognitive Status: Within Functional Limits for tasks assessed                       Memory: Decreased short-term memory Following Commands: Follows one step commands consistently;Follows multi-step commands inconsistently;Follows multi-step commands with increased time Safety/Judgement: Decreased awareness of safety;Decreased awareness of deficits     General Comments: cue for sequencing with task and to attend to surrounding's with gait on both sides as she would bump into objects       Pertinent Vitals/Pain Pain Assessment: No/denies pain Faces Pain Scale: No hurt     PT Goals (current goals can now be found in the care plan section) Acute Rehab PT Goals Patient Stated Goal: to walk more PT Goal Formulation: With patient Time For Goal Achievement: 12/02/20 Potential to Achieve Goals: Good Progress towards PT goals: Progressing toward goals    Frequency    Min 3X/week      PT Plan Current plan remains appropriate    AM-PAC PT "6 Clicks" Mobility   Outcome Measure  Help needed turning from your back to your side while in a flat bed without using bedrails?: A Little Help needed moving from lying on your back to sitting on the side of a flat bed without using bedrails?: A Little Help needed moving to and from a bed to a chair (including a wheelchair)?: A Little Help needed standing up from  a chair using your arms (e.g., wheelchair or bedside chair)?: A Little Help needed to walk in hospital room?: A Little Help needed climbing 3-5 steps with a railing? : A Little 6 Click Score: 18    End of Session Equipment Utilized During Treatment: Gait  belt Activity Tolerance: Patient tolerated treatment well Patient left: in chair;with call bell/phone within reach;with chair alarm set Nurse Communication: Mobility status PT Visit Diagnosis: Unsteadiness on feet (R26.81);Muscle weakness (generalized) (M62.81)     Time: 0272-5366 PT Time Calculation (min) (ACUTE ONLY): 24 min  Charges:  $Gait Training: 8-22 mins $Therapeutic Activity: 8-22 mins                     Sallyanne Kuster, PTA, [facility] Acute [employer] [phone] 11/25/20, 11:33 AM   Sallyanne Kuster 11/25/2020, 11:33 AM

## 2020-11-25 NOTE — Progress Notes (Addendum)
PROGRESS NOTE    Heather DodrillRuth E [name]  UJW:[id number] DOB: Sep 29, 1940 DOA: 11/16/2020 PCP: Heather BookmanNgetich, Heather C, NP   Brief Narrative:  The patient is a 80 year old Caucasian female with a past medical history significant for but not limited to essential hypertension, hyperlipidemia, history of ischemic CVA in 2021, diabetes mellitus type 2 as well as other comorbidities who presented to [facility] ED on 11/16/2020 after a fall and speech and cognitive issues over the last few weeks.  Daughter was at bedside and contributed to the HPI and stated that her mother had not been acting right after being diagnosed with COVID-19 3 weeks prior.  Patient's daughter states that the patient stating that did not make sense and apparently had a short that was buttoned around her leg last night.  The patient's daughter found the patient in her closet on the floor with her short half-life and her pants down around her knees and unclear if his syncopal episode.  Daughter did not notice any weakness or facial droop but reported the patient did have some memory issues since her ischemic stroke yesterday.  Patient has reportedly increased urinary frequency and urgency but no dysuria.  Pulmonary febrile to the ED she had basic blood work done and urinalysis showed large leukocytes, negative nitrites, many bacteria and greater than 50 WBCs.  She is empirically started on antibiotics and head CT done showed no acute intracranial pathology but she underwent a MRI of the brain which showed acute infarct of the juxtacortical right frontal lobe with possible subacute infarcts as well.  Neurology was consulted and they recommended dual antiplatelet therapy.  TRH was consulted and admitted this patient for acute CVA and UTI.  Subsequently she improved from a neuro standpoint and was recommended for dual antiplatelet therapy for 3 months.  Patient still little confused however PT OT recommending CIR.  CIR working the patient up and is unclear of the  patient's disposition at this time but they have arranging a meeting with the patient's daughter long-term placement liaison to discuss placement options and now the plan is for the patient to go to SNF and family deciding SNF choice.   Assessment & Plan:   Principal Problem:   Acute CVA (cerebrovascular accident) [facility](HCC) Active Problems:   Essential hypertension   Hyperlipidemia LDL goal <100   Type 2 diabetes mellitus without complication, without long-term current use of insulin (HCC)   Urinary tract infection   CKD (chronic kidney disease), stage III (HCC)  Acute metabolic encephalopathy, POA, improving and waxing and waning  -Family reports patient has been acting more confused over the last 3 weeks, after recent diagnosis of COVID-19 viral infection.   -Also reported patient with memory issues since previous ischemic CVA 2021.   -Patient was found down at home with reports of her shirt buttoned onto her leg.   -Ammonia level within normal limits, patient afebrile without leukocytosis.   -TSH 2.553, within normal limits.  -B12 slightly low at 391.   -Head imaging with acute and subacute infarcts as well as urinalysis suggestive of a urinary tract infection. -Continue treatment as below -Delirium Precautions -Supportive care; continues to be intermittently confused but easily reoriented   Acute ischemic CVA  -Patient was presenting [facility] ED after being found down at home by family.  Per family report, patient has not been acting right with worsening confusion over the last 3 weeks; following recent diagnosis of COVID-19 viral infection.   -CT head without contrast with no acute  intracranial findings but notable for advanced small vessel white matter disease and encephalomalacia of the right caudate/corona radiata consistent with prior CVA.   -MR brain with acute infarct just a cortical right frontal lobe with subacute infarct left corpus callosum/occipital lobe.   -MRA head/neck with  advanced intracranial atherosclerosis with multifocal high-grade narrowing which is progressed from 2021 and chronically diminished flow left vertebral artery due to none resolved proximal stenosis.   -Hemoglobin A1c 6.6, LDL 167.   -TTE with LVEF 60-75% grade 1 diastolic dysfunction.   -EEG with diffuse mild encephalopathy nonspecific without seizure or epileptiform discharges. -Neurologywas following, appreciate assistance -Restarted metoprolol tartrate 25 mg p.o. twice daily and losartan 100 mg p.o. daily after allowing permissive hypertension for 48 hours -Per Neuro Recc's C/w DAPT w/ Plavix 75mg  daily and aspirin 325 mg daily x 3 Months days followed by aspirin alone -Currently Holding atorvastatin until LFTs normalize -PT/OT recommending CIR but now will go to SNF -Continue monitor on Telemetry    E. coli urinary tract infection -Patient reported increased urinary frequency/urgency without dysuria.   -Urinalysis with cloudy appearance and small hemoglobin on urine dipstick, large leukocytes, negative nitrites, many bacteria, greater than 50 RBCs per high-power field, 0-5 squamous epithelial cells, and greater than 50 WBCs -This could also be a contributing factor to her worsening confusion over the last 3 weeks.   -Urine culture with > 100,000 CFU of E Coli with resistance to TMP-SMX, ciprofloxacin, and ampicillin with intermediate sensitivities to ampicillin/sulbactam with sensitivities to the rest -Transitioned IV Ceftriaxone to Keflex based on culture susceptibilities to complete 5-day course and she is completed this now   Abnormal LFTs, improving  -AST went from 43 -> 104 -> 100 -> 75 -> 66 -> 72 -> 63 -> 52 -ALT went from 36 -> 80 -> 96 -> 92 -> 75 -> 81 -> 76 -> 68 -Unclear etiology, possible mild acute liver injury from unknown downtime at home.  Given need for increased statin dose for CVA, will trend LFTs. -T Bili was normal at 0.8 today -If continues to worsen or not improve  will obtain RUQ U/S and Acute Hepatitis Panel; but now improving   -Continue to Monitor and Trend Hepatic Fxn Panel and repeat CMP in the AM  -Resume statin once LFTs are close to normal   Hypokalemia -Improved.  Patient's potassium is now 4.5 and magnesium is 2.3 again for the third day in a row -Continue to monitor and replete as necessary -Repeat CMP in a.m.  CKD Stage 3b Hyperphosphatemia -Stable. Patient's BUN/Cr went from 24/1.60 -> 25/1.59 -> 20/1.48 ->  27/1.66 -> 29/1.62 -> 29/1.50 -> 31/1.74 -> 30/1.43 -Patient's Phos Level is now gone from 4.7 -> 4.0 -Avoid nephrotoxic medications, contrast dyes, hypotension renally dose medications -Repeat CMP in a.m.   Essential Hypertension -Home regimen includes losartan 100 mg p.o. daily, metoprolol tartrate 25 mg p.o. twice daily. -Restarted home Metoprolol 25 mg p.o. twice daily and Losartan 100 mg p.o. daily after allowing permissive hypertension over the last previous 48 hours -Will add Amlodipine 10 mg po Daily  -Stopped Hydralazine 25 mg p.o. q6h prn SBP >220 or DBP >110 and changed to IV Hydralazine 10 mg IV q6hprn SBP >180 or DBP >100 -C/w Labetalol 10 mg q2h as needed for SBP >220 or DBP>110 -Continue to Monitor BP per Protocol -Last BP was 154/55   Hyperlipidemia -Lipid Panel done and showed a total cholesterol/HDL ratio 6.3, cholesterol level 227, HDL 36, LDL 167, triglycerides  120, VLDL 24 -Goal HDL less than 70 with acute CVA. -Continue Holding Atorvastatin until LFTs normalize   Type 2 Diabetes Mellitus -Hemoglobin A1c  was checked and 6.6 on 11/16/20, well controlled.  On metformin 500 mg p.o. daily at home. -Hold oral hypoglycemics while inpatient -C/w Sensitive Novolog SSI AC for coverage -CBG's ranging from 116-207 -CBG before every meal/at bedtime   Depression and Anxiety -Continue Sertraline 25 mg p.o. daily  Insomnia -C/w Melatonin 3 mg po qHS and Trazodone 25 mg po qHS  GOC -DNR, poA  DVT prophylaxis:  Enoxaparin 30 mg sq q24h Code Status: DO NOT RESUSCITATE Family Communication: No family present at bedside  Disposition Plan: SNF with eventual ALF placement afterwards.    Status is: Inpatient  Remains inpatient appropriate because:Unsafe d/c plan, IV treatments appropriate due to intensity of illness or inability to take PO, and Inpatient level of care appropriate due to severity of illness  Dispo: The patient is from: Home              Anticipated d/c is to:  TBD              Patient currently is not medically stable to d/c.   Difficult to place patient No  Consultants:  Neurology  Procedures:  EEG ABNORMALITY - Intermittent slow, generalized   IMPRESSION: This study is suggestive of mild diffuse encephalopathy, nonspecific etiology. No seizures or epileptiform discharges were seen throughout the recording.    Antimicrobials:  Anti-infectives (From admission, onward)    Start     Dose/Rate Route Frequency Ordered Stop   11/19/20 1200  cephALEXin (KEFLEX) capsule 500 mg        500 mg Oral Every 12 hours 11/19/20 1052 11/21/20 2058   11/17/20 1600  cefTRIAXone (ROCEPHIN) 1 g in sodium chloride 0.9 % 100 mL IVPB  Status:  Discontinued        1 g 200 mL/hr over 30 Minutes Intravenous Every 24 hours 11/16/20 1934 11/19/20 1052   11/16/20 1545  cefTRIAXone (ROCEPHIN) 1 g in sodium chloride 0.9 % 100 mL IVPB        1 g 200 mL/hr over 30 Minutes Intravenous  Once 11/16/20 1543 11/16/20 1747        Subjective: Seen and examined at bedside and she had no complaints.  She is seen in the bed without issues.  Awaiting SNF placement.  No nausea or vomiting.  Felt okay.  No other concerns or complaints at this time.  Objective: Vitals:   11/24/20 2359 11/25/20 0348 11/25/20 0722 11/25/20 1121  BP: (!) 146/50 (!) 164/51 (!) 173/65 (!) 154/55  Pulse: (!) 57 (!) 50 60 (!) 55  Resp: Temp: 97.9 F (36.6 C)  97.7 F (36.5 C) 98.1 F (36.7 C)  TempSrc:   Oral Oral   SpO2: 96% 96% 99% 98%  Weight:      Height:        Intake/Output Summary (Last 24 hours) at 11/25/2020 1247 Last data filed at 11/25/2020 0600 Gross per 24 hour  Intake 420 ml  Output 800 ml  Net -380 ml    Filed Weights   11/16/20 1208 11/17/20 1819  Weight: 61.7 kg 61.7 kg   Examination: Physical Exam:  Constitutional: WN/WD overweight elderly Caucasian female in NAD and appears calm  Eyes: Lids and conjunctivae normal, sclerae anicteric  ENMT: External Ears, Nose appear normal. Grossly normal hearing. Neck: Appears normal, supple, no cervical masses, normal ROM,  no appreciable thyromegaly; no JVD Respiratory: Diminished to auscultation bilaterally with coarse breath sounds, no wheezing, rales, rhonchi or crackles. Normal respiratory effort and patient is not tachypenic. No accessory muscle use. Unlabored breathing  Cardiovascular: RRR, no murmurs / rubs / gallops. S1 and S2 auscultated. No extremity edema.  Abdomen: Soft, non-tender, non-distended. Bowel sounds positive.  GU: Deferred. Musculoskeletal: No clubbing / cyanosis of digits/nails. No joint deformity upper and lower extremities.  Skin: No rashes, lesions, ulcers on a limited skin evaluation. No induration; Warm and dry.  Neurologic: CN 2-12 grossly intact with no focal deficits. Romberg sign and cerebellar reflexes not assessed.  Psychiatric: Normal judgment and insight. Alert and oriented x 3. Normal mood and appropriate affect.   Data Reviewed: I have personally reviewed following labs and imaging studies  CBC: Recent Labs  Lab 11/20/20 0915 11/21/20 0919 11/22/20 0227 11/23/20 0359  WBC 5.9 5.4 7.1 6.3  NEUTROABS 4.0 3.3 4.1 3.3  HGB 13.6 13.3 12.6 12.2  HCT 41.3 40.4 38.1 37.2  MCV 95.4 94.4 94.3 95.1  PLT 236 226 211 214    Basic Metabolic Panel: Recent Labs  Lab 11/21/20 0919 11/22/20 0227 11/23/20 0359 11/24/20 0443 11/25/20 0125  NA 135 137 139 135 138  K 4.1 4.5 4.5 4.5 4.5  CL 100 105  108 104 105  CO2 29 23 26 27 27   GLUCOSE 234* 130* 120* 127* 166*  BUN 27* 29* 29* 31* 30*  CREATININE 1.66* 1.62* 1.50* 1.74* 1.43*  CALCIUM 9.0 9.0 9.0 8.6* 9.0  MG 2.1 2.1 2.3 2.3 2.3  PHOS 3.5 4.2 4.2 4.7* 4.0   GFR: Estimated Creatinine Clearance: 26.6 mL/min (A) (by C-G formula based on SCr of 1.43 mg/dL (H)). Liver Function Tests: Recent Labs  Lab 11/21/20 0919 11/22/20 0227 11/23/20 0359 11/24/20 0443 11/25/20 0125  AST 75* 66* 72* 63* 52*  ALT 92* 75* 81* 76* 68*  ALKPHOS 226* 202* 198* 189* 176*  BILITOT 0.4 0.3 0.7 0.8 0.6  PROT 6.1* 5.4* 5.4* 5.4* 5.7*  ALBUMIN 3.1* 2.9* 2.7* 2.7* 2.9*    No results for input(s): LIPASE, AMYLASE in the last 168 hours. No results for input(s): AMMONIA in the last 168 hours.  Coagulation Profile: No results for input(s): INR, PROTIME in the last 168 hours.  Cardiac Enzymes: No results for input(s): CKTOTAL, CKMB, CKMBINDEX, TROPONINI in the last 168 hours. BNP (last 3 results) No results for input(s): PROBNP in the last 8760 hours. HbA1C: No results for input(s): HGBA1C in the last 72 hours. CBG: Recent Labs  Lab 11/24/20 1220 11/24/20 1548 11/24/20 2209 11/25/20 0924 11/25/20 1122  GLUCAP 165* 143* 207* 201* 169*    Lipid Profile: No results for input(s): CHOL, HDL, LDLCALC, TRIG, CHOLHDL, LDLDIRECT in the last 72 hours. Thyroid Function Tests: No results for input(s): TSH, T4TOTAL, FREET4, T3FREE, THYROIDAB in the last 72 hours. Anemia Panel: No results for input(s): VITAMINB12, FOLATE, FERRITIN, TIBC, IRON, RETICCTPCT in the last 72 hours. Sepsis Labs: No results for input(s): PROCALCITON, LATICACIDVEN in the last 168 hours.  Recent Results (from the past 240 hour(s))  Resp Panel by RT-PCR (Flu A&B, Covid) Nasopharyngeal Swab     Status: None   Collection Time: 11/16/20  1:54 PM   Specimen: Nasopharyngeal Swab; Nasopharyngeal(NP) swabs in vial transport medium  Result Value Ref Range Status   SARS  Coronavirus 2 by RT PCR NEGATIVE NEGATIVE Final    Comment: (NOTE) SARS-CoV-2 target nucleic acids are NOT DETECTED.  The SARS-CoV-2  RNA is generally detectable in upper respiratory specimens during the acute phase of infection. The lowest concentration of SARS-CoV-2 viral copies this assay can detect is 138 copies/mL. A negative result does not preclude SARS-Cov-2 infection and should not be used as the sole basis for treatment or other patient management decisions. A negative result may occur with  improper specimen collection/handling, submission of specimen other than nasopharyngeal swab, presence of viral mutation(s) within the areas targeted by this assay, and inadequate number of viral copies(<138 copies/mL). A negative result must be combined with clinical observations, patient history, and epidemiological information. The expected result is Negative.  Fact Sheet for Patients:  BloggerCourse.com  Fact Sheet for Healthcare Providers:  SeriousBroker.it  This test is no t yet approved or cleared by the [place] FDA and  has been authorized for detection and/or diagnosis of SARS-CoV-2 by FDA under an Emergency Use Authorization (EUA). This EUA will remain  in effect (meaning this test can be used) for the duration of the COVID-19 declaration under Section 564(b)(1) of the Act, 21 U.S.C.section 360bbb-3(b)(1), unless the authorization is terminated  or revoked sooner.       Influenza A by PCR NEGATIVE NEGATIVE Final   Influenza B by PCR NEGATIVE NEGATIVE Final    Comment: (NOTE) The Xpert Xpress SARS-CoV-2/FLU/RSV plus assay is intended as an aid in the diagnosis of influenza from Nasopharyngeal swab specimens and should not be used as a sole basis for treatment. Nasal washings and aspirates are unacceptable for Xpert Xpress SARS-CoV-2/FLU/RSV testing.  Fact Sheet for  Patients: BloggerCourse.com  Fact Sheet for Healthcare Providers: SeriousBroker.it  This test is not yet approved or cleared by the [place] FDA and has been authorized for detection and/or diagnosis of SARS-CoV-2 by FDA under an Emergency Use Authorization (EUA). This EUA will remain in effect (meaning this test can be used) for the duration of the COVID-19 declaration under Section 564(b)(1) of the Act, 21 U.S.C. section 360bbb-3(b)(1), unless the authorization is terminated or revoked.  Performed at [facility], [address], [place], [place] [address]   Urine culture     Status: Abnormal   Collection Time: 11/16/20  2:40 PM   Specimen: Urine, Clean Catch  Result Value Ref Range Status   Specimen Description   Final    URINE, CLEAN CATCH Performed at [facility], [address] [place], [place] [address]    Special Requests   Final    NONE Performed at [facility], [address], [place], [place] [address]    Culture >=100,000 COLONIES/mL ESCHERICHIA COLI (A)  Final   Report Status 11/19/2020 FINAL  Final   Organism ID, Bacteria ESCHERICHIA COLI (A)  Final      Susceptibility   Escherichia coli - MIC*    AMPICILLIN >=32 RESISTANT Resistant     CEFAZOLIN <=4 SENSITIVE Sensitive     CEFEPIME <=0.12 SENSITIVE Sensitive     CEFTRIAXONE <=0.25 SENSITIVE Sensitive     CIPROFLOXACIN >=4 RESISTANT Resistant     GENTAMICIN <=1 SENSITIVE Sensitive     IMIPENEM <=0.25 SENSITIVE Sensitive     NITROFURANTOIN <=16 SENSITIVE Sensitive     TRIMETH/SULFA >=320 RESISTANT Resistant     AMPICILLIN/SULBACTAM 16 INTERMEDIATE Intermediate     PIP/TAZO <=4 SENSITIVE Sensitive     * >=100,000 COLONIES/mL ESCHERICHIA COLI   RN Pressure Injury Documentation:     Estimated body mass index is 26.56 kg/m as calculated from the following:  Height as of this encounter: 5'  (1.524 m).   Weight as of this encounter: 61.7 kg.  Malnutrition Type:   Malnutrition Characteristics:   Nutrition Interventions:    Radiology Studies: No results found.  Scheduled Meds:   stroke: mapping our early stages of recovery book   Does not apply Once   amLODipine  10 mg Oral Daily   aspirin EC  325 mg Oral Daily   clopidogrel  75 mg Oral Daily   enoxaparin (LOVENOX) injection  30 mg Subcutaneous Q24H   insulin aspart  0-9 Units Subcutaneous TID WC   losartan  100 mg Oral Daily   melatonin  3 mg Oral QHS   metoprolol tartrate  25 mg Oral BID   sertraline  25 mg Oral Daily   traZODone  25 mg Oral QHS   Continuous Infusions:   LOS: 8 days   Merlene Laughter, DO [facility] PAGER is on AMION  If 7PM-7AM, please contact night-coverage [email or web address]

## 2020-11-25 NOTE — Care Management Important Message (Signed)
Important Message  Patient Details  Name: Heather [name] MRN: [id number] Date of Birth: 01-13-1941   Medicare Important Message Given:  Yes     Renie Ora 11/25/2020, 10:07 AM

## 2020-11-25 NOTE — Progress Notes (Signed)
Occupational Therapy Treatment Patient Details Name: Heather [name] MRN: [id number] DOB: 28-Nov-1940 Today's Date: 11/25/2020    History of present illness 80 y/o female who presents with AMS. Daughter found pt initially with pajama top buttoned around her leg, and later face down on the floor with clothing disheveled after an unwitnessed fall. 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 PMH significant for DM, CVA 2021, HTN.   OT comments  Pt progressing towards established OT goals. Pt with urinary incontinence prior to standing, performing pericare at sink with Min Guard A. Pt performing 3 step path finding task with min verbal cues to recall final step of task. Pt with decreased attention to obstacles in hallway. Pt continues to present with decreased balance, safety, awareness, and problem solving. Recommend discharge to SNF for continued OT and will continue to follow acutely to optimize safety and independence in ADLs.    Follow Up Recommendations  Supervision/Assistance - 24 hour;SNF    Equipment Recommendations  None recommended by OT    Recommendations for Other Services      Precautions / Restrictions Precautions Precautions: Fall Restrictions Weight Bearing Restrictions: No       Mobility Bed Mobility Overal bed mobility: Needs Assistance Bed Mobility: Supine to Sit     Supine to sit: Min guard     General bed mobility comments: Pt in recliner upon arrival    Transfers Overall transfer level: Needs assistance Equipment used: Rolling walker (2 wheeled) Transfers: Sit to/from Stand Sit to Stand: Min guard Stand pivot transfers: Min guard       General transfer comment: cues for safety    Balance Overall balance assessment: Needs assistance Sitting-balance support: Feet supported;No upper extremity  supported Sitting balance-Leahy Scale: Fair     Standing balance support: Bilateral upper extremity supported;During functional activity Standing balance-Leahy Scale: Fair Standing balance comment: reliantb on external support, Min A with attempted shower transfer.                           ADL either performed or assessed with clinical judgement   ADL Overall ADL's : Needs assistance/impaired     Grooming: Brushing hair;Sitting;Modified independent Grooming Details (indicate cue type and reason): Pt brushing hair on arrival                 Toilet Transfer: Ambulation;Comfort height toilet;RW;Min guard Toilet Transfer Details (indicate cue type and reason): Min Guard A for safety during simulated toilet transfer from recliner Toileting- Clothing Manipulation and Hygiene: Sit to/from stand;Min guard Toileting - Clothing Manipulation Details (indicate cue type and reason): Min Guard A for pericare. Pt with uninary incontinence sitting in chair, requiring cues to problem solve need to clean up prior to exiting room for initially agreed upon path finding task. Min cues to follow command to stand at sink to clean perineal area.   Tub/Shower Transfer Details (indicate cue type and reason): Attempting tub transfer, but deferring today, pt unable to bend at knee to perform tub transfer. Functional mobility during ADLs: Min guard;Rolling walker;Cueing for safety General ADL Comments: Pt requiring cues for safety during transfers. Min cues for memory. Pt with head turn to R side with functional mobility in hallway. Decreased attention overall coming extremely close to objects in hallway.     Vision   Vision Assessment?: Vision impaired- to  be further tested in functional context   Perception     Praxis      Cognition Arousal/Alertness: Awake/alert Behavior During Therapy: WFL for tasks assessed/performed Overall Cognitive Status: Impaired/Different from baseline Area of  Impairment: Attention;Following commands;Memory;Awareness;Safety/judgement;Problem solving                   Current Attention Level: Sustained Memory: Decreased short-term memory Following Commands: Follows multi-step commands with increased time;Follows one step commands with increased time;Follows one step commands consistently Safety/Judgement: Decreased awareness of safety;Decreased awareness of deficits Awareness: Emergent Problem Solving: Slow processing;Difficulty sequencing;Requires verbal cues General Comments: Pt with emergent awareness, urinary urgency after removal of purewick and unable to make it to restroom. Pt additionally with greater awareness of difficulty with memory, stating she needs to work on it to get out of here. Pt performing threee step path finding task with one verbal questioning cue to recall last step of sequence. Pt with decreased attention overall and with head turn toward R with functional mobility. Pt additionally with inconsistent report of type of shower she has at home.        Exercises     Shoulder Instructions       General Comments Pt pleasant and conversational throughout. Reporting she does not like having a female nurse, although nurse and nurse tech today are female.    Pertinent Vitals/ Pain       Pain Assessment: Faces Faces Pain Scale: No hurt Pain Intervention(s): Monitored during session  Home Living                                          Prior Functioning/Environment              Frequency  Min 2X/week        Progress Toward Goals  OT Goals(current goals can now be found in the care plan section)  Progress towards OT goals: Progressing toward goals  Acute Rehab OT Goals Patient Stated Goal: to walk more OT Goal Formulation: With patient Time For Goal Achievement: 12/02/20 Potential to Achieve Goals: Good ADL Goals Pt Will Perform Grooming: with modified independence;standing Pt Will  Perform Upper Body Dressing: with modified independence;standing Pt Will Perform Lower Body Dressing: with modified independence;sit to/from stand Pt Will Transfer to Toilet: with modified independence;ambulating;regular height toilet Pt Will Perform Toileting - Clothing Manipulation and hygiene: with modified independence;sit to/from stand Additional ADL Goal #1: Pt will perform 3 part path finding task with min verbal cues.  Plan Discharge plan remains appropriate    Co-evaluation                 AM-PAC OT "6 Clicks" Daily Activity     Outcome Measure   Help from another person eating meals?: A Little Help from another person taking care of personal grooming?: A Little Help from another person toileting, which includes using toliet, bedpan, or urinal?: A Little Help from another person bathing (including washing, rinsing, drying)?: A Little Help from another person to put on and taking off regular upper body clothing?: A Little Help from another person to put on and taking off regular lower body clothing?: A Little 6 Click Score: 18    End of Session Equipment Utilized During Treatment: Gait belt;Rolling walker  OT Visit Diagnosis: Unsteadiness on feet (R26.81);Muscle weakness (generalized) (M62.81);Other symptoms and signs involving cognitive function   Activity  Tolerance Patient tolerated treatment well   Patient Left in chair;with call bell/phone within reach;with chair alarm set   Nurse Communication Mobility status        Time: 1950-9326 OT Time Calculation (min): 36 min  Charges: OT General Charges $OT Visit: 1 Visit OT Treatments $Self Care/Home Management : 23-37 mins  Ladene Artist, OTDS    Ladene Artist 11/25/2020, 1:11 PM

## 2020-11-25 NOTE — Plan of Care (Signed)
  Problem: Education: Goal: Knowledge of General Education information will improve Description: Including pain rating scale, medication(s)/side effects and non-pharmacologic comfort measures Outcome: Progressing   Problem: Clinical Measurements: Goal: Ability to maintain clinical measurements within normal limits will improve Outcome: Progressing Goal: Will remain free from infection Outcome: Progressing Goal: Diagnostic test results will improve Outcome: Progressing Goal: Respiratory complications will improve Outcome: Progressing Goal: Cardiovascular complication will be avoided Outcome: Progressing   Problem: Ischemic Stroke/TIA Tissue Perfusion: Goal: Complications of ischemic stroke/TIA will be minimized Outcome: Progressing   

## 2020-11-25 NOTE — TOC Progression Note (Signed)
Transition of Care [facility]) - Progression Note    Patient Details  Name: Heather [name] MRN: [id number] Date of Birth: 05-06-1941  Transition of Care [facility] [address]) CM/SW Contact  Baldemar Lenis, [place] Phone Number: 11/25/2020, 2:10 PM  Clinical Narrative:   CSW confirmed with patient's daughter choice of SNF of [facility]. CSW confirmed with [facility] bed availability for tomorrow. CSW submitted request for insurance authorization. CSW to follow.    Expected Discharge Plan: Skilled Nursing Facility Barriers to Discharge: Continued Medical Work up, English as a second language teacher  Expected Discharge Plan and Services Expected Discharge Plan: Skilled Nursing Facility     Post Acute Care Choice: Skilled Nursing Facility Living arrangements for the past 2 months: Single Family Home                                       Social Determinants of Health (SDOH) Interventions    Readmission Risk Interventions No flowsheet data found.

## 2020-11-26 LAB — GLUCOSE, CAPILLARY
Glucose-Capillary: 117 mg/dL — ABNORMAL HIGH (ref 70–99)
Glucose-Capillary: 162 mg/dL — ABNORMAL HIGH (ref 70–99)
Glucose-Capillary: 184 mg/dL — ABNORMAL HIGH (ref 70–99)
Glucose-Capillary: 180 mg/dL — ABNORMAL HIGH (ref 70–99)

## 2020-11-26 LAB — PHOSPHORUS: Phosphorus: 4.8 mg/dL — ABNORMAL HIGH (ref 2.5–4.6)

## 2020-11-26 LAB — COMPREHENSIVE METABOLIC PANEL
ALT: 57 U/L — ABNORMAL HIGH (ref 0–44)
AST: 44 U/L — ABNORMAL HIGH (ref 15–41)
Albumin: 2.7 g/dL — ABNORMAL LOW (ref 3.5–5.0)
Alkaline Phosphatase: 150 U/L — ABNORMAL HIGH (ref 38–126)
Anion gap: 3 — ABNORMAL LOW (ref 5–15)
BUN: 29 mg/dL — ABNORMAL HIGH (ref 8–23)
CO2: 29 mmol/L (ref 22–32)
Calcium: 8.8 mg/dL — ABNORMAL LOW (ref 8.9–10.3)
Chloride: 104 mmol/L (ref 98–111)
Creatinine, Ser: 1.52 mg/dL — ABNORMAL HIGH (ref 0.44–1.00)
GFR, Estimated: 35 mL/min — ABNORMAL LOW (ref >60)
Glucose, Bld: 117 mg/dL — ABNORMAL HIGH (ref 70–99)
Potassium: 4.7 mmol/L (ref 3.5–5.1)
Sodium: 136 mmol/L (ref 135–145)
Total Bilirubin: 0.7 mg/dL (ref 0.3–1.2)
Total Protein: 5.3 g/dL — ABNORMAL LOW (ref 6.5–8.1)

## 2020-11-26 LAB — RESP PANEL BY RT-PCR (FLU A&B, COVID) ARPGX2
Influenza A by PCR: NEGATIVE
Influenza B by PCR: NEGATIVE
SARS Coronavirus 2 by RT PCR: NEGATIVE

## 2020-11-26 LAB — MAGNESIUM: Magnesium: 2.2 mg/dL (ref 1.7–2.4)

## 2020-11-26 MED ORDER — CLOPIDOGREL BISULFATE 75 MG PO TABS: 75.0000 mg | ORAL_TABLET | Freq: Every day | ORAL | 2 refills | Status: AC

## 2020-11-26 MED ORDER — AMLODIPINE BESYLATE 10 MG PO TABS: 10.0000 mg | ORAL_TABLET | Freq: Every day | ORAL | Status: AC

## 2020-11-26 MED ORDER — ASPIRIN 325 MG PO TBEC: 325.0000 mg | DELAYED_RELEASE_TABLET | Freq: Every day | ORAL | 0 refills | Status: AC

## 2020-11-26 MED ORDER — TRAZODONE HCL 50 MG PO TABS: 25.0000 mg | ORAL_TABLET | Freq: Every day | ORAL | 0 refills | Status: AC

## 2020-11-26 MED ORDER — SENNOSIDES-DOCUSATE SODIUM 8.6-50 MG PO TABS: 1.0000 | ORAL_TABLET | Freq: Every evening | ORAL | 0 refills | Status: AC | PRN

## 2020-11-26 MED ORDER — MELATONIN 3 MG PO TABS: 3.0000 mg | ORAL_TABLET | Freq: Every day | ORAL | 0 refills | Status: AC

## 2020-11-26 NOTE — Discharge Summary (Signed)
Physician Discharge Summary  Heather [name] OFB:[id number] DOB: 27-Jun-1940 DOA: 11/16/2020  PCP: Caesar Bookman, NP  Admit date: 11/16/2020 Discharge date: 11/26/2020  Admitted From: Home Disposition: SNF  Recommendations for Outpatient Follow-up:  Follow up with PCP in 1-2 weeks Follow up with Neurology within 4 weeks. Has a Stroke Clinic Appointment with [facility] on 12/26/20 Please obtain CMP/CBC, Mag, Phos in one week Please follow up on the following pending results:  Home Health: No Equipment/Devices: None  Discharge Condition: Stable  CODE STATUS: DO NOT RESUSCITATE Diet recommendation: Heart Healthy Diet   Brief/Interim Summary: The patient is a 80 year old Caucasian female with a past medical history significant for but not limited to essential hypertension, hyperlipidemia, history of ischemic CVA in 2021, diabetes mellitus type 2 as well as other comorbidities who presented to [facility] long ED on 11/16/2020 after a fall and speech and cognitive issues over the last few weeks.  Daughter was at bedside and contributed to the HPI and stated that her mother had not been acting right after being diagnosed with COVID-19 3 weeks prior.  Patient's daughter states that the patient stating that did not make sense and apparently had a short that was buttoned around her leg last night.  The patient's daughter found the patient in her closet on the floor with her short half-life and her pants down around her knees and unclear if his syncopal episode.  Daughter did not notice any weakness or facial droop but reported the patient did have some memory issues since her ischemic stroke yesterday.  Patient has reportedly increased urinary frequency and urgency but no dysuria.  Pulmonary febrile to the ED she had basic blood work done and urinalysis showed large leukocytes, negative nitrites, many bacteria and greater than 50 WBCs.  She is empirically started on antibiotics and head CT done showed no acute  intracranial pathology but she underwent a MRI of the brain which showed acute infarct of the juxtacortical right frontal lobe with possible subacute infarcts as well.  Neurology was consulted and they recommended dual antiplatelet therapy with ASA 325 mg po Daily and Plavix 75 mg Daily x 3 months then just ASA alone.  TRH was consulted and admitted this patient for acute CVA and UTI.  Subsequently she improved from a neuro standpoint.  Patient still little confused however PT OT recommending CIR.  CIR working the patient up and is unclear of the patient's disposition at this time but they have arranging a meeting with the patient's daughter long-term placement liaison to discuss placement options and now the plan is for the patient to go to SNF and is medically stable to D/C.   Discharge Diagnoses:  Principal Problem:   Acute CVA (cerebrovascular accident) [facility]) Active Problems:   Essential hypertension   Hyperlipidemia LDL goal <100   Type 2 diabetes mellitus without complication, without long-term current use of insulin (HCC)   Urinary tract infection   CKD (chronic kidney disease), stage III (HCC)  Acute Metabolic Encephalopathy, POA, improving and waxing and waning -Family reports patient has been acting more confused over the last 3 weeks, after recent diagnosis of COVID-19 viral infection.   -Also reported patient with memory issues since previous ischemic CVA 2021.   -Patient was found down at home with reports of her shirt buttoned onto her leg. -Ammonia level within normal limits, patient afebrile without leukocytosis.   -TSH 2.553, within normal limits. -B12 slightly low at 391.   -Head imaging with acute and subacute infarcts  as well as urinalysis suggestive of a urinary tract infection. -Continue Treatment as below -Delirium Precautions -Supportive care; continues to be intermittently confused but easily reoriented   Acute Ischemic CVA -Patient was presenting [facility] ED after  being found down at home by family.  Per family report, patient has not been acting right with worsening confusion over the last 3 weeks; following recent diagnosis of COVID-19 viral infection.   -CT head without contrast with no acute intracranial findings but notable for advanced small vessel white matter disease and encephalomalacia of the right caudate/corona radiata consistent with prior CVA.   -MR brain with acute infarct just a cortical right frontal lobe with subacute infarct left corpus callosum/occipital lobe.   -MRA head/neck with advanced intracranial atherosclerosis with multifocal high-grade narrowing which is progressed from 2021 and chronically diminished flow left vertebral artery due to none resolved proximal stenosis.   -Hemoglobin A1c 6.6, LDL 167.   -TTE with LVEF 60-75% grade 1 diastolic dysfunction.   -EEG with diffuse mild encephalopathy nonspecific without seizure or epileptiform discharges. -Neurologywas following, appreciate assistance -Restarted metoprolol tartrate 25 mg p.o. twice daily and losartan 100 mg p.o. daily after allowing permissive hypertension for 48 hours -Per Neuro Recc's C/w DAPT w/ Plavix 75mg  daily and aspirin 325 mg daily x 3 Months days followed by aspirin alone -Currently Holding atorvastatin until LFTs normalize -PT/OT recommending CIR but now will go to SNF -Continue monitor on Telemetry while hospitalized    E. coli Urinary tract infection -Patient reported increased urinary frequency/urgency without dysuria.   -Urinalysis with cloudy appearance and small hemoglobin on urine dipstick, large leukocytes, negative nitrites, many bacteria, greater than 50 RBCs per high-power field, 0-5 squamous epithelial cells, and greater than 50 WBCs -This could also be a contributing factor to her worsening confusion over the last 3 weeks.   -Urine culture with > 100,000 CFU of E Coli with resistance to TMP-SMX, ciprofloxacin, and ampicillin with intermediate  sensitivities to ampicillin/sulbactam with sensitivities to the rest -Transitioned IV Ceftriaxone to Keflex based on culture susceptibilities to complete 5-day course and she is completed this now   Abnormal LFTs, improving -AST went from 43 -> 104 -> 100 -> 75 -> 66 -> 72 -> 63 -> 52 -> 44 -ALT went from 36 -> 80 -> 96 -> 92 -> 75 -> 81 -> 76 -> 68 -> 57 -Unclear etiology, possible mild acute liver injury from unknown downtime at home.  Given need for increased statin dose for CVA, will trend LFTs. -T Bili was normal at 0.7 today -If continues to worsen or not improve will obtain RUQ U/S and Acute Hepatitis Panel; but now improving   -Continue to Monitor and Trend Hepatic Fxn Panel and repeat CMP in the AM -Resume statin once LFTs are close to normal   Hypokalemia -Improved.  Patient's potassium is now 4.7 and magnesium is 2.2 -Continue to monitor and replete as necessary -Repeat CMP in a.m.   CKD Stage 3b Hyperphosphatemia -Stable. Patient's BUN/Cr went from 24/1.60 -> 25/1.59 -> 20/1.48 ->  27/1.66 -> 29/1.62 -> 29/1.50 -> 31/1.74 -> 30/1.43 -> 29/1.52 -Patient's Phos Level is now gone from 4.7 -> 4.0 -> 4.8 -Avoid nephrotoxic medications, contrast dyes, hypotension renally dose medications -Repeat CMP in a.m.   Essential Hypertension -Home regimen includes losartan 100 mg p.o. daily, metoprolol tartrate 25 mg p.o. twice daily. -Restarted home Metoprolol 25 mg p.o. twice daily and Losartan 100 mg p.o. daily after allowing permissive hypertension over the last  previous 48 hours -Will add Amlodipine 10 mg po Daily -Stopped Hydralazine 25 mg p.o. q6h prn SBP >220 or DBP >110 and changed to IV Hydralazine 10 mg IV q6hprn SBP >180 or DBP >100 -C/w Labetalol 10 mg q2h as needed for SBP >220 or DBP>110 -Continue to Monitor BP per Protocol -Last BP was 160/59   Hyperlipidemia -Lipid Panel done and showed a total cholesterol/HDL ratio 6.3, cholesterol level 227, HDL 36, LDL 167,  triglycerides 120, VLDL 24 -Goal HDL less than 70 with acute CVA. -Continue Holding Atorvastatin until LFTs normalize but will resume at D/C at 40 mg and needs to be uptitrated to 80 mg po Daily once LFTs normalize   Type 2 Diabetes Mellitus -Hemoglobin A1c  was checked and 6.6 on 11/16/20, well controlled.  On metformin 500 mg p.o. daily at home. -Hold oral hypoglycemics while inpatient -C/w Sensitive Novolog SSI AC for coverage -CBG's ranging from 117-208 -CBG before every meal/at bedtime   Depression and Anxiety -Continue Sertraline 25 mg p.o. daily   Insomnia -C/w Melatonin 3 mg po qHS and Trazodone 25 mg po qHS   GOC -DNR, poA  Discharge Instructions  Discharge Instructions     Call MD for:  difficulty breathing, headache or visual disturbances   Complete by: As directed    Call MD for:  extreme fatigue   Complete by: As directed    Call MD for:  hives   Complete by: As directed    Call MD for:  persistant dizziness or light-headedness   Complete by: As directed    Call MD for:  persistant nausea and vomiting   Complete by: As directed    Call MD for:  redness, tenderness, or signs of infection (pain, swelling, redness, odor or green/yellow discharge around incision site)   Complete by: As directed    Call MD for:  severe uncontrolled pain   Complete by: As directed    Call MD for:  temperature >100.4   Complete by: As directed    Diet - low sodium heart healthy   Complete by: As directed    Diet Carb Modified   Complete by: As directed    Discharge instructions   Complete by: As directed    You were cared for by a hospitalist during your hospital stay. If you have any questions about your discharge medications or the care you received while you were in the hospital after you are discharged, you can call the unit and ask to speak with the hospitalist on call if the hospitalist that took care of you is not available. Once you are discharged, your primary care physician  will handle any further medical issues. Please note that NO REFILLS for any discharge medications will be authorized once you are discharged, as it is imperative that you return to your primary care physician (or establish a relationship with a primary care physician if you do not have one) for your aftercare needs so that they can reassess your need for medications and monitor your lab values.  Follow up with PCP and Neurology as an outpatient. Take all medications as prescribed. If symptoms change or worsen please return to the ED for evaluation   Increase activity slowly   Complete by: As directed       Allergies as of 11/26/2020       Reactions   Iohexol     Desc: Shaking and sore throat, Onset Date: [id number]   Iohexol Other (See Comments)  Reaction not recalled        Medication List     STOP taking these medications    metFORMIN 500 MG tablet Commonly known as: GLUCOPHAGE       TAKE these medications    acetaminophen 500 MG tablet Commonly known as: TYLENOL Take 500 mg by mouth every 6 (six) hours as needed for mild pain, fever or headache.   amLODipine 10 MG tablet Commonly known as: NORVASC Take 1 tablet (10 mg total) by mouth daily. Start taking on: November 27, 2020   aspirin 325 MG EC tablet Take 1 tablet (325 mg total) by mouth daily. Start taking on: November 27, 2020   atorvastatin 40 MG tablet Commonly known as: LIPITOR Take 40 mg by mouth daily.   clopidogrel 75 MG tablet Commonly known as: PLAVIX Take 1 tablet (75 mg total) by mouth daily. Start taking on: November 27, 2020   fluticasone 50 MCG/ACT nasal spray Commonly known as: FLONASE Place 1-2 sprays into both nostrils daily as needed for allergies or rhinitis.   losartan 100 MG tablet Commonly known as: COZAAR Take 1 tablet (100 mg total) by mouth daily.   melatonin 3 MG Tabs tablet Take 1 tablet (3 mg total) by mouth at bedtime. What changed:  medication strength how much to take when to  take this reasons to take this   metoprolol tartrate 25 MG tablet Commonly known as: LOPRESSOR Take 1 tablet by mouth twice daily   senna-docusate 8.6-50 MG tablet Commonly known as: Senokot-S Take 1 tablet by mouth at bedtime as needed for moderate constipation.   sertraline 25 MG tablet Commonly known as: ZOLOFT TAKE 1 TABLET BY MOUTH AT BEDTIME What changed: when to take this   traZODone 50 MG tablet Commonly known as: DESYREL Take 0.5 tablets (25 mg total) by mouth at bedtime.   VITAMIN C PO Take 1 tablet by mouth daily.   ZINC PO Take 1 tablet by mouth daily.        Contact information for follow-up providers     Ihor Austin, NP. Go on 12/26/2020.   Specialty: Neurology Why: stroke clinic Contact information: [address] [place] [address] [phone]              Contact information for after-discharge care     Destination     [facility] .   Service: Skilled Nursing Contact information: [address] [place] [address] [phone]                    Allergies  Allergen Reactions   Iohexol      Desc: Shaking and sore throat, Onset Date: [id number]    Iohexol Other (See Comments)    Reaction not recalled   Consultations: Neurology  Procedures/Studies: CT Head Wo Contrast  Result Date: 11/16/2020 CLINICAL DATA:  Fall, altered mental status, confusion, headache EXAM: CT HEAD WITHOUT CONTRAST TECHNIQUE: Contiguous axial images were obtained from the base of the skull through the vertex without intravenous contrast. COMPARISON:  01/17/2020 FINDINGS: Brain: No evidence of acute infarction, hemorrhage, hydrocephalus, extra-axial collection or mass lesion/mass effect. Extensive periventricular and deep white matter hypodensity. Encephalomalacia of the right caudate and adjacent corona radiata (series 2, image 15). Vascular: No hyperdense  vessel or unexpected calcification. Skull: Normal. Negative for fracture or focal lesion. Sinuses/Orbits: No acute finding. Other: None. IMPRESSION: 1. No acute intracranial pathology. 2. Advanced small-vessel white  matter disease. Encephalomalacia of the right caudate and adjacent corona radiata, in keeping with prior infarction. Electronically Signed   By: Lauralyn Primes M.D.   On: 11/16/2020 14:23   MR ANGIO HEAD WO CONTRAST  Result Date: 11/17/2020 CLINICAL DATA:  Stroke follow-up EXAM: MRA HEAD WITHOUT CONTRAST TECHNIQUE: Angiographic images of the Circle of Willis were acquired using MRA technique without intravenous contrast. COMPARISON:  01/17/2020 FINDINGS: Anterior circulation: The carotid siphons are diffusely patent. Aplastic or hypoplastic left A1 segment. Atheromatous irregularity of bilateral MCA branches which is prominent. Chronic high-grade apearing left ICA terminus stenosis. Moderate to advanced left M2 branch stenosis. Posterior circulation: Chronic occlusion of the left vertebral artery with faint distal V4 segment flow which could be retrograde. There is multifocal atheromatous irregularity of the right vertebral and basilar artery with tandem high-grade basilar stenosis. Proximal basilar narrowing has progressed. There is a small basilar in the setting of fetal type bilateral PCA flow. Moderate bilateral PCA atheromatous irregularity and narrowing which appears progressed Anatomic variants: As above IMPRESSION: Advanced intracranial atherosclerosis with multifocal high-grade narrowing that is progressed from 2021. Electronically Signed   By: Marnee Spring M.D.   On: 11/17/2020 08:43   MR ANGIO NECK WO CONTRAST  Result Date: 11/17/2020 CLINICAL DATA:  Stroke follow-up EXAM: MRA NECK WITHOUT CONTRAST TECHNIQUE: Angiographic images of the neck were acquired using MRA technique without intravenous contrast. Carotid stenosis measurements (when applicable) are obtained utilizing NASCET  criteria, using the distal internal carotid diameter as the denominator. COMPARISON:  01/17/2020 FINDINGS: Aortic arch: Limited coverage is negative. Right carotid system: No stenosis or beading seen. Left carotid system: No stenosis or beading seen. Vertebral arteries: Covered proximal subclavian arteries are patent but significantly motion degraded evaluation. Codominant vertebral arteries are patent to the dura. The left vertebral shows less intense flow by 3D time-of-flight, but is still patent in the neck and actually improved from prior IMPRESSION: 1. No emergent finding or change from 2021. 2. Chronically diminished flow in the left vertebral artery likely due to non resolved proximal stenosis. Electronically Signed   By: Marnee Spring M.D.   On: 11/17/2020 09:05   MR BRAIN W WO CONTRAST  Result Date: 11/16/2020 EXAM: MRI HEAD WITHOUT AND WITH CONTRAST TECHNIQUE: Multiplanar, multiecho pulse sequences of the brain and surrounding structures were obtained without and with intravenous contrast. CONTRAST:  6mL GADAVIST GADOBUTROL 1 MMOL/ML IV SOLN COMPARISON:  Same day CT head and MRI from 01/17/2020. FINDINGS: Motion limited evaluation.  Within this limitation: Brain: Restricted diffusion in the juxtacortical right frontal lobe without enhancement, compatible with acute infarct. Additional milder DWI hyperintensity with milder restricted diffusion in the left aspect of the splenium of the corpus callosum and left occipital lobe. Evolution of the previously seen right basal ganglia and corona radiata infarcts with encephalomalacia in these areas. There is extensive patchy and confluent additional T2/FLAIR hyperintensities within the supratentorial and pontine white matter, most likely related to chronic microvascular ischemic disease. Small remote infarct in the left frontal white matter. No hydrocephalus. Wallerian degeneration of the right midbrain. No evidence of acute hemorrhage. No extra-axial fluid  collection. No abnormal enhancement. 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 The other major arterial flow voids are maintained at the skull base. Skull and upper cervical spine: Normal marrow signal. Sinuses/Orbits: Visualized sinuses are clear. No acute orbital findings. Other: Small left mastoid effusion. IMPRESSION: 1. Acute infarct in the juxtacortical  right frontal lobe. Additional areas of milder restricted diffusion in the left aspect of the splenium of the corpus callosum and the left occipital lobe may represent subacute infarcts. 2. Evolution of the previously seen right basal ganglia and corona radiata infarcts with encephalomalacia in these areas. The 3. Severe chronic microvascular ischemic disease. 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 Electronically Signed   By: Feliberto Harts MD   On: 11/16/2020 16:32   EEG adult  Result Date: 11/18/2020 Charlsie Quest, MD     11/18/2020 10:57 AM Patient Name: TRENA DUNAVAN MRN: [id number] Epilepsy Attending: Charlsie Quest Referring Physician/Provider: Jimmye Norman, NP Date: 11/18/2020 Duration: 23.12 mins Patient history: 80 year old female with COVID 3 weeks ago who was found confused on the floor with nonsensical speech.  EEG to evaluate for seizures. Level of alertness: Awake AEDs during EEG study: None Technical aspects: This EEG study was done with scalp electrodes positioned according to the 10-20 International system of electrode placement. Electrical activity was acquired at a sampling rate of  and reviewed with a high frequency filter of  and a low frequency filter of . EEG data were recorded continuously and digitally stored. Description: The posterior dominant rhythm consists of 10 Hz activity of moderate voltage (25-35 uV) seen predominantly in posterior head regions,  symmetric and reactive to eye opening and eye closing. EEG showed intermittent generalized 3 to 6 Hz theta-delta slowing. Hyperventilation and photic stimulation were not performed.   ABNORMALITY - Intermittent slow, generalized IMPRESSION: This study is suggestive of mild diffuse encephalopathy, nonspecific etiology. No seizures or epileptiform discharges were seen throughout the recording. Charlsie Quest   ECHOCARDIOGRAM COMPLETE  Result Date: 11/17/2020    ECHOCARDIOGRAM REPORT   Patient Name:   CARTINA BROUSSEAU [facility] Date of Exam: 11/17/2020 Medical Rec #:  [id number]      Height:       60.0 in Accession #:    [id number]     Weight:       136.0 lb Date of Birth:  [birth year]      BSA:          1.584 m Patient Age:    59 years       BP:           147/104 mmHg Patient Gender: F              HR:           63 bpm. Exam Location:  Inpatient Procedure: 2D Echo, Cardiac Doppler and Color Doppler Indications:    CVA  History:        Patient has prior history of Echocardiogram examinations, most                 recent 01/18/2020. Stroke; Risk Factors:Diabetes, Dyslipidemia and                 Hypertension.  Sonographer:    Neomia Dear RDCS Referring Phys: [id number] ALLISON WOLFE IMPRESSIONS  1. Left ventricular ejection fraction, by estimation, is 70 to 75%. The left ventricle has hyperdynamic function. The left ventricle has no regional wall motion abnormalities. Left ventricular diastolic parameters are consistent with Grade I diastolic dysfunction (impaired relaxation).  2. Right ventricular systolic function is normal. The right ventricular size is normal. There is normal pulmonary artery systolic pressure.  3. The mitral valve is normal in structure. Trivial mitral valve regurgitation.  4.  The aortic valve is grossly normal. Aortic valve regurgitation is not visualized. No aortic stenosis is present. FINDINGS  Left Ventricle: Left ventricular ejection fraction, by estimation, is 70 to 75%. The left ventricle has  hyperdynamic function. The left ventricle has no regional wall motion abnormalities. The left ventricular internal cavity size was small. There is no  left ventricular hypertrophy. Left ventricular diastolic parameters are consistent with Grade I diastolic dysfunction (impaired relaxation). Right Ventricle: The right ventricular size is normal. Right vetricular wall thickness was not well visualized. Right ventricular systolic function is normal. There is normal pulmonary artery systolic pressure. The tricuspid regurgitant velocity is 2.59 m/s, and with an assumed right atrial pressure of 3 mmHg, the estimated right ventricular systolic pressure is 29.8 mmHg. Left Atrium: Left atrial size was normal in size. Right Atrium: Right atrial size was normal in size. Pericardium: There is no evidence of pericardial effusion. Mitral Valve: The mitral valve is normal in structure. Trivial mitral valve regurgitation. Tricuspid Valve: The tricuspid valve is grossly normal. Tricuspid valve regurgitation is mild. Aortic Valve: The aortic valve is grossly normal. Aortic valve regurgitation is not visualized. No aortic stenosis is present. Aortic valve mean gradient measures 3.0 mmHg. Aortic valve peak gradient measures 4.5 mmHg. Aortic valve area, by VTI measures 3.26 cm. Pulmonic Valve: The pulmonic valve was grossly normal. Pulmonic valve regurgitation is not visualized. Aorta: The aortic root and ascending aorta are structurally normal, with no evidence of dilitation. IAS/Shunts: The interatrial septum was not well visualized.  LEFT VENTRICLE PLAX 2D LVIDd:         5.00 cm     Diastology LVIDs:         2.10 cm     LV e' medial:    3.15 cm/s LV PW:         1.00 cm     LV E/e' medial:  19.5 LV IVS:        0.90 cm     LV e' lateral:   5.22 cm/s LVOT diam:     2.20 cm     LV E/e' lateral: 11.8 LV SV:         80 LV SV Index:   51 LVOT Area:     3.80 cm  LV Volumes (MOD) LV vol d, MOD A2C: 17.1 ml LV vol d, MOD A4C: 47.5 ml LV vol  s, MOD A2C: 8.8 ml LV vol s, MOD A4C: 15.6 ml LV SV MOD A2C:     8.3 ml LV SV MOD A4C:     47.5 ml LV SV MOD BP:      19.1 ml RIGHT VENTRICLE RV S prime:     8.38 cm/s TAPSE (M-mode): 2.4 cm LEFT ATRIUM             Index LA Vol (A2C):   11.9 ml 7.51 ml/m LA Vol (A4C):   43.1 ml 27.20 ml/m LA Biplane Vol: 24.5 ml 15.46 ml/m  AORTIC VALVE                   PULMONIC VALVE AV Area (Vmax):    2.86 cm    PV Vmax:       0.92 m/s AV Area (Vmean):   2.87 cm    PV Vmean:      61.100 cm/s AV Area (VTI):     3.26 cm    PV VTI:        0.213 m AV Vmax:  106.00 cm/s PV Peak grad:  3.4 mmHg AV Vmean:          74.200 cm/s PV Mean grad:  2.0 mmHg AV VTI:            0.246 m AV Peak Grad:      4.5 mmHg AV Mean Grad:      3.0 mmHg LVOT Vmax:         79.80 cm/s LVOT Vmean:        56.000 cm/s LVOT VTI:          0.211 m LVOT/AV VTI ratio: 0.86  AORTA Ao Root diam: 2.70 cm Ao Asc diam:  2.90 cm MITRAL VALVE               TRICUSPID VALVE MV Area (PHT): 2.55 cm    TR Peak grad:   26.8 mmHg MV Decel Time: 298 msec    TR Vmax:        259.00 cm/s MV E velocity: 61.40 cm/s MV A velocity: 86.70 cm/s  SHUNTS MV E/A ratio:  0.71        Systemic VTI:  0.21 m                            Systemic Diam: 2.20 cm Kristeen Miss MD Electronically signed by Kristeen Miss MD Signature Date/Time: 11/17/2020/12:31:16 PM    Final     EEG ABNORMALITY - Intermittent slow, generalized   IMPRESSION: This study is suggestive of mild diffuse encephalopathy, nonspecific etiology. No seizures or epileptiform discharges were seen throughout the recording.  Subjective: Seen and Examined at bedside and is doing well.  Denies any chest pain, shortness breath, lightheadedness or dizziness.  Ready to go to SNF.  No other concerns or complaints at this time.  Discharge Exam: Vitals:   11/26/20 0815 11/26/20 1119  BP: (!) 171/60 (!) 160/59  Pulse: (!) 54 (!) 50  Resp: 18 18  Temp: 98.2 F (36.8 C) (!) 97.5 F (36.4 C)  SpO2: 99% 95%   Vitals:    11/25/20 2352 11/26/20 0344 11/26/20 0815 11/26/20 1119  BP: (!) 132/53 (!) 146/76 (!) 171/60 (!) 160/59  Pulse: (!) 57 (!) 50 (!) 54 (!) 50  Resp: Temp: 97.8 F (36.6 C) 97.8 F (36.6 C) 98.2 F (36.8 C) (!) 97.5 F (36.4 C)  TempSrc: Oral Oral Oral Oral  SpO2: 98% 99% 99% 95%  Weight:      Height:       General: Pt is alert, awake, not in acute distress Cardiovascular: RRR, S1/S2 +, no rubs, no gallops Respiratory: Diminished bilaterally, no wheezing, no rhonchi Abdominal: Soft, NT, ND, bowel sounds + Extremities: no edema, no cyanosis  The results of significant diagnostics from this hospitalization (including imaging, microbiology, ancillary and laboratory) are listed below for reference.     Microbiology: Recent Results (from the past 240 hour(s))  Resp Panel by RT-PCR (Flu A&B, Covid) Nasopharyngeal Swab     Status: None   Collection Time: 11/16/20  1:54 PM   Specimen: Nasopharyngeal Swab; Nasopharyngeal(NP) swabs in vial transport medium  Result Value Ref Range Status   SARS Coronavirus 2 by RT PCR NEGATIVE NEGATIVE Final    Comment: (NOTE) SARS-CoV-2 target nucleic acids are NOT DETECTED.  The SARS-CoV-2 RNA is generally detectable in upper respiratory specimens during the acute phase of infection. The lowest concentration of SARS-CoV-2 viral copies this assay can detect is  138 copies/mL. A negative result does not preclude SARS-Cov-2 infection and should not be used as the sole basis for treatment or other patient management decisions. A negative result may occur with  improper specimen collection/handling, submission of specimen other than nasopharyngeal swab, presence of viral mutation(s) within the areas targeted by this assay, and inadequate number of viral copies(<138 copies/mL). A negative result must be combined with clinical observations, patient history, and epidemiological information. The expected result is Negative.  Fact Sheet for  Patients:  BloggerCourse.com  Fact Sheet for Healthcare Providers:  SeriousBroker.it  This test is no t yet approved or cleared by the [place] FDA and  has been authorized for detection and/or diagnosis of SARS-CoV-2 by FDA under an Emergency Use Authorization (EUA). This EUA will remain  in effect (meaning this test can be used) for the duration of the COVID-19 declaration under Section 564(b)(1) of the Act, 21 U.S.C.section 360bbb-3(b)(1), unless the authorization is terminated  or revoked sooner.       Influenza A by PCR NEGATIVE NEGATIVE Final   Influenza B by PCR NEGATIVE NEGATIVE Final    Comment: (NOTE) The Xpert Xpress SARS-CoV-2/FLU/RSV plus assay is intended as an aid in the diagnosis of influenza from Nasopharyngeal swab specimens and should not be used as a sole basis for treatment. Nasal washings and aspirates are unacceptable for Xpert Xpress SARS-CoV-2/FLU/RSV testing.  Fact Sheet for Patients: BloggerCourse.com  Fact Sheet for Healthcare Providers: SeriousBroker.it  This test is not yet approved or cleared by the [place] FDA and has been authorized for detection and/or diagnosis of SARS-CoV-2 by FDA under an Emergency Use Authorization (EUA). This EUA will remain in effect (meaning this test can be used) for the duration of the COVID-19 declaration under Section 564(b)(1) of the Act, 21 U.S.C. section 360bbb-3(b)(1), unless the authorization is terminated or revoked.  Performed at [facility], [address], [place], [place] [address]   Urine culture     Status: Abnormal   Collection Time: 11/16/20  2:40 PM   Specimen: Urine, Clean Catch  Result Value Ref Range Status   Specimen Description   Final    URINE, CLEAN CATCH Performed at [facility], [address] [place], [place] [address]    Special  Requests   Final    NONE Performed at [facility], [address], [place], [place] [address]    Culture >=100,000 COLONIES/mL ESCHERICHIA COLI (A)  Final   Report Status 11/19/2020 FINAL  Final   Organism ID, Bacteria ESCHERICHIA COLI (A)  Final      Susceptibility   Escherichia coli - MIC*    AMPICILLIN >=32 RESISTANT Resistant     CEFAZOLIN <=4 SENSITIVE Sensitive     CEFEPIME <=0.12 SENSITIVE Sensitive     CEFTRIAXONE <=0.25 SENSITIVE Sensitive     CIPROFLOXACIN >=4 RESISTANT Resistant     GENTAMICIN <=1 SENSITIVE Sensitive     IMIPENEM <=0.25 SENSITIVE Sensitive     NITROFURANTOIN <=16 SENSITIVE Sensitive     TRIMETH/SULFA >=320 RESISTANT Resistant     AMPICILLIN/SULBACTAM 16 INTERMEDIATE Intermediate     PIP/TAZO <=4 SENSITIVE Sensitive     * >=100,000 COLONIES/mL ESCHERICHIA COLI  Resp Panel by RT-PCR (Flu A&B, Covid) Nasopharyngeal Swab     Status: None   Collection Time: 11/26/20  9:21 AM   Specimen: Nasopharyngeal Swab; Nasopharyngeal(NP) swabs in vial transport medium  Result Value Ref Range Status   SARS Coronavirus 2 by RT PCR  NEGATIVE NEGATIVE Final    Comment: (NOTE) SARS-CoV-2 target nucleic acids are NOT DETECTED.  The SARS-CoV-2 RNA is generally detectable in upper respiratory specimens during the acute phase of infection. The lowest concentration of SARS-CoV-2 viral copies this assay can detect is 138 copies/mL. A negative result does not preclude SARS-Cov-2 infection and should not be used as the sole basis for treatment or other patient management decisions. A negative result may occur with  improper specimen collection/handling, submission of specimen other than nasopharyngeal swab, presence of viral mutation(s) within the areas targeted by this assay, and inadequate number of viral copies(<138 copies/mL). A negative result must be combined with clinical observations, patient history, and epidemiological information. The expected  result is Negative.  Fact Sheet for Patients:  BloggerCourse.com  Fact Sheet for Healthcare Providers:  SeriousBroker.it  This test is no t yet approved or cleared by the [place] FDA and  has been authorized for detection and/or diagnosis of SARS-CoV-2 by FDA under an Emergency Use Authorization (EUA). This EUA will remain  in effect (meaning this test can be used) for the duration of the COVID-19 declaration under Section 564(b)(1) of the Act, 21 U.S.C.section 360bbb-3(b)(1), unless the authorization is terminated  or revoked sooner.       Influenza A by PCR NEGATIVE NEGATIVE Final   Influenza B by PCR NEGATIVE NEGATIVE Final    Comment: (NOTE) The Xpert Xpress SARS-CoV-2/FLU/RSV plus assay is intended as an aid in the diagnosis of influenza from Nasopharyngeal swab specimens and should not be used as a sole basis for treatment. Nasal washings and aspirates are unacceptable for Xpert Xpress SARS-CoV-2/FLU/RSV testing.  Fact Sheet for Patients: BloggerCourse.com  Fact Sheet for Healthcare Providers: SeriousBroker.it  This test is not yet approved or cleared by the [place] FDA and has been authorized for detection and/or diagnosis of SARS-CoV-2 by FDA under an Emergency Use Authorization (EUA). This EUA will remain in effect (meaning this test can be used) for the duration of the COVID-19 declaration under Section 564(b)(1) of the Act, 21 U.S.C. section 360bbb-3(b)(1), unless the authorization is terminated or revoked.  Performed at [facility], [address], [place], [place] [address]     Labs: BNP (last 3 results) No results for input(s): BNP in the last 8760 hours. Basic Metabolic Panel: Recent Labs  Lab 11/22/20 0227 11/23/20 0359 11/24/20 0443 11/25/20 0125 11/26/20 0331  NA 137 139 135 138 136  K 4.5 4.5 4.5 4.5 4.7  CL 105 108 104 105  104  CO2 23 26 27 27 29   GLUCOSE 130* 120* 127* 166* 117*  BUN 29* 29* 31* 30* 29*  CREATININE 1.62* 1.50* 1.74* 1.43* 1.52*  CALCIUM 9.0 9.0 8.6* 9.0 8.8*  MG 2.1 2.3 2.3 2.3 2.2  PHOS 4.2 4.2 4.7* 4.0 4.8*   Liver Function Tests: Recent Labs  Lab 11/22/20 0227 11/23/20 0359 11/24/20 0443 11/25/20 0125 11/26/20 0331  AST 66* 72* 63* 52* 44*  ALT 75* 81* 76* 68* 57*  ALKPHOS 202* 198* 189* 176* 150*  BILITOT 0.3 0.7 0.8 0.6 0.7  PROT 5.4* 5.4* 5.4* 5.7* 5.3*  ALBUMIN 2.9* 2.7* 2.7* 2.9* 2.7*   No results for input(s): LIPASE, AMYLASE in the last 168 hours. No results for input(s): AMMONIA in the last 168 hours. CBC: Recent Labs  Lab 11/20/20 0915 11/21/20 0919 11/22/20 0227 11/23/20 0359  WBC 5.9 5.4 7.1 6.3  NEUTROABS 4.0 3.3 4.1 3.3  HGB 13.6 13.3 12.6 12.2  HCT  41.3 40.4 38.1 37.2  MCV 95.4 94.4 94.3 95.1  PLT 236 226 211 214   Cardiac Enzymes: No results for input(s): CKTOTAL, CKMB, CKMBINDEX, TROPONINI in the last 168 hours. BNP: Invalid input(s): POCBNP CBG: Recent Labs  Lab 11/25/20 1122 11/25/20 1627 11/25/20 2111 11/26/20 0642 11/26/20 1120  GLUCAP 169* 208* 170* 117* 162*   D-Dimer No results for input(s): DDIMER in the last 72 hours. Hgb A1c No results for input(s): HGBA1C in the last 72 hours. Lipid Profile No results for input(s): CHOL, HDL, LDLCALC, TRIG, CHOLHDL, LDLDIRECT in the last 72 hours. Thyroid function studies No results for input(s): TSH, T4TOTAL, T3FREE, THYROIDAB in the last 72 hours.  Invalid input(s): FREET3 Anemia work up No results for input(s): VITAMINB12, FOLATE, FERRITIN, TIBC, IRON, RETICCTPCT in the last 72 hours. Urinalysis    Component Value Date/Time   COLORURINE YELLOW 11/16/2020 1440   APPEARANCEUR CLOUDY (A) 11/16/2020 1440   LABSPEC 1.010 11/16/2020 1440   PHURINE 7.0 11/16/2020 1440   GLUCOSEU NEGATIVE 11/16/2020 1440   HGBUR SMALL (A) 11/16/2020 1440   BILIRUBINUR NEGATIVE 11/16/2020 1440    BILIRUBINUR Large 10/11/2020 1540   KETONESUR NEGATIVE 11/16/2020 1440   PROTEINUR 100 (A) 11/16/2020 1440   UROBILINOGEN negative (A) 10/11/2020 1540   UROBILINOGEN 0.2 05/09/2007 1427   NITRITE NEGATIVE 11/16/2020 1440   LEUKOCYTESUR LARGE (A) 11/16/2020 1440   Sepsis Labs Invalid input(s): PROCALCITONIN,  WBC,  LACTICIDVEN Microbiology Recent Results (from the past 240 hour(s))  Resp Panel by RT-PCR (Flu A&B, Covid) Nasopharyngeal Swab     Status: None   Collection Time: 11/16/20  1:54 PM   Specimen: Nasopharyngeal Swab; Nasopharyngeal(NP) swabs in vial transport medium  Result Value Ref Range Status   SARS Coronavirus 2 by RT PCR NEGATIVE NEGATIVE Final    Comment: (NOTE) SARS-CoV-2 target nucleic acids are NOT DETECTED.  The SARS-CoV-2 RNA is generally detectable in upper respiratory specimens during the acute phase of infection. The lowest concentration of SARS-CoV-2 viral copies this assay can detect is 138 copies/mL. A negative result does not preclude SARS-Cov-2 infection and should not be used as the sole basis for treatment or other patient management decisions. A negative result may occur with  improper specimen collection/handling, submission of specimen other than nasopharyngeal swab, presence of viral mutation(s) within the areas targeted by this assay, and inadequate number of viral copies(<138 copies/mL). A negative result must be combined with clinical observations, patient history, and epidemiological information. The expected result is Negative.  Fact Sheet for Patients:  BloggerCourse.com  Fact Sheet for Healthcare Providers:  SeriousBroker.it  This test is no t yet approved or cleared by the [place] FDA and  has been authorized for detection and/or diagnosis of SARS-CoV-2 by FDA under an Emergency Use Authorization (EUA). This EUA will remain  in effect (meaning this test can be used) for the  duration of the COVID-19 declaration under Section 564(b)(1) of the Act, 21 U.S.C.section 360bbb-3(b)(1), unless the authorization is terminated  or revoked sooner.       Influenza A by PCR NEGATIVE NEGATIVE Final   Influenza B by PCR NEGATIVE NEGATIVE Final    Comment: (NOTE) The Xpert Xpress SARS-CoV-2/FLU/RSV plus assay is intended as an aid in the diagnosis of influenza from Nasopharyngeal swab specimens and should not be used as a sole basis for treatment. Nasal washings and aspirates are unacceptable for Xpert Xpress SARS-CoV-2/FLU/RSV testing.  Fact Sheet for Patients: BloggerCourse.com  Fact Sheet for Healthcare Providers: SeriousBroker.it  This  test is not yet approved or cleared by the [place] and has been authorized for detection and/or diagnosis of SARS-CoV-2 by FDA under an Emergency Use Authorization (EUA). This EUA will remain in effect (meaning this test can be used) for the duration of the COVID-19 declaration under Section 564(b)(1) of the Act, 21 U.S.C. section 360bbb-3(b)(1), unless the authorization is terminated or revoked.  Performed at [facility], [address], [place], [place] [address]   Urine culture     Status: Abnormal   Collection Time: 11/16/20  2:40 PM   Specimen: Urine, Clean Catch  Result Value Ref Range Status   Specimen Description   Final    URINE, CLEAN CATCH Performed at [facility], [address], [place], [place] [address]    Special Requests   Final    NONE Performed at [facility], [address], [place], [place] [address]    Culture >=100,000 COLONIES/mL ESCHERICHIA COLI (A)  Final   Report Status 11/19/2020 FINAL  Final   Organism ID, Bacteria ESCHERICHIA COLI (A)  Final      Susceptibility   Escherichia coli - MIC*    AMPICILLIN >=32 RESISTANT Resistant     CEFAZOLIN <=4 SENSITIVE Sensitive      CEFEPIME <=0.12 SENSITIVE Sensitive     CEFTRIAXONE <=0.25 SENSITIVE Sensitive     CIPROFLOXACIN >=4 RESISTANT Resistant     GENTAMICIN <=1 SENSITIVE Sensitive     IMIPENEM <=0.25 SENSITIVE Sensitive     NITROFURANTOIN <=16 SENSITIVE Sensitive     TRIMETH/SULFA >=320 RESISTANT Resistant     AMPICILLIN/SULBACTAM 16 INTERMEDIATE Intermediate     PIP/TAZO <=4 SENSITIVE Sensitive     * >=100,000 COLONIES/mL ESCHERICHIA COLI  Resp Panel by RT-PCR (Flu A&B, Covid) Nasopharyngeal Swab     Status: None   Collection Time: 11/26/20  9:21 AM   Specimen: Nasopharyngeal Swab; Nasopharyngeal(NP) swabs in vial transport medium  Result Value Ref Range Status   SARS Coronavirus 2 by RT PCR NEGATIVE NEGATIVE Final    Comment: (NOTE) SARS-CoV-2 target nucleic acids are NOT DETECTED.  The SARS-CoV-2 RNA is generally detectable in upper respiratory specimens during the acute phase of infection. The lowest concentration of SARS-CoV-2 viral copies this assay can detect is 138 copies/mL. A negative result does not preclude SARS-Cov-2 infection and should not be used as the sole basis for treatment or other patient management decisions. A negative result may occur with  improper specimen collection/handling, submission of specimen other than nasopharyngeal swab, presence of viral mutation(s) within the areas targeted by this assay, and inadequate number of viral copies(<138 copies/mL). A negative result must be combined with clinical observations, patient history, and epidemiological information. The expected result is Negative.  Fact Sheet for Patients:  BloggerCourse.com  Fact Sheet for Healthcare Providers:  SeriousBroker.it  This test is no t yet approved or cleared by the [place] FDA and  has been authorized for detection and/or diagnosis of SARS-CoV-2 by FDA under an Emergency Use Authorization (EUA). This EUA will remain  in effect (meaning  this test can be used) for the duration of the COVID-19 declaration under Section 564(b)(1) of the Act, 21 U.S.C.section 360bbb-3(b)(1), unless the authorization is terminated  or revoked sooner.       Influenza A by PCR NEGATIVE NEGATIVE Final   Influenza B by PCR NEGATIVE NEGATIVE Final    Comment: (NOTE) The Xpert Xpress SARS-CoV-2/FLU/RSV plus assay is intended as an aid in the  diagnosis of influenza from Nasopharyngeal swab specimens and should not be used as a sole basis for treatment. Nasal washings and aspirates are unacceptable for Xpert Xpress SARS-CoV-2/FLU/RSV testing.  Fact Sheet for Patients: BloggerCourse.comhttps://www.fda.gov/media/152166/download  Fact Sheet for Healthcare Providers: SeriousBroker.ithttps://www.fda.gov/media/152162/download  This test is not yet approved or cleared by the [place] FDA and has been authorized for detection and/or diagnosis of SARS-CoV-2 by FDA under an Emergency Use Authorization (EUA). This EUA will remain in effect (meaning this test can be used) for the duration of the COVID-19 declaration under Section 564(b)(1) of the Act, 21 U.S.C. section 360bbb-3(b)(1), unless the authorization is terminated or revoked.  Performed at [facility], [address] [place], [place] [id number]    Time coordinating discharge: 35 minutes  SIGNED:  Merlene Laughtermair Latif Aster Eckrich, DO [facility] 11/26/2020, 12:17 PM Pager is on AMION  If 7PM-7AM, please contact night-coverage [email or web address]

## 2020-11-26 NOTE — TOC Transition Note (Signed)
Transition of Care [facility]) - CM/SW Discharge Note   Patient Details  Name: Heather [name] MRN: [id number] Date of Birth: Oct 04, 1940  [facility]) CM/SW Contact:  Baldemar Lenis, LCSW Phone Number: 11/26/2020, 1:16 PM   Clinical Narrative:   Nurse to call report to [phone].    Final next level of care: Skilled Nursing Facility Barriers to Discharge: Barriers Resolved   Patient Goals and CMS Choice Patient states their goals for this hospitalization and ongoing recovery are:: to get better CMS Medicare.gov Compare Post Acute Care list provided to:: Patient Choice offered to / list presented to : Patient, Adult Children  Discharge Placement              Patient chooses bed at: [facility] Patient to be transferred to facility by: [facility] Name of family member notified: [name], Amy Patient and family notified of of transfer: 11/26/20  Discharge Plan and Services     Post Acute Care Choice: Skilled Nursing Facility                               Social Determinants of Health (SDOH) Interventions     Readmission Risk Interventions No flowsheet data found.

## 2020-11-26 NOTE — Plan of Care (Signed)
  Problem: Education: Goal: Knowledge of disease or condition will improve Outcome: Progressing Goal: Knowledge of secondary prevention will improve Outcome: Progressing Goal: Knowledge of patient specific risk factors addressed and post discharge goals established will improve Outcome: Progressing   Problem: Health Behavior/Discharge Planning: Goal: Ability to manage health-related needs will improve Outcome: Progressing   Problem: Self-Care: Goal: Ability to participate in self-care as condition permits will improve Outcome: Progressing   Problem: Ischemic Stroke/TIA Tissue Perfusion: Goal: Complications of ischemic stroke/TIA will be minimized Outcome: Progressing   

## 2020-11-26 NOTE — Plan of Care (Signed)
  Problem: Education: Goal: Knowledge of General Education information will improve Description: Including pain rating scale, medication(s)/side effects and non-pharmacologic comfort measures Outcome: Progressing   Problem: Clinical Measurements: Goal: Ability to maintain clinical measurements within normal limits will improve Outcome: Progressing Goal: Will remain free from infection Outcome: Progressing Goal: Diagnostic test results will improve Outcome: Progressing Goal: Respiratory complications will improve Outcome: Progressing Goal: Cardiovascular complication will be avoided Outcome: Progressing   Problem: Ischemic Stroke/TIA Tissue Perfusion: Goal: Complications of ischemic stroke/TIA will be minimized Outcome: Progressing   

## 2020-11-27 NOTE — Progress Notes (Signed)
Patient IV removed, transported by Sharin Mons, to [facility], report called to Nurse at facility.

## 2020-12-16 ENCOUNTER — Ambulatory Visit: Payer: Medicare Other | Admitting: Family

## 2020-12-18 ENCOUNTER — Encounter: Payer: Self-pay | Admitting: Family

## 2020-12-19 ENCOUNTER — Encounter: Payer: Medicare Other | Admitting: Family

## 2020-12-19 NOTE — Progress Notes (Signed)
Provider: Richarda Blade FNP-C   Heather [name], Heather Citrin, NP  Patient Care Team: Daija Routson, Heather Citrin, NP as PCP - General (Family Medicine) System, Provider Not In [place] Registered Nurse [facility] and Palliative Medicine)  Extended Emergency Contact Information Primary Emergency Contact: Heather [name] [place] of [place] Mobile Phone: [phone] Relation: Daughter Secondary Emergency Contact: Heather [name],Heather [name] Mobile Phone: [phone] Relation: Daughter  Code Status: DNR Goals of care: Advanced Directive information Advanced Directives 12/18/2020  Does Patient Have a Medical Advance Directive? Yes  Type of Advance Directive Out of facility DNR (pink MOST or yellow form)  Does patient want to make changes to medical advance directive? No - Patient declined  Would patient like information on creating a medical advance directive? -  Pre-existing out of facility DNR order (yellow form or pink MOST form) -     Chief Complaint  Patient presents with  . Medical Management of Chronic Issues    6 month follow up.  Marland Kitchen Health Maintenance    Discuss the need for eye exam, foot exam, and dexa scan.  . Immunizations    Discuss the need for shingrix vaccine, tetanus vaccine, covid booster, and influenza vaccine.    HPI:  Pt is a 80 y.o. female seen today for medical management of chronic diseases.     Past Medical History:  Diagnosis Date  . Diabetes mellitus without complication (HCC)   . High cholesterol   . History of stroke 2021  . Hypertension    History reviewed. No pertinent surgical history.  Allergies  Allergen Reactions  . Iohexol      Desc: Shaking and sore throat, Onset Date: [id number]   . Iohexol Other (See Comments)    Reaction not recalled    Allergies as of 12/19/2020       Reactions   Iohexol     Desc: Shaking and sore throat, Onset Date: [id number]   Iohexol Other (See Comments)   Reaction not recalled        Medication List         Accurate as of December 19, 2020  3:00 PM. If you have any questions, ask your nurse or doctor.          acetaminophen 500 MG tablet Commonly known as: TYLENOL Take 500 mg by mouth every 6 (six) hours as needed for mild pain, fever or headache.   amLODipine 10 MG tablet Commonly known as: NORVASC Take 1 tablet (10 mg total) by mouth daily.   aspirin 325 MG EC tablet Take 1 tablet (325 mg total) by mouth daily.   atorvastatin 40 MG tablet Commonly known as: LIPITOR Take 40 mg by mouth daily.   clopidogrel 75 MG tablet Commonly known as: PLAVIX Take 1 tablet (75 mg total) by mouth daily.   fluticasone 50 MCG/ACT nasal spray Commonly known as: FLONASE Place 1-2 sprays into both nostrils daily as needed for allergies or rhinitis.   losartan 100 MG tablet Commonly known as: COZAAR Take 1 tablet (100 mg total) by mouth daily.   melatonin 3 MG Tabs tablet Take 1 tablet (3 mg total) by mouth at bedtime.   metoprolol tartrate 25 MG tablet Commonly known as: LOPRESSOR Take 1 tablet by mouth twice daily   senna-docusate 8.6-50 MG tablet Commonly known as: Senokot-S Take 1 tablet by mouth at bedtime as needed for moderate constipation.   sertraline 25 MG tablet Commonly known as: ZOLOFT TAKE 1 TABLET BY MOUTH AT BEDTIME   traZODone  50 MG tablet Commonly known as: DESYREL Take 0.5 tablets (25 mg total) by mouth at bedtime.   VITAMIN C PO Take 1 tablet by mouth daily.   ZINC PO Take 1 tablet by mouth daily.        Review of Systems  Immunization History  Administered Date(s) Administered  . Influenza, High Dose Seasonal PF 02/05/2020  . Influenza-Unspecified 05/12/2019  . PFIZER(Purple Top)SARS-COV-2 Vaccination 07/25/2019, 08/17/2019  . Pneumococcal Polysaccharide-23 05/01/2020   Pertinent  Health Maintenance Due  Topic Date Due  . FOOT EXAM  Never done  . OPHTHALMOLOGY EXAM  Never done  . DEXA SCAN  Never done  . INFLUENZA VACCINE  12/09/2020  .  PNA vac Low Risk Adult (2 of 2 - PCV13) 05/01/2021  . HEMOGLOBIN A1C  05/20/2021   Fall Risk  12/18/2020 10/11/2020 08/21/2020 06/18/2020 05/23/2020  Falls in the past year? 0 0 0 1 0  Number falls in past yr: 0 0 0 1 0  Injury with Fall? 0 0 0 0 0  Risk for fall due to : No Fall Risks - - - -  Follow up Falls evaluation completed - - - -   Functional Status Survey:    There were no vitals filed for this visit. There is no height or weight on file to calculate BMI. Physical Exam  Labs reviewed: Recent Labs    11/24/20 0443 11/25/20 0125 11/26/20 0331  NA 135 138 136  K 4.5 4.5 4.7  CL 104 105 104  CO2 27 27 29   GLUCOSE 127* 166* 117*  BUN 31* 30* 29*  CREATININE 1.74* 1.43* 1.52*  CALCIUM 8.6* 9.0 8.8*  MG 2.3 2.3 2.2  PHOS 4.7* 4.0 4.8*   Recent Labs    11/24/20 0443 11/25/20 0125 11/26/20 0331  AST 63* 52* 44*  ALT 76* 68* 57*  ALKPHOS 189* 176* 150*  BILITOT 0.8 0.6 0.7  PROT 5.4* 5.7* 5.3*  ALBUMIN 2.7* 2.9* 2.7*   Recent Labs    11/21/20 0919 11/22/20 0227 11/23/20 0359  WBC 5.4 7.1 6.3  NEUTROABS 3.3 4.1 3.3  HGB 13.3 12.6 12.2  HCT 40.4 38.1 37.2  MCV 94.4 94.3 95.1  PLT 226 211 214   Lab Results  Component Value Date   TSH 2.553 11/17/2020   Lab Results  Component Value Date   HGBA1C 6.6 (H) 11/17/2020   Lab Results  Component Value Date   CHOL 227 (H) 11/17/2020   HDL 36 (L) 11/17/2020   LDLCALC 167 (H) 11/17/2020   TRIG 120 11/17/2020   CHOLHDL 6.3 11/17/2020    Significant Diagnostic Results in last 30 days:  No results found.  Assessment/Plan There are no diagnoses linked to this encounter.   Family/ staff Communication: Reviewed plan of care with patient  Labs/tests ordered: None   Next Appointment :   01/18/2021, NP    This encounter was created in error - please disregard.

## 2020-12-26 ENCOUNTER — Encounter: Payer: Self-pay | Admitting: Adult Health

## 2020-12-26 ENCOUNTER — Ambulatory Visit (INDEPENDENT_AMBULATORY_CARE_PROVIDER_SITE_OTHER): Payer: Medicare HMO | Admitting: Adult Health

## 2020-12-26 VITALS — BP 148/68 | HR 61 | Ht 60.0 in | Wt 146.0 lb

## 2020-12-26 DIAGNOSIS — E785 Hyperlipidemia, unspecified: Secondary | ICD-10-CM

## 2020-12-26 DIAGNOSIS — I10 Essential (primary) hypertension: Secondary | ICD-10-CM

## 2020-12-26 DIAGNOSIS — E119 Type 2 diabetes mellitus without complications: Secondary | ICD-10-CM | POA: Diagnosis not present

## 2020-12-26 DIAGNOSIS — I639 Cerebral infarction, unspecified: Secondary | ICD-10-CM

## 2020-12-26 MED ORDER — ROSUVASTATIN CALCIUM 40 MG PO TABS: 40.0000 mg | ORAL_TABLET | Freq: Every day | ORAL | 5 refills | Status: AC

## 2020-12-26 NOTE — Patient Instructions (Addendum)
Recommend start Crestor 40mg  daily for further stroke prevention and slow progression of intracranial stenosis - request facility routinely monitor cholesterol levels  Continue aspirin and Plavix for additional 6 weeks then just Plavix alone (on aspirin prior to recent stroke which could be considered aspirin failure therefore would recommend ongoing use of Plavix after completion of aspirin)  Recommend completing heart monitor for rule out atrial fibrillation due to prior and recent stroke present in different vascular territories bilaterally - monitor should be sent to facility (no cardiology office visit needed)  Continue to follow up with PCP regarding cholesterol, blood pressure and diabetes management  Maintain strict control of hypertension with blood pressure goal below 130/90, diabetes with hemoglobin A1c goal below 7% and cholesterol with LDL cholesterol (bad cholesterol) goal below 70 mg/dL.   Request SNF monitor swelling in your leg - important to routinely keep legs elevated, use of compression stocking and limited sodium intake      Followup in the future with me in 6 months or call earlier if needed       Thank you for coming to see at [facility]. I hope we have been able to provide you high quality care today.  You may receive a patient satisfaction survey over the next few weeks. We would appreciate your feedback and comments so that we may continue to improve ourselves and the health of our patients.

## 2020-12-26 NOTE — Progress Notes (Signed)
[facility] [address] [place]. [place] [address] [phone]       STROKE FOLLOW UP NOTE  [name] Date of Birth:  09-21-1940 Medical Record Number:  [id number]   Reason for Referral: stroke follow up    SUBJECTIVE:   CHIEF COMPLAINT:  Chief Complaint  Patient presents with   Follow-up    RM 3 with daughter amy Pt is well, had another stroke but no symptoms, been placed in assisted living. Overall well.       HPI:   Today, 12/26/2020, Heather [name] returns for scheduled 12-month stroke follow-up accompanied by her daughter but unfortunately diagnosed with acute infarct cortical right frontal lobe and subacute infarct left corpus callosum/occipital lobe on 11/16/2020 at [facility] ED. Per report, patient not acting right with worsening confusion over the 3 weeks prior following diagnosis of COVID-19 in June.  MRA head/neck showed advanced intracranial atherosclerosis with multifocal high-grade narrowing progressed from 2021 and chronically diminished flow L VA due to not resolved proximal stenosis.  EF 60 to 65%.  EEG diffuse mild encephalopathy nonspecific without seizure or epileptiform discharges.  Eval by neurology recommended DAPT for 3 months then aspirin alone.  LDL 167 -statin on hold due to elevated LFTs and was resumed at atorvastatin 40 mg daily at discharge.  A1c 6.6.  HTN: Resumed metoprolol and losartan and added amlodipine , stopped hydralazine.  Hospital course complicated by E. coli UTI, elevated LFTs, hypokalemia, CKD stage IIIb with hyperphosphatemia and acute metabolic encephalopathy.  Therapy evaluations recommended CIR however family elected SNF for long-term placement   Since discharge, overall doing well.  She continues to reside at [facility] [place]. Daughter was in the process of looking for placement prior to recent stroke due to increased falls and worsening cognition. She will likely continue to reside at SNF long  term.  She has since completed therapies and continues to use Rollator walker at all times without any recent falls.  Cognition relatively stable with wax/waning with occasional confusion. Per [facility] list, remains on aspirin 325 mg daily and Plavix 75 mg daily -denies side effects.  Atorvastatin since discontinued due to ?leg cramping. CMP completed 8/1 at SNF per report - unable to view results via epic.  Blood pressure today 148/68 - VS monitored weekly which has been stable. BG monitored routinely - stable per pt and daughter report.  No further concerns at this time.    Pertinent imaging  -CT head without contrast with no acute intracranial findings but notable for advanced small vessel white matter disease and encephalomalacia of the right caudate/corona radiata consistent with prior CVA.   -MR brain with acute infarct just a cortical right frontal lobe with subacute infarct left corpus callosum/occipital lobe.   -MRA head/neck with advanced intracranial atherosclerosis with multifocal high-grade narrowing which is progressed from 2021 and chronically diminished flow left vertebral artery due to none resolved proximal stenosis.   -Hemoglobin A1c 6.6, LDL 167.   -TTE with LVEF 60-75% grade 1 diastolic dysfunction.   -EEG with diffuse mild encephalopathy nonspecific without seizure or epileptiform discharges.    History provided for reference purposes only Update 06/26/2020 JM: Heather [name] returns for stroke follow-up accompanied by her daughter, [name], after prior visit 4 months ago.  She has been stable since prior visit without new stroke/TIA symptoms and reports residual cognitive impairment, LLE weakness and gait impairment.  She continues to live with her daughter but is able to manage majority of  ADLs independently.  Reports compliance with aspirin 81 mg daily without bleeding or bruising.  Previously on atorvastatin 80 mg daily but recent lab work showed elevated LFTs therefore dosage  decreased to 40 mg daily.  Recent lipid panel LDL 101 (down from 125).  Recent A1c 6.3 (down from 6.6).  Blood pressure today elevated at 167/73.  Reports elevated readings at home over the past week.  No further concerns at this time.  Initial visit 02/21/2020 JM: Heather [name] is being seen for hospital follow-up accompanied by her daughter.  Per patient, she does not have any residual deficits or symptoms.  Reports working with home health therapy and when asked what they are working with her on she states " nothing really".  She is ambulating with a rolling walker which she was not using previously.  She is currently residing with her older daughter not present at today's visit.  She was previously living in her own apartment independently. Per daughter, she continues to have cognitive impairment, slurred speech, upper extremity shaking, weakness, hand numbness and gait impairment.  She is able to dress independently but per daughter, has difficulty getting her to bathe routinely.  Denies new or worsening stroke/TIA symptoms.  She has completed 3 weeks DAPT and remains on aspirin alone without bleeding or bruising.  Recently established care with PCP who discontinued Crestor and initiated atorvastatin 80 mg daily.  She appears to be tolerating well without side effects.  Blood pressure today 181/74 and on recheck 170/78.  She reports monitoring at home and similar to today's reading.  They have not been called to complete cardiac event monitor at this time.  No further concerns.  Heather [name] is a 80 y.o. female w/pmh of HTN, HLD, diabetes, medical non compliance who presents with confusion and gait difficulty. Her gait difficulty started on 01/12/20 and confusion started on 01/16/20. She was taken to an OSH on Tuesday where she was diagnosed with a UTI + CTH was done that revealed and old stroke in her brain. She was sent home on oral abx and [facility]20 due to continued confusion and  gait difficulty noted to be swerving to the left side while walking.  Personally reviewed recent hospitalization pertinent progress notes, lab work and imaging with summary provided.  Evaluated by Dr. Roda ShuttersXu with stroke work-up revealing acute right basal ganglia stroke and small acute stroke of the left internal capsule and lentiform nucleus likely secondary to small vessel disease given uncontrolled risk factors of HTN and HLD and controlled DM.  However, given bilateral nature of her stroke, it was recommended to obtain cardiac event monitor to rule out atrial fibrillation.  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  EF 65 to 70%.  Per note, patient apparently self discontinued all of her medications as she did not like her PCP which she is on the past.  She was started on DAPT for 21 days and then aspirin alone as well as Crestor for LDL 239 with prior history of statin myalgia.  Elevated SBP during admission with known HTN with long-term BP goal normotensive range.  Controlled DM with A1c 6.4 and restarted Metformin at discharge as this was previously thought discontinued.  No prior stroke history.  Residual deficits left facial droop, decreased left hand dexterity, slight weakness of left proximal lower extremity and cognitive impairment.  She was evaluated by therapy and transferred to rehab center at [facility]  for functional decline on 9/13.    ROS:   14 system review of systems performed and negative with exception of those listed in HPI  PMH:  Past Medical History:  Diagnosis Date   Diabetes mellitus without complication (HCC)    High cholesterol    History of stroke 2021   Hypertension     PSH: History reviewed. No pertinent surgical history.  Social History:  Social History   Socioeconomic History   Marital status: Single    Spouse name: Not on file   Number of children: Not on file   Years of education: Not on file   Highest  education level: Not on file  Occupational History   Not on file  Tobacco Use   Smoking status: Never   Smokeless tobacco: Never  Vaping Use   Vaping Use: Never used  Substance and Sexual Activity   Alcohol use: No   Drug use: No   Sexual activity: Never  Other Topics Concern   Not on file  Social History Narrative   Tobacco use, amount per day now: None.   Past tobacco use, amount per day: None.   How many years did you use tobacco: N/A   Alcohol use (drinks per week): None.   Diet:   Do you drink/eat things with caffeine: Coffee.   Marital status: Widowed                                 What year were you married? 1961   Do you live in a house, apartment, assisted living, condo, trailer, etc.? House.   Is it one or more stories? 2   How many persons live in your home? 2   Do you have pets in your home?( please list) Dogs ( 2 )   Highest Level of education completed? 11th Grade.   Current or past profession: [employer].   Do you exercise? No.                                 Type and how often?   Do you have a living will? No   Do you have a DNR form?   No                                If not, do you want to discuss one?   Do you have signed POA/HPOA forms?  No                    If so, please bring to you appointment      Do you have any difficulty bathing or dressing yourself? Yes   Do you have any difficulty preparing food or eating? Yes   Do you have any difficulty managing your medications? Yes   Do you have any difficulty managing your finances? Yes   Do you have any difficulty affording your medications? Yes   Social Determinants of Health   Financial Resource Strain: Not on file  Food Insecurity: No Food Insecurity   Worried About Programme researcher, broadcasting/film/video in the Last Year: Never true   Ran Out of Food in the Last Year: Never true  Transportation Needs: No Transportation Needs   Lack of Transportation (Medical): No   Lack of Transportation (Non-Medical): No  Physical Activity: Not on file  Stress: Not on file  Social Connections: Not on file  Intimate Partner Violence: Not on file    Family History:  Family History  Problem Relation Age of Onset   Stroke Mother    Lung cancer Father    Cancer Daughter    Hypertension Daughter    High Cholesterol Daughter     Medications:   Current Outpatient Medications on File Prior to Visit  Medication Sig Dispense Refill   acetaminophen (TYLENOL) 500 MG tablet Take 500 mg by mouth every 6 (six) hours as needed for mild pain, fever or headache.     amLODipine (NORVASC) 10 MG tablet Take 1 tablet (10 mg total) by mouth daily. 30 tablet    aspirin EC 325 MG EC tablet Take 1 tablet (325 mg total) by mouth daily. 30 tablet 0   clopidogrel (PLAVIX) 75 MG tablet Take 1 tablet (75 mg total) by mouth daily. 30 tablet 2   fluticasone (FLONASE) 50 MCG/ACT nasal spray Place 1-2 sprays into both nostrils daily as needed for allergies or rhinitis.     losartan (COZAAR) 100 MG tablet Take 1 tablet (100 mg total) by mouth daily. 30 tablet 1   melatonin 3 MG TABS tablet Take 1 tablet (3 mg total) by mouth at bedtime.  0   metoprolol tartrate (LOPRESSOR) 25 MG tablet Take 1 tablet by mouth twice daily 180 tablet 0   Multiple Vitamins-Minerals (ZINC PO) Take 1 tablet by mouth daily.     senna-docusate (SENOKOT-S) 8.6-50 MG tablet Take 1 tablet by mouth at bedtime as needed for moderate constipation. 30 tablet 0   sertraline (ZOLOFT) 25 MG tablet TAKE 1 TABLET BY MOUTH AT BEDTIME 90 tablet 0   traZODone (DESYREL) 50 MG tablet Take 0.5 tablets (25 mg total) by mouth at bedtime. 30 tablet 0   atorvastatin (LIPITOR) 40 MG tablet Take 40 mg by mouth daily.     No current facility-administered medications on file prior to visit.    Allergies:   Allergies  Allergen Reactions   Iohexol      Desc: Shaking and sore throat, Onset Date: [id number]    Iohexol Other (See Comments)    Reaction not recalled       OBJECTIVE:  Physical Exam  Vitals:   12/26/20 1331  BP: (!) 166/65  Pulse: 61  Weight: 146 lb (66.2 kg)  Height: 5' (1.524 m)    Body mass index is 28.51 kg/m. No results found.  General: well developed, well nourished, very pleasant elderly Caucasian female, seated, in no evident distress Head: head normocephalic and atraumatic.   Neck: supple with no carotid or supraclavicular bruits Cardiovascular: regular rate and rhythm, no murmurs Musculoskeletal: no deformity Skin:  no rash/petichiae; BLE edema  Vascular:  Normal pulses all extremities   Neurologic Exam Mental Status: Awake and fully alert.  Fluent speech and language. Oriented to place and time. Recent memory impaired and remote memory intact. Attention span, concentration and fund of knowledge impaired. Mood and affect appropriate No flowsheet data found.  Cranial Nerves: Pupils equal, briskly reactive to light. Extraocular movements full without nystagmus. Visual fields full to confrontation. Hearing intact. Facial sensation intact. Face, tongue, palate moves normally and symmetrically.  Motor: Normal bulk and tone. Normal strength in all tested extremity muscles  Sensory.: intact to touch , pinprick , position and vibratory sensation  Coordination: Rapid alternating movements normal in all extremities. Finger-to-nose and heel-to-shin performed accurately bilaterally. Gait and  Station: Arises from chair without difficulty. Stance is hunched. Gait demonstrates  normal stride length with mild imbalance and use of Rollator walker Reflexes: 1+ and symmetric. Toes downgoing.        ASSESSMENT: Heather [name] is a 80 y.o. year old female with hx of R BG and L IC stroke 01/17/2020 likely secondary to small vessel disease with uncontrolled risk factors and recent R frontal and subacute L CC and occipital stroke 11/16/2020 as well as evidence of progression of intracranial arthrosclerosis with multifocal high-grade  narrowing progressed from prior imaging in 2021.  Vascular risk factors include HTN, HLD, DM, and L VA occlusion chronic     PLAN:  Recent R frontal and L CC and occipital lobe strokes:  Hx of R BG and L IC strokes :  Residual deficit: Cognitive impairment and gait impairment.  Stable since recent d/c currently long-term resident of [facility].   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 fib Continue aspirin 325 mg daily and clopidogrel 75 mg daily  for additional 6 weeks then Plavix alone as on aspirin prior to recent stroke Discussed secondary stroke prevention measures and importance of close PCP follow up for aggressive stroke risk factor management  HTN: BP goal <130/90.  Routine monitoring management her husband HLD: LDL goal <70. Recent LDL 167.  Atorvastatin recently DC'd due to myalgias per daughter.  Chronic history of elevated LFTs currently at baseline and due to significantly elevated LDLs and worsening intracranial stenosis, recommend initiating Crestor 40 mg daily request SNF routinely monitor lipid panel and LFTs DMII: A1c goal<7.0. Recent A1c 6.6 currently diet controlled. Stopped metformin due to elevated kidney function Dementia: likey vascular slightly worsened after recent stroke. MMSE today 20/30. Declines interest in Namenda or Aricept.  No behavioral concerns.  Continue to monitor    Follow up in 6 months or call earlier if needed   CC:  [facility] provider: Dr. Artelia Laroche, Donalee Citrin, NP    I spent 46 minutes of face-to-face and non-face-to-face time with patient and daughter.  This included previsit chart review including review of recent hospitalization, lab review, study review, order entry, electronic health record documentation, patient and daughter education regarding recent stroke and prior stroke including etiology, residual  deficits, secondary stroke prevention measures and importance of managing stroke risk factors, completion and review of MMSE and answered all other questions to patient and daughters satisfaction   Ihor Austin, AGNP-BC  [facility] [address] [place], [place] [address]  Phone [phone] Fax [phone] Note: This document was prepared with digital dictation and possible smart phrase technology. Any transcriptional errors that result from this process are unintentional.

## 2020-12-31 NOTE — Progress Notes (Signed)
I agree with the above plan 

## 2021-01-07 ENCOUNTER — Telehealth: Payer: Self-pay | Admitting: Family

## 2021-01-07 NOTE — Telephone Encounter (Signed)
I left the patient a vm to call us back and schedule an AWV & 6 Month follow up from last visit

## 2021-02-19 ENCOUNTER — Ambulatory Visit: Payer: 59 | Admitting: Internal Medicine

## 2021-02-19 NOTE — Progress Notes (Deleted)
New Outpatient Visit Date: 02/19/2021  Referring Provider: Ihor Austin, NP [address] [place],  [place] [address]  Chief Complaint: ***  HPI:  Heather [name] is a 80 y.o. female who is being seen today for the evaluation of recurrent strokes at the request of Ms. McCue. She has a history of recurrent strokes (most recently 11/2020 following diagnosis of COVID-19 in 10/2020), hypertension, hyperlipidemia, type 2 diabetes mellitus, chronic stage III. ***  RoPE score = 2 (0% chance that stroke is due to PFO). --------------------------------------------------------------------------------------------------  Cardiovascular History & Procedures: Cardiovascular Problems: Recurrent stroke  Risk Factors: Strokes, hypertension, hyperlipidemia, type 2 diabetes mellitus, age greater than 42  Cath/PCI: None  CV Surgery: None  EP Procedures and Devices: None  Non-Invasive Evaluation(s): TTE (11/17/2020): Normal LV size and wall thickness.  LVEF 70-75% with grade 1 diastolic dysfunction.  Normal RV size and function.  Normal PA pressure.  Normal biatrial size.  Trivial mitral regurgitation.  Mild mitral regurgitation. TTE (01/18/2020): Normal LV size and wall thickness.  LVEF 65-70%.  Indeterminate diastolic function.  Normal RV size and function.  Normal PA pressure.  Normal biatrial size.  Trivial mitral and tricuspid regurgitation.  Recent CV Pertinent Labs: Lab Results  Component Value Date   CHOL 227 (H) 11/17/2020   HDL 36 (L) 11/17/2020   LDLCALC 167 (H) 11/17/2020   LDLCALC 101 (H) 06/18/2020   TRIG 120 11/17/2020   CHOLHDL 6.3 11/17/2020   INR 0.9 11/16/2020   K 4.7 11/26/2020   MG 2.2 11/26/2020   BUN 29 (H) 11/26/2020   CREATININE 1.52 (H) 11/26/2020   CREATININE 1.23 (H) 06/18/2020    --------------------------------------------------------------------------------------------------  Past Medical History:  Diagnosis Date   Diabetes mellitus without complication  (HCC)    High cholesterol    History of stroke 2021   Hypertension     No past surgical history on file.  No outpatient medications have been marked as taking for the 02/19/21 encounter (Appointment) with Odilon Cass, Cristal Deer, MD.    Allergies: Iohexol and Iohexol  Social History   Tobacco Use   Smoking status: Never   Smokeless tobacco: Never  Vaping Use   Vaping Use: Never used  Substance Use Topics   Alcohol use: No   Drug use: No    Family History  Problem Relation Age of Onset   Stroke Mother    Lung cancer Father    Cancer Daughter    Hypertension Daughter    High Cholesterol Daughter     Review of Systems: A 12-system review of systems was performed and was negative except as noted in the HPI.  --------------------------------------------------------------------------------------------------  Physical Exam: There were no vitals taken for this visit.  General:  *** HEENT: No conjunctival pallor or scleral icterus. Facemask in place. Neck: Supple without lymphadenopathy, thyromegaly, JVD, or HJR. No carotid bruit. Lungs: Normal work of breathing. Clear to auscultation bilaterally without wheezes or crackles. Heart: Regular rate and rhythm without murmurs, rubs, or gallops. Non-displaced PMI. Abd: Bowel sounds present. Soft, NT/ND without hepatosplenomegaly Ext: No lower extremity edema. Radial, PT, and DP pulses are 2+ bilaterally Skin: Warm and dry without rash. Neuro: CNIII-XII intact. Strength and fine-touch sensation intact in upper and lower extremities bilaterally. Psych: Normal mood and affect.  EKG:  ***  Lab Results  Component Value Date   WBC 6.3 11/23/2020   HGB 12.2 11/23/2020   HCT 37.2 11/23/2020   MCV 95.1 11/23/2020   PLT 214 11/23/2020    Lab Results  Component Value Date   NA 136 11/26/2020   K 4.7 11/26/2020   CL 104 11/26/2020   CO2 29 11/26/2020   BUN 29 (H) 11/26/2020   CREATININE 1.52 (H) 11/26/2020   GLUCOSE 117 (H)  11/26/2020   ALT 57 (H) 11/26/2020    Lab Results  Component Value Date   CHOL 227 (H) 11/17/2020   HDL 36 (L) 11/17/2020   LDLCALC 167 (H) 11/17/2020   TRIG 120 11/17/2020   CHOLHDL 6.3 11/17/2020     --------------------------------------------------------------------------------------------------  ASSESSMENT AND PLAN: Yvonne Kendall, MD 02/19/2021 8:18 AM

## 2021-02-21 ENCOUNTER — Encounter: Payer: Self-pay | Admitting: Internal Medicine

## 2021-03-20 ENCOUNTER — Telehealth: Payer: Self-pay

## 2021-03-20 NOTE — Telephone Encounter (Signed)
Called patient to discuss PREVAIL trial but patient's daughter answered and informed me that patient is now in a nursing home

## 2021-06-23 ENCOUNTER — Ambulatory Visit: Payer: Medicare HMO | Admitting: Adult Health

## 2021-06-23 NOTE — Progress Notes (Unsigned)
[facility] [address] [place]. [place] [address] [phone]       STROKE FOLLOW UP NOTE  Heather [name] Date of Birth:  02-Apr-1941 Medical Record Number:  [id number]   Reason for Referral: stroke follow up    SUBJECTIVE:   CHIEF COMPLAINT:  No chief complaint on file.    HPI:   Update 06/23/2021 JM: Returns for 36-month stroke follow-up accompanied by ***.  Continues to reside in SNF. Overall stable without new stroke/TIA symptoms. Cognition stable since prior visit - denies behaviors.  Compliant on Plavix and Crestor without side effects.  Blood pressure today ***.       History provided for reference purposes only Update 12/26/2020 JM: Heather [name] returns for scheduled 33-month stroke follow-up accompanied by her daughter but unfortunately diagnosed with acute infarct cortical right frontal lobe and subacute infarct left corpus callosum/occipital lobe on 11/16/2020 at [facility] ED. Per report, patient not acting right with worsening confusion over the 3 weeks prior following diagnosis of COVID-19 in June.  MRA head/neck showed advanced intracranial atherosclerosis with multifocal high-grade narrowing progressed from 2021 and chronically diminished flow L VA due to not resolved proximal stenosis.  EF 60 to 65%.  EEG diffuse mild encephalopathy nonspecific without seizure or epileptiform discharges.  Eval by neurology recommended DAPT for 3 months then aspirin alone.  LDL 167 -statin on hold due to elevated LFTs and was resumed at atorvastatin 40 mg daily at discharge.  A1c 6.6.  HTN: Resumed metoprolol and losartan and added amlodipine , stopped hydralazine.  Hospital course complicated by E. coli UTI, elevated LFTs, hypokalemia, CKD stage IIIb with hyperphosphatemia and acute metabolic encephalopathy.  Therapy evaluations recommended CIR however family elected SNF for long-term placement   Since discharge, overall doing well.  She continues to reside at [facility]. Daughter was in the process of looking for placement prior to recent stroke due to increased falls and worsening cognition. She will likely continue to reside at SNF long term.  She has since completed therapies and continues to use Rollator walker at all times without any recent falls.  Cognition relatively stable with wax/waning with occasional confusion. Per [facility] list, remains on aspirin 325 mg daily and Plavix 75 mg daily -denies side effects.  Atorvastatin since discontinued due to ?leg cramping. CMP completed 8/1 at SNF per report - unable to view results via epic.  Blood pressure today 148/68 - VS monitored weekly which has been stable. BG monitored routinely - stable per pt and daughter report.  No further concerns at this time.  Pertinent imaging  -CT head without contrast with no acute intracranial findings but notable for advanced small vessel white matter disease and encephalomalacia of the right caudate/corona radiata consistent with prior CVA.   -MR brain with acute infarct just a cortical right frontal lobe with subacute infarct left corpus callosum/occipital lobe.   -MRA head/neck with advanced intracranial atherosclerosis with multifocal high-grade narrowing which is progressed from 2021 and chronically diminished flow left vertebral artery due to none resolved proximal stenosis.   -Hemoglobin A1c 6.6, LDL 167.   -TTE with LVEF [id number] grade 1 diastolic dysfunction.   -EEG with diffuse mild encephalopathy nonspecific without seizure or epileptiform discharges.    Update 06/26/2020 JM: Heather [name] returns for stroke follow-up accompanied by her daughter, Sharyn Lull, after prior visit 4 months ago.  She has been stable since prior visit without new stroke/TIA symptoms and reports residual cognitive impairment, LLE  weakness and gait impairment.  She continues to live with her daughter but is able to manage majority of ADLs independently.  Reports compliance with  aspirin 81 mg daily without bleeding or bruising.  Previously on atorvastatin 80 mg daily but recent lab work showed elevated LFTs therefore dosage decreased to 40 mg daily.  Recent lipid panel LDL 101 (down from 125).  Recent A1c 6.3 (down from 6.6).  Blood pressure today elevated at 167/73.  Reports elevated readings at home over the past week.  No further concerns at this time.  Initial visit 02/21/2020 JM: Heather [name] is being seen for hospital follow-up accompanied by her daughter.  Per patient, she does not have any residual deficits or symptoms.  Reports working with home health therapy and when asked what they are working with her on she states " nothing really".  She is ambulating with a rolling walker which she was not using previously.  She is currently residing with her older daughter not present at today's visit.  She was previously living in her own apartment independently. Per daughter, she continues to have cognitive impairment, slurred speech, upper extremity shaking, weakness, hand numbness and gait impairment.  She is able to dress independently but per daughter, has difficulty getting her to bathe routinely.  Denies new or worsening stroke/TIA symptoms.  She has completed 3 weeks DAPT and remains on aspirin alone without bleeding or bruising.  Recently established care with PCP who discontinued Crestor and initiated atorvastatin 80 mg daily.  She appears to be tolerating well without side effects.  Blood pressure today 181/74 and on recheck 170/78.  She reports monitoring at home and similar to today's reading.  They have not been called to complete cardiac event monitor at this time.  No further concerns.  Heather [name] is a 81 y.o. female w/pmh of HTN, HLD, diabetes, medical non compliance who presents with confusion and gait difficulty. Her gait difficulty started on 01/12/20 and confusion started on 01/16/20. She was taken to an OSH on Tuesday where she was diagnosed with a UTI + CTH was  done that revealed and old stroke in her brain. She was sent home on oral abx and presented to [facility] on 01/17/20 due to continued confusion and gait difficulty noted to be swerving to the left side while walking.  Personally reviewed recent hospitalization pertinent progress notes, lab work and imaging with summary provided.  Evaluated by Dr. Erlinda Hong with stroke work-up revealing acute right basal ganglia stroke and small acute stroke of the left internal capsule and lentiform nucleus likely secondary to small vessel disease given uncontrolled risk factors of HTN and HLD and controlled DM.  However, given bilateral nature of her stroke, it was recommended to obtain cardiac event monitor to rule out atrial fibrillation.  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  EF 65 to 70%.  Per note, patient apparently self discontinued all of her medications as she did not like her PCP which she is on the past.  She was started on DAPT for 21 days and then aspirin alone as well as Crestor for LDL 239 with prior history of statin myalgia.  Elevated SBP during admission with known HTN with long-term BP goal normotensive range.  Controlled DM with A1c 6.4 and restarted Metformin at discharge as this was previously thought discontinued.  No prior stroke history.  Residual deficits left facial droop, decreased left hand dexterity, slight weakness of left proximal lower  extremity and cognitive impairment.  She was evaluated by therapy and transferred to rehab center at [facility] for functional decline on 9/13.    ROS:   14 system review of systems performed and negative with exception of those listed in HPI  PMH:  Past Medical History:  Diagnosis Date   Diabetes mellitus without complication ([place])    High cholesterol    History of stroke 2021   Hypertension     PSH: No past surgical history on file.  Social History:  Social History   Socioeconomic History    Marital status: Single    Spouse name: Not on file   Number of children: Not on file   Years of education: Not on file   Highest education level: Not on file  Occupational History   Not on file  Tobacco Use   Smoking status: Never   Smokeless tobacco: Never  Vaping Use   Vaping Use: Never used  Substance and Sexual Activity   Alcohol use: No   Drug use: No   Sexual activity: Never  Other Topics Concern   Not on file  Social History Narrative   Tobacco use, amount per day now: None.   Past tobacco use, amount per day: None.   How many years did you use tobacco: N/A   Alcohol use (drinks per week): None.   Diet:   Do you drink/eat things with caffeine: Coffee.   Marital status: Widowed                                 What year were you married? 1961   Do you live in a house, apartment, assisted living, condo, trailer, etc.? House.   Is it one or more stories? 2   How many persons live in your home? 2   Do you have pets in your home?( please list) Dogs ( 2 )   Highest Level of education completed? 11th Grade.   Current or past profession: [employer].   Do you exercise? No.                                 Type and how often?   Do you have a living will? No   Do you have a DNR form?   No                                If not, do you want to discuss one?   Do you have signed POA/HPOA forms?  No                    If so, please bring to you appointment      Do you have any difficulty bathing or dressing yourself? Yes   Do you have any difficulty preparing food or eating? Yes   Do you have any difficulty managing your medications? Yes   Do you have any difficulty managing your finances? Yes   Do you have any difficulty affording your medications? Yes   Social Determinants of Health   Financial Resource Strain: Not on file  Food Insecurity: Not on file  Transportation Needs: Not on file  Physical Activity: Not on file  Stress: Not on file  Social Connections: Not on file   Intimate Partner Violence:  Not on file    Family History:  Family History  Problem Relation Age of Onset   Stroke Mother    Lung cancer Father    Cancer Daughter    Hypertension Daughter    High Cholesterol Daughter     Medications:   Current Outpatient Medications on File Prior to Visit  Medication Sig Dispense Refill   acetaminophen (TYLENOL) 500 MG tablet Take 500 mg by mouth every 6 (six) hours as needed for mild pain, fever or headache.     amLODipine (NORVASC) 10 MG tablet Take 1 tablet (10 mg total) by mouth daily. 30 tablet    aspirin EC 325 MG EC tablet Take 1 tablet (325 mg total) by mouth daily. 30 tablet 0   fluticasone (FLONASE) 50 MCG/ACT nasal spray Place 1-2 sprays into both nostrils daily as needed for allergies or rhinitis.     losartan (COZAAR) 100 MG tablet Take 1 tablet (100 mg total) by mouth daily. 30 tablet 1   melatonin 3 MG TABS tablet Take 1 tablet (3 mg total) by mouth at bedtime.  0   metoprolol tartrate (LOPRESSOR) 25 MG tablet Take 1 tablet by mouth twice daily 180 tablet 0   Multiple Vitamins-Minerals (ZINC PO) Take 1 tablet by mouth daily.     rosuvastatin (CRESTOR) 40 MG tablet Take 1 tablet (40 mg total) by mouth daily. 30 tablet 5   senna-docusate (SENOKOT-S) 8.6-50 MG tablet Take 1 tablet by mouth at bedtime as needed for moderate constipation. 30 tablet 0   sertraline (ZOLOFT) 25 MG tablet TAKE 1 TABLET BY MOUTH AT BEDTIME 90 tablet 0   traZODone (DESYREL) 50 MG tablet Take 0.5 tablets (25 mg total) by mouth at bedtime. 30 tablet 0   No current facility-administered medications on file prior to visit.    Allergies:   Allergies  Allergen Reactions   Iohexol      Desc: Shaking and sore throat, Onset Date: [id number]    Iohexol Other (See Comments)    Reaction not recalled      OBJECTIVE:  Physical Exam  There were no vitals filed for this visit.   There is no height or weight on file to calculate BMI. No results  found.  General: well developed, well nourished, very pleasant elderly Caucasian female, seated, in no evident distress Head: head normocephalic and atraumatic.   Neck: supple with no carotid or supraclavicular bruits Cardiovascular: regular rate and rhythm, no murmurs Musculoskeletal: no deformity Skin:  no rash/petichiae; BLE edema  Vascular:  Normal pulses all extremities   Neurologic Exam Mental Status: Awake and fully alert.  Fluent speech and language. Oriented to place and time. Recent memory impaired and remote memory intact. Attention span, concentration and fund of knowledge impaired. Mood and affect appropriate MMSE - Mini Mental State Exam 12/26/2020  Orientation to time 2  Orientation to Place 4  Registration 3  Attention/ Calculation 2  Recall 1  Language- name 2 objects 2  Language- repeat 0  Language- follow 3 step command 3  Language- read & follow direction 1  Write a sentence 1  Copy design 1  Total score 20    Cranial Nerves: Pupils equal, briskly reactive to light. Extraocular movements full without nystagmus. Visual fields full to confrontation. Hearing intact. Facial sensation intact. Face, tongue, palate moves normally and symmetrically.  Motor: Normal bulk and tone. Normal strength in all tested extremity muscles  Sensory.: intact to touch , pinprick , position and vibratory sensation  Coordination: Rapid alternating movements normal in all extremities. Finger-to-nose and heel-to-shin performed accurately bilaterally. Gait and Station: Arises from chair without difficulty. Stance is hunched. Gait demonstrates  normal stride length with mild imbalance and use of Rollator walker Reflexes: 1+ and symmetric. Toes downgoing.        ASSESSMENT: Heather [name] is a 81 y.o. year old female with hx of R BG and L IC stroke 01/17/2020 likely secondary to small vessel disease with uncontrolled risk factors and recent R frontal and subacute L CC and occipital stroke  11/16/2020 as well as evidence of progression of intracranial arthrosclerosis with multifocal high-grade narrowing progressed from prior imaging in 2021.  Vascular risk factors include HTN, HLD, DM, and L VA occlusion chronic     PLAN:  Recent R frontal and L CC and occipital lobe strokes:  Hx of R BG and L IC strokes :  Residual deficit: Cognitive impairment and gait impairment.  Stable since recent d/c currently long-term resident of [facility].   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 fib Continue aspirin 325 mg daily and clopidogrel 75 mg daily  for additional 6 weeks then Plavix alone as on aspirin prior to recent stroke Discussed secondary stroke prevention measures and importance of close PCP follow up for aggressive stroke risk factor management  HTN: BP goal <130/90.  Routine monitoring management her husband HLD: LDL goal <70. Recent LDL 167.  Atorvastatin recently DC'd due to myalgias per daughter.  Chronic history of elevated LFTs currently at baseline and due to significantly elevated LDLs and worsening intracranial stenosis, recommend initiating Crestor 40 mg daily request SNF routinely monitor lipid panel and LFTs DMII: A1c goal<7.0. Recent A1c 6.6 currently diet controlled. Stopped metformin due to elevated kidney function Dementia: likey vascular slightly worsened after recent stroke. MMSE today 20/30. Declines interest in Namenda or Aricept.  No behavioral concerns.  Continue to monitor    Follow up in 6 months or call earlier if needed   CC:  Ngetich, Dinah C, NP    I spent 46 minutes of face-to-face and non-face-to-face time with patient and daughter.  This included previsit chart review including review of recent hospitalization, lab review, study review, order entry, electronic health record documentation, patient and daughter  education regarding recent stroke and prior stroke including etiology, residual deficits, secondary stroke prevention measures and importance of managing stroke risk factors, completion and review of MMSE and answered all other questions to patient and daughters satisfaction   Frann Rider, AGNP-BC  [facility] [address] [place], [place] [address]  Phone [phone] Fax [phone] Note: This document was prepared with digital dictation and possible smart phrase technology. Any transcriptional errors that result from this process are unintentional.

## 2021-07-03 ENCOUNTER — Ambulatory Visit: Payer: Medicare HMO | Admitting: Adult Health
